# Patient Record
Sex: Male | Born: 1945 | Race: White | Hispanic: No | Marital: Married | Smoking: Never smoker
Health system: Southern US, Community
[De-identification: ages and names within clinical notes are randomized; demographics above are authoritative.]

## PROBLEM LIST (undated history)

## (undated) DIAGNOSIS — E785 Hyperlipidemia, unspecified: Secondary | ICD-10-CM

## (undated) DIAGNOSIS — E1140 Type 2 diabetes mellitus with diabetic neuropathy, unspecified: Secondary | ICD-10-CM

## (undated) DIAGNOSIS — I219 Acute myocardial infarction, unspecified: Secondary | ICD-10-CM

## (undated) DIAGNOSIS — I10 Essential (primary) hypertension: Secondary | ICD-10-CM

## (undated) DIAGNOSIS — F32A Depression, unspecified: Secondary | ICD-10-CM

## (undated) DIAGNOSIS — I2510 Atherosclerotic heart disease of native coronary artery without angina pectoris: Secondary | ICD-10-CM

## (undated) DIAGNOSIS — E113599 Type 2 diabetes mellitus with proliferative diabetic retinopathy without macular edema, unspecified eye: Secondary | ICD-10-CM

## (undated) DIAGNOSIS — I739 Peripheral vascular disease, unspecified: Secondary | ICD-10-CM

## (undated) DIAGNOSIS — H3320 Serous retinal detachment, unspecified eye: Secondary | ICD-10-CM

## (undated) DIAGNOSIS — Z8619 Personal history of other infectious and parasitic diseases: Secondary | ICD-10-CM

## (undated) DIAGNOSIS — F329 Major depressive disorder, single episode, unspecified: Secondary | ICD-10-CM

## (undated) DIAGNOSIS — M869 Osteomyelitis, unspecified: Secondary | ICD-10-CM

## (undated) DIAGNOSIS — E118 Type 2 diabetes mellitus with unspecified complications: Secondary | ICD-10-CM

## (undated) HISTORY — DX: Depression, unspecified: F32.A

## (undated) HISTORY — PX: AMPUTATION OF REPLICATED TOES: SHX1136

## (undated) HISTORY — DX: Hyperlipidemia, unspecified: E78.5

## (undated) HISTORY — DX: Type 2 diabetes mellitus with diabetic neuropathy, unspecified: E11.40

## (undated) HISTORY — DX: Essential (primary) hypertension: I10

## (undated) HISTORY — DX: Acute myocardial infarction, unspecified: I21.9

## (undated) HISTORY — PX: RETINAL DETACHMENT SURGERY: SHX105

## (undated) HISTORY — PX: CARDIAC CATHETERIZATION: SHX172

## (undated) HISTORY — DX: Type 2 diabetes mellitus with unspecified complications: E11.8

## (undated) HISTORY — DX: Atherosclerotic heart disease of native coronary artery without angina pectoris: I25.10

## (undated) HISTORY — PX: OTHER SURGICAL HISTORY: SHX169

## (undated) HISTORY — DX: Osteomyelitis, unspecified: M86.9

## (undated) HISTORY — DX: Major depressive disorder, single episode, unspecified: F32.9

## (undated) HISTORY — DX: Type 2 diabetes mellitus with proliferative diabetic retinopathy without macular edema, unspecified eye: E11.3599

## (undated) HISTORY — PX: CORONARY ANGIOPLASTY: SHX604

## (undated) HISTORY — DX: Serous retinal detachment, unspecified eye: H33.20

## (undated) HISTORY — DX: Personal history of other infectious and parasitic diseases: Z86.19

## (undated) HISTORY — DX: Peripheral vascular disease, unspecified: I73.9

---

## 1983-05-31 HISTORY — PX: OTHER SURGICAL HISTORY: SHX169

## 1996-05-30 HISTORY — PX: CORONARY ARTERY BYPASS GRAFT: SHX141

## 2003-12-12 ENCOUNTER — Other Ambulatory Visit: Payer: Self-pay

## 2004-05-01 ENCOUNTER — Inpatient Hospital Stay: Payer: Self-pay

## 2004-05-05 ENCOUNTER — Other Ambulatory Visit: Payer: Self-pay

## 2004-05-17 ENCOUNTER — Other Ambulatory Visit: Payer: Self-pay

## 2004-05-17 ENCOUNTER — Inpatient Hospital Stay: Payer: Self-pay | Admitting: Internal Medicine

## 2004-06-22 ENCOUNTER — Ambulatory Visit: Payer: Self-pay | Admitting: Podiatry

## 2004-07-07 ENCOUNTER — Ambulatory Visit: Payer: Self-pay

## 2005-08-01 ENCOUNTER — Ambulatory Visit: Payer: Self-pay | Admitting: Family Medicine

## 2006-03-27 ENCOUNTER — Ambulatory Visit: Payer: Self-pay | Admitting: Internal Medicine

## 2006-04-11 ENCOUNTER — Ambulatory Visit: Payer: Self-pay | Admitting: Internal Medicine

## 2006-05-03 ENCOUNTER — Ambulatory Visit: Payer: Self-pay | Admitting: Internal Medicine

## 2006-05-30 ENCOUNTER — Ambulatory Visit: Payer: Self-pay | Admitting: Internal Medicine

## 2006-07-07 ENCOUNTER — Inpatient Hospital Stay: Payer: Self-pay | Admitting: Internal Medicine

## 2006-07-07 ENCOUNTER — Other Ambulatory Visit: Payer: Self-pay

## 2007-08-13 ENCOUNTER — Other Ambulatory Visit: Payer: Self-pay

## 2007-08-13 ENCOUNTER — Inpatient Hospital Stay: Payer: Self-pay | Admitting: Internal Medicine

## 2009-05-14 ENCOUNTER — Emergency Department: Payer: Self-pay | Admitting: Emergency Medicine

## 2009-12-24 ENCOUNTER — Ambulatory Visit: Payer: Self-pay | Admitting: Podiatry

## 2012-07-18 ENCOUNTER — Other Ambulatory Visit: Payer: Self-pay | Admitting: Podiatry

## 2012-07-22 LAB — WOUND CULTURE

## 2012-09-19 DIAGNOSIS — H35373 Puckering of macula, bilateral: Secondary | ICD-10-CM | POA: Insufficient documentation

## 2012-09-19 DIAGNOSIS — Z961 Presence of intraocular lens: Secondary | ICD-10-CM | POA: Insufficient documentation

## 2013-05-18 ENCOUNTER — Inpatient Hospital Stay: Payer: Self-pay | Admitting: Student

## 2013-05-18 LAB — CBC WITH DIFFERENTIAL/PLATELET
Basophil #: 0.1 10*3/uL (ref 0.0–0.1)
Eosinophil #: 0.1 10*3/uL (ref 0.0–0.7)
MCHC: 32.4 g/dL (ref 32.0–36.0)
Monocyte #: 0.5 x10 3/mm (ref 0.2–1.0)
Neutrophil #: 5.7 10*3/uL (ref 1.4–6.5)
Platelet: 249 10*3/uL (ref 150–440)
RBC: 4.19 10*6/uL — ABNORMAL LOW (ref 4.40–5.90)
RDW: 12.8 % (ref 11.5–14.5)
WBC: 8.4 10*3/uL (ref 3.8–10.6)

## 2013-05-18 LAB — BASIC METABOLIC PANEL
Calcium, Total: 8.5 mg/dL (ref 8.5–10.1)
Chloride: 95 mmol/L — ABNORMAL LOW (ref 98–107)
Co2: 28 mmol/L (ref 21–32)
EGFR (African American): >60
Sodium: 129 mmol/L — ABNORMAL LOW (ref 136–145)

## 2013-05-19 LAB — CBC WITH DIFFERENTIAL/PLATELET
Basophil %: 1 %
Eosinophil #: 0.3 10*3/uL (ref 0.0–0.7)
Eosinophil %: 3.9 %
HCT: 34.9 % — ABNORMAL LOW (ref 40.0–52.0)
HGB: 11.9 g/dL — ABNORMAL LOW (ref 13.0–18.0)
Lymphocyte #: 2.3 10*3/uL (ref 1.0–3.6)
MCH: 29.9 pg (ref 26.0–34.0)
MCHC: 34.1 g/dL (ref 32.0–36.0)
MCV: 88 fL (ref 80–100)
Neutrophil #: 4.5 10*3/uL (ref 1.4–6.5)
Neutrophil %: 57.9 %
Platelet: 234 10*3/uL (ref 150–440)
RBC: 3.98 10*6/uL — ABNORMAL LOW (ref 4.40–5.90)
WBC: 7.8 10*3/uL (ref 3.8–10.6)

## 2013-05-19 LAB — BASIC METABOLIC PANEL
BUN: 18 mg/dL (ref 7–18)
Osmolality: 280 (ref 275–301)
Potassium: 3.4 mmol/L — ABNORMAL LOW (ref 3.5–5.1)
Sodium: 134 mmol/L — ABNORMAL LOW (ref 136–145)

## 2013-05-20 LAB — CBC WITH DIFFERENTIAL/PLATELET
Basophil #: 0.1 10*3/uL (ref 0.0–0.1)
Eosinophil #: 0.4 10*3/uL (ref 0.0–0.7)
Lymphocyte %: 31.2 %
MCH: 29.8 pg (ref 26.0–34.0)
MCHC: 33.6 g/dL (ref 32.0–36.0)
MCV: 89 fL (ref 80–100)
Monocyte #: 0.7 x10 3/mm (ref 0.2–1.0)
Neutrophil #: 4.2 10*3/uL (ref 1.4–6.5)

## 2013-05-20 LAB — BASIC METABOLIC PANEL
Anion Gap: 5 — ABNORMAL LOW (ref 7–16)
Calcium, Total: 8.5 mg/dL (ref 8.5–10.1)
Chloride: 100 mmol/L (ref 98–107)
Co2: 29 mmol/L (ref 21–32)
EGFR (African American): >60
EGFR (Non-African Amer.): >60
Glucose: 341 mg/dL — ABNORMAL HIGH (ref 65–99)
Osmolality: 284 (ref 275–301)
Sodium: 134 mmol/L — ABNORMAL LOW (ref 136–145)

## 2013-05-20 LAB — HEMOGLOBIN A1C: Hemoglobin A1C: 11.5 % — ABNORMAL HIGH (ref 4.2–6.3)

## 2013-05-21 LAB — VANCOMYCIN, TROUGH: Vancomycin, Trough: 12 ug/mL (ref 10–20)

## 2013-05-22 LAB — PATHOLOGY REPORT

## 2013-05-23 LAB — WOUND CULTURE

## 2013-05-23 LAB — CULTURE, BLOOD (SINGLE)

## 2013-05-24 LAB — WOUND CULTURE

## 2013-06-10 ENCOUNTER — Emergency Department: Payer: Self-pay | Admitting: Emergency Medicine

## 2013-06-10 LAB — CBC
HCT: 35.6 % — ABNORMAL LOW (ref 40.0–52.0)
HGB: 12 g/dL — AB (ref 13.0–18.0)
MCH: 29.9 pg (ref 26.0–34.0)
MCHC: 33.6 g/dL (ref 32.0–36.0)
MCV: 89 fL (ref 80–100)
Platelet: 192 10*3/uL (ref 150–440)
RBC: 3.99 10*6/uL — ABNORMAL LOW (ref 4.40–5.90)
RDW: 12.5 % (ref 11.5–14.5)
WBC: 9.8 10*3/uL (ref 3.8–10.6)

## 2013-06-10 LAB — COMPREHENSIVE METABOLIC PANEL
ALBUMIN: 2.9 g/dL — AB (ref 3.4–5.0)
ALK PHOS: 81 U/L
ANION GAP: 7 (ref 7–16)
BILIRUBIN TOTAL: 0.5 mg/dL (ref 0.2–1.0)
BUN: 22 mg/dL — AB (ref 7–18)
CALCIUM: 8.6 mg/dL (ref 8.5–10.1)
Chloride: 102 mmol/L (ref 98–107)
Co2: 25 mmol/L (ref 21–32)
Creatinine: 1.1 mg/dL (ref 0.60–1.30)
EGFR (African American): >60
EGFR (Non-African Amer.): >60
Glucose: 399 mg/dL — ABNORMAL HIGH (ref 65–99)
OSMOLALITY: 288 (ref 275–301)
POTASSIUM: 4.2 mmol/L (ref 3.5–5.1)
SGOT(AST): 15 U/L (ref 15–37)
SGPT (ALT): 17 U/L (ref 12–78)
Sodium: 134 mmol/L — ABNORMAL LOW (ref 136–145)
TOTAL PROTEIN: 7 g/dL (ref 6.4–8.2)

## 2013-06-20 ENCOUNTER — Other Ambulatory Visit: Payer: Self-pay | Admitting: Podiatry

## 2013-06-24 LAB — WOUND CULTURE

## 2013-06-25 ENCOUNTER — Inpatient Hospital Stay: Payer: Self-pay | Admitting: Podiatry

## 2013-06-25 LAB — COMPREHENSIVE METABOLIC PANEL
ANION GAP: 3 — AB (ref 7–16)
AST: 16 U/L (ref 15–37)
Albumin: 2.8 g/dL — ABNORMAL LOW (ref 3.4–5.0)
Alkaline Phosphatase: 119 U/L — ABNORMAL HIGH
BILIRUBIN TOTAL: 0.2 mg/dL (ref 0.2–1.0)
BUN: 27 mg/dL — ABNORMAL HIGH (ref 7–18)
CALCIUM: 8.6 mg/dL (ref 8.5–10.1)
CREATININE: 1.66 mg/dL — AB (ref 0.60–1.30)
Chloride: 95 mmol/L — ABNORMAL LOW (ref 98–107)
Co2: 29 mmol/L (ref 21–32)
EGFR (African American): 49 — ABNORMAL LOW
EGFR (Non-African Amer.): 42 — ABNORMAL LOW
GLUCOSE: 373 mg/dL — AB (ref 65–99)
Osmolality: 276 (ref 275–301)
Potassium: 4.2 mmol/L (ref 3.5–5.1)
SGPT (ALT): 11 U/L — ABNORMAL LOW (ref 12–78)
Sodium: 127 mmol/L — ABNORMAL LOW (ref 136–145)
Total Protein: 7.4 g/dL (ref 6.4–8.2)

## 2013-06-25 LAB — CBC WITH DIFFERENTIAL/PLATELET
BASOS ABS: 0.1 10*3/uL (ref 0.0–0.1)
BASOS PCT: 0.7 %
Eosinophil #: 0.5 10*3/uL (ref 0.0–0.7)
Eosinophil %: 6.6 %
HCT: 33 % — ABNORMAL LOW (ref 40.0–52.0)
HGB: 11.3 g/dL — AB (ref 13.0–18.0)
Lymphocyte #: 2.2 10*3/uL (ref 1.0–3.6)
Lymphocyte %: 27.4 %
MCH: 30.4 pg (ref 26.0–34.0)
MCHC: 34.1 g/dL (ref 32.0–36.0)
MCV: 89 fL (ref 80–100)
MONOS PCT: 8.1 %
Monocyte #: 0.6 x10 3/mm (ref 0.2–1.0)
Neutrophil #: 4.6 10*3/uL (ref 1.4–6.5)
Neutrophil %: 57.2 %
PLATELETS: 384 10*3/uL (ref 150–440)
RBC: 3.7 10*6/uL — ABNORMAL LOW (ref 4.40–5.90)
RDW: 12.5 % (ref 11.5–14.5)
WBC: 8 10*3/uL (ref 3.8–10.6)

## 2013-06-25 LAB — PROTIME-INR
INR: 0.9
PROTHROMBIN TIME: 12.4 s (ref 11.5–14.7)

## 2013-06-25 LAB — APTT: Activated PTT: 33.5 secs (ref 23.6–35.9)

## 2013-06-25 LAB — HEMOGLOBIN A1C: Hemoglobin A1C: 10.7 % — ABNORMAL HIGH (ref 4.2–6.3)

## 2013-06-30 LAB — WOUND CULTURE

## 2013-06-30 LAB — CULTURE, BLOOD (SINGLE)

## 2013-07-09 ENCOUNTER — Telehealth: Payer: Self-pay | Admitting: Adult Health

## 2013-07-09 NOTE — Telephone Encounter (Signed)
Patient Information:  Caller Name: Grover Canavan  Phone: [phone]  Patient: Todd Clay, Todd Clay  Gender: Male  DOB: 1945-07-26  Age: 68 Years  PCP: Charolette Forward  Office Follow Up:  Does the office need to follow up with this patient?: Yes  Instructions For The Office: Please review - contact wife/Cathera at cell  [phone] as soon as possible with Appt time or instructions for care. According to office instructions:  New or transferring patients: CAN (may not) triage new or transferring pts who have not been seen in the office yet. Transferring pts from another practice (must) be seen in the office first before CAN may triage.   If pt has a new pt appt scheduled and is calling for an acute need, we may triage and schedule acute appt.  RN Note:  Disposition:  Go to ER now or to office with PCP approval. Patient says he feels so bad right now, just wanting to stay in bed - would rather have Appt in am Wed 2/11 but will bring him in of possible or will take him to ER if need be.  Symptoms  Reason For Call & Symptoms: Is changing PMDs - has Aopt 08/13/2013 for initial evaluation. But patient very ill now.  Had Left first toe after big toe amputated 05/19/2013.  Did well at first but in hospital again in mid-Jan 2015 for debriedment due to not healing.  Also has 2 fractured bones in Left foot and had Xrays today.  Has severe infection in Left foot.  Hx of heart problems, diabetes without glucose control - BS ranging so high would not register on monitor in the last 2 weeks or so low 50s.  Wanting help with new PMD to help with his care. Has vomited twice this am,  Blood sugar at 10 am was 80 after breakfast. Wife concerned he may die before she can get him help.  Reviewed Health History In EMR: Yes  Reviewed Medications In EMR: Yes  Reviewed Allergies In EMR: Yes  Reviewed Surgeries / Procedures: Yes  Date of Onset of Symptoms: 05/31/2013  Treatments Tried: surgeries listed above.  Treatments Tried  Worked: No  Guideline(s) Used:  Diabetes - Low Blood Sugar  Disposition Per Guideline:   Go to ED Now (or to Office with PCP Approval)  Reason For Disposition Reached:   Patient sounds very sick or weak to the triager  Advice Given:  N/A  Patient Will Follow Care Advice:  YES

## 2013-07-09 NOTE — Telephone Encounter (Signed)
Spoke with wife, advised to take pt to ED.

## 2013-07-24 ENCOUNTER — Inpatient Hospital Stay: Payer: Self-pay | Admitting: Internal Medicine

## 2013-07-24 LAB — CBC WITH DIFFERENTIAL/PLATELET
BASOS ABS: 0.1 10*3/uL (ref 0.0–0.1)
BASOS PCT: 0.9 %
Eosinophil #: 0.3 10*3/uL (ref 0.0–0.7)
Eosinophil %: 4.3 %
HCT: 35.7 % — AB (ref 40.0–52.0)
HGB: 11.9 g/dL — AB (ref 13.0–18.0)
LYMPHS ABS: 1.9 10*3/uL (ref 1.0–3.6)
LYMPHS PCT: 25.2 %
MCH: 29.7 pg (ref 26.0–34.0)
MCHC: 33.3 g/dL (ref 32.0–36.0)
MCV: 89 fL (ref 80–100)
MONO ABS: 0.4 x10 3/mm (ref 0.2–1.0)
Monocyte %: 5.5 %
Neutrophil #: 4.9 10*3/uL (ref 1.4–6.5)
Neutrophil %: 64.1 %
Platelet: 230 10*3/uL (ref 150–440)
RBC: 4.01 10*6/uL — ABNORMAL LOW (ref 4.40–5.90)
RDW: 13.8 % (ref 11.5–14.5)
WBC: 7.6 10*3/uL (ref 3.8–10.6)

## 2013-07-24 LAB — BASIC METABOLIC PANEL
Anion Gap: 5 — ABNORMAL LOW (ref 7–16)
BUN: 25 mg/dL — ABNORMAL HIGH (ref 7–18)
CHLORIDE: 99 mmol/L (ref 98–107)
Calcium, Total: 8.8 mg/dL (ref 8.5–10.1)
Co2: 27 mmol/L (ref 21–32)
Creatinine: 1.47 mg/dL — ABNORMAL HIGH (ref 0.60–1.30)
GFR CALC AF AMER: 56 — AB
GFR CALC NON AF AMER: 49 — AB
Glucose: 257 mg/dL — ABNORMAL HIGH (ref 65–99)
Osmolality: 276 (ref 275–301)
POTASSIUM: 5.1 mmol/L (ref 3.5–5.1)
Sodium: 131 mmol/L — ABNORMAL LOW (ref 136–145)

## 2013-07-25 LAB — BASIC METABOLIC PANEL
Anion Gap: 4 — ABNORMAL LOW (ref 7–16)
BUN: 21 mg/dL — ABNORMAL HIGH (ref 7–18)
CALCIUM: 8.4 mg/dL — AB (ref 8.5–10.1)
CHLORIDE: 101 mmol/L (ref 98–107)
CO2: 30 mmol/L (ref 21–32)
Creatinine: 1.43 mg/dL — ABNORMAL HIGH (ref 0.60–1.30)
GFR CALC AF AMER: 58 — AB
GFR CALC NON AF AMER: 50 — AB
GLUCOSE: 176 mg/dL — AB (ref 65–99)
OSMOLALITY: 277 (ref 275–301)
Potassium: 4.1 mmol/L (ref 3.5–5.1)
Sodium: 135 mmol/L — ABNORMAL LOW (ref 136–145)

## 2013-07-25 LAB — CBC WITH DIFFERENTIAL/PLATELET
BASOS ABS: 0 10*3/uL (ref 0.0–0.1)
Basophil %: 0.6 %
Eosinophil #: 0.5 10*3/uL (ref 0.0–0.7)
Eosinophil %: 7.5 %
HCT: 32.8 % — ABNORMAL LOW (ref 40.0–52.0)
HGB: 11.1 g/dL — AB (ref 13.0–18.0)
Lymphocyte #: 1.9 10*3/uL (ref 1.0–3.6)
Lymphocyte %: 27.3 %
MCH: 29.9 pg (ref 26.0–34.0)
MCHC: 33.8 g/dL (ref 32.0–36.0)
MCV: 88 fL (ref 80–100)
MONOS PCT: 6.6 %
Monocyte #: 0.5 x10 3/mm (ref 0.2–1.0)
Neutrophil #: 4 10*3/uL (ref 1.4–6.5)
Neutrophil %: 58 %
PLATELETS: 197 10*3/uL (ref 150–440)
RBC: 3.71 10*6/uL — AB (ref 4.40–5.90)
RDW: 13.5 % (ref 11.5–14.5)
WBC: 6.9 10*3/uL (ref 3.8–10.6)

## 2013-07-25 LAB — SEDIMENTATION RATE: Erythrocyte Sed Rate: 38 mm/hr — ABNORMAL HIGH (ref 0–20)

## 2013-07-28 LAB — WOUND CULTURE

## 2013-07-29 LAB — PATHOLOGY REPORT

## 2013-07-29 LAB — CULTURE, BLOOD (SINGLE)

## 2013-08-13 ENCOUNTER — Ambulatory Visit (INDEPENDENT_AMBULATORY_CARE_PROVIDER_SITE_OTHER): Payer: Commercial Managed Care - HMO | Admitting: Adult Health

## 2013-08-13 ENCOUNTER — Encounter: Payer: Self-pay | Admitting: Adult Health

## 2013-08-13 ENCOUNTER — Encounter (INDEPENDENT_AMBULATORY_CARE_PROVIDER_SITE_OTHER): Payer: Self-pay

## 2013-08-13 ENCOUNTER — Other Ambulatory Visit: Payer: Self-pay | Admitting: *Deleted

## 2013-08-13 VITALS — BP 96/59 | HR 64 | Temp 97.7°F | Resp 14 | Ht 67.25 in | Wt 179.0 lb

## 2013-08-13 DIAGNOSIS — I2510 Atherosclerotic heart disease of native coronary artery without angina pectoris: Secondary | ICD-10-CM

## 2013-08-13 DIAGNOSIS — R5383 Other fatigue: Secondary | ICD-10-CM

## 2013-08-13 DIAGNOSIS — E118 Type 2 diabetes mellitus with unspecified complications: Secondary | ICD-10-CM

## 2013-08-13 DIAGNOSIS — Z1211 Encounter for screening for malignant neoplasm of colon: Secondary | ICD-10-CM | POA: Insufficient documentation

## 2013-08-13 DIAGNOSIS — E1065 Type 1 diabetes mellitus with hyperglycemia: Secondary | ICD-10-CM | POA: Insufficient documentation

## 2013-08-13 DIAGNOSIS — R5381 Other malaise: Secondary | ICD-10-CM

## 2013-08-13 DIAGNOSIS — IMO0002 Reserved for concepts with insufficient information to code with codable children: Secondary | ICD-10-CM | POA: Insufficient documentation

## 2013-08-13 DIAGNOSIS — Z7689 Persons encountering health services in other specified circumstances: Secondary | ICD-10-CM | POA: Insufficient documentation

## 2013-08-13 DIAGNOSIS — Z7189 Other specified counseling: Secondary | ICD-10-CM

## 2013-08-13 MED ORDER — SIMVASTATIN 80 MG PO TABS: 80.0000 mg | ORAL_TABLET | Freq: Every day | ORAL | Status: DC

## 2013-08-13 MED ORDER — INSULIN GLARGINE 100 UNIT/ML ~~LOC~~ SOLN: 40.0000 [IU] | Freq: Every day | SUBCUTANEOUS | Status: DC

## 2013-08-13 MED ORDER — METOPROLOL TARTRATE 25 MG PO TABS: 25.0000 mg | ORAL_TABLET | Freq: Two times a day (BID) | ORAL | Status: DC

## 2013-08-13 MED ORDER — CLOPIDOGREL BISULFATE 75 MG PO TABS: 75.0000 mg | ORAL_TABLET | Freq: Every day | ORAL | Status: DC

## 2013-08-13 MED ORDER — TORSEMIDE 20 MG PO TABS: 20.0000 mg | ORAL_TABLET | Freq: Every day | ORAL | Status: DC

## 2013-08-13 MED ORDER — POTASSIUM CHLORIDE CRYS ER 20 MEQ PO TBCR: 20.0000 meq | EXTENDED_RELEASE_TABLET | Freq: Once | ORAL | Status: DC

## 2013-08-13 MED ORDER — GABAPENTIN 300 MG PO CAPS: 900.0000 mg | ORAL_CAPSULE | Freq: Three times a day (TID) | ORAL | Status: DC

## 2013-08-13 MED ORDER — POTASSIUM CHLORIDE CRYS ER 20 MEQ PO TBCR: 20.0000 meq | EXTENDED_RELEASE_TABLET | Freq: Every day | ORAL | Status: DC

## 2013-08-13 NOTE — Progress Notes (Signed)
Pre visit review using our clinic review tool, if applicable. No additional management support is needed unless otherwise documented below in the visit note. 

## 2013-08-13 NOTE — Telephone Encounter (Signed)
Pharmacy Note:  Clarify directions for Potassium Chloride SA

## 2013-08-13 NOTE — Progress Notes (Signed)
Patient ID: Todd Clay, male   DOB: Jul 24, 1945, 68 y.o.   MRN: [id number]    Subjective:    Patient ID: Todd Clay, male    DOB: 1945/09/17, 68 y.o.   MRN: [id number]  HPI  Pt is a pleasant 68 y/o male who presents to clinic to establish care. Pt has multiple medical problems which are listed below including DM type 2 with complications, diabetic neuropathy, HTN, HLD, CAD s/p CABG x 1 ([facility] by Dr. Evelina Dun), MI x 3 who presents to clinic to establish care. He was previously followed by Dr. Lavera Guise. Will request records. Pt reports that he does not have a local cardiologist. Recent surgery for osteomyelitis of left 2nd and 3rd metatarsal s/p amputation. He is followed by Dr. Vickki Muff and Dr. Ola Spurr (ID). Pt is currently being followed by [facility] for IV antibiotics. He has a PICC line in his right arm. He is receiving Ertapenem 1g IV q24h.    Past Medical History  Diagnosis Date  . History of chickenpox   . Diabetes mellitus with complication   . CAD (coronary artery disease)   . Retinal detachment     OD  . Osteomyelitis     Left 2nd and 3rd metatarsal  . Diabetic neuropathy   . PVD (peripheral vascular disease)   . HTN (hypertension)   . HLD (hyperlipidemia)   . Depression   . Proliferative diabetic retinopathy     OU  . MI (myocardial infarction)     x3     Past Surgical History  Procedure Laterality Date  . Coronary artery bypass graft  1998    x1 vessel  . 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 Stents      x 5   . Retinal detachment surgery      Laser  . Vitreous retinal surgery  1985    Laser tx OU     Family History  Problem Relation Age of Onset  . Kidney disease Mother     Kidney failure  . Heart disease Father     CAD  . Cancer Brother     Head & Neck Tumor     History   Social History  . Marital Status: Married    Spouse Name: N/A    Number of Children: N/A  . Years of Education: N/A   Occupational  History  . Not on file.   Social History Main Topics  . Smoking status: Never Smoker   . Smokeless tobacco: Not on file  . Alcohol Use: No  . Drug Use: No  . Sexual Activity: Not on file   Other Topics Concern  . Not on file   Social History Narrative  . No narrative on file     Review of Systems  Constitutional: Positive for fatigue.  HENT: Negative.   Eyes: Negative.   Respiratory: Negative.   Cardiovascular: Negative.   Gastrointestinal: Negative.   Endocrine: Negative.   Genitourinary: Negative.   Musculoskeletal: Negative.        Amputation of left 2nd and 3rd metatarsal.   Skin: Negative.   Allergic/Immunologic: Negative.   Neurological: Positive for numbness (tingling - currently on gabapentin).  Hematological: Negative.   Psychiatric/Behavioral: Negative.        Objective:  There were no vitals taken for this visit.   Physical Exam  Constitutional: He is oriented to person, place, and time. He appears well-developed and well-nourished. No  distress.  HENT:  Head: Normocephalic and atraumatic.  Eyes: Conjunctivae and EOM are normal. Pupils are equal, round, and reactive to light.  Neck: Normal range of motion. Neck supple.  Cardiovascular: Normal rate, regular rhythm, normal heart sounds and intact distal pulses.  Exam reveals no gallop and no friction rub.   No murmur heard. Pulmonary/Chest: Effort normal and breath sounds normal. No respiratory distress. He has no wheezes. He has no rales.  Musculoskeletal: Normal range of motion. He exhibits no edema and no tenderness.  Ambulating with walker  Neurological: He is alert and oriented to person, place, and time. He has normal reflexes. Coordination normal.  Skin: Skin is warm and dry.  Psychiatric: He has a normal mood and affect. His behavior is normal. Judgment and thought content normal.       Assessment & Plan:   1. Diabetes mellitus with complication Insulin dependent. Refill medication. Check  blood glucose bid. AM readings around 100. PM readings around 203-210. Takes regular insulin for blood glucose above 200. Usually 3 units. Retinopathy followed by Opthalmology yearly. Diabetic neuropathy controlled on gabapentin. Pt had extensive lab work done while hospitalized. Will request labs. Want to see how his kidney function is. He reports they told him in the hospital it was "slightly elevated". Review. May need referral to Nephrology.  2. Encounter to establish care Reviewed medical, surgical, family, social hx with pt. Request records from previous PCP. Request recent labs.  3. CAD (coronary artery disease) Pt is s/p MI x 3, CABG x 1 at [facility], stents x 5. Refer to local cardiologist. Will refer to Dr. Fletcher Anon or Dr. Candis Musa  - Ambulatory referral to Cardiology  4. Fatigue Multifactorial given recent events - osteomyelitis, surgery, medical problems. B/P is on the low side. May need to decrease metoprolol slightly. Will wait for input from cardiology. Request recent labs to review. ?Anemia, thyroid.

## 2013-08-16 ENCOUNTER — Encounter: Payer: Self-pay | Admitting: Emergency Medicine

## 2013-08-21 ENCOUNTER — Telehealth: Payer: Self-pay | Admitting: *Deleted

## 2013-08-21 NOTE — Telephone Encounter (Signed)
Called pt to inquire about blood sugar readings, pt stated that they are low in the morning and high in the evenings.  Yesterday morning was 89, Pt stated that an average number for the evenings is sometimes more than 200.  Pt stated that he doesn't feel like keeping a log.

## 2013-08-22 ENCOUNTER — Telehealth: Payer: Self-pay

## 2013-08-22 NOTE — Telephone Encounter (Signed)
Relevant patient education assigned to patient using Emmi. ° °

## 2013-08-26 ENCOUNTER — Ambulatory Visit (INDEPENDENT_AMBULATORY_CARE_PROVIDER_SITE_OTHER): Payer: Commercial Managed Care - HMO | Admitting: Cardiovascular Disease

## 2013-08-26 ENCOUNTER — Encounter: Payer: Self-pay | Admitting: Cardiovascular Disease

## 2013-08-26 VITALS — BP 92/58 | HR 83 | Ht 68.0 in | Wt 178.5 lb

## 2013-08-26 DIAGNOSIS — I2510 Atherosclerotic heart disease of native coronary artery without angina pectoris: Secondary | ICD-10-CM

## 2013-08-26 DIAGNOSIS — I951 Orthostatic hypotension: Secondary | ICD-10-CM

## 2013-08-26 DIAGNOSIS — E118 Type 2 diabetes mellitus with unspecified complications: Secondary | ICD-10-CM

## 2013-08-26 DIAGNOSIS — Z951 Presence of aortocoronary bypass graft: Secondary | ICD-10-CM | POA: Insufficient documentation

## 2013-08-26 DIAGNOSIS — E785 Hyperlipidemia, unspecified: Secondary | ICD-10-CM

## 2013-08-26 DIAGNOSIS — R5383 Other fatigue: Secondary | ICD-10-CM

## 2013-08-26 DIAGNOSIS — R5381 Other malaise: Secondary | ICD-10-CM

## 2013-08-26 NOTE — Assessment & Plan Note (Signed)
We have encouraged continued  careful diet management, medication management with primary care. He does report recent low sugars. Perhaps need a lower dose insulin in the morning

## 2013-08-26 NOTE — Progress Notes (Signed)
Patient ID: Todd Clay, male    DOB: [birth year], 68 y.o.   MRN: [id number]  HPI Comments: Todd Clay is a pleasant 68 year old gentleman with history of CAD, bypass surgery December 1997 with LIMA to the LAD at [facility], several catheterizations since that time with stents placed, stent to the left circumflex and RCA in 2005, history of poorly controlled diabetes, with PAD, recent toe indications (staph) for ostiomyelitis requiring PICC line and long-term antibiotics, who presents to establish care in our clinic.   He denies any active chest pain or shortness of breath on today's visit. He has been relatively sedentary given his healing amputation of his toe. He reports that in the past he has not had significant chest pain prior to stent placement. He reports the blockages were found but he does not remember how. He does feel washed out, denies any lightheadedness or dizziness. Just feels very tired. He reports that this morning his sugars were very low. He is trying to eat better eared sugars were in the 30s.  Previously seen by Dr. Ola Spurr for infectious disease  In the office today blood pressures running low. Also running low when recently seen by primary care, systolics in the 23J. He is taking Augmentin now for his foot/toe.   Cardiac catheterization in 2008 at [facility] showed 99% proximal RCA disease with endeavor DES placed 2.5 x 15 mm, also with 60% distal RCA disease, 100% OM1 disease followed by 50%, 80% proximal LAD disease, 100% followed by 90% mid LAD disease, 100% diagonal disease Normal ejection fraction at that time estimated greater than 60%  Prior cardiac catheterization in July 2005 details 60% proximal RCA followed by 50%, 90% and 70% distal RCA disease 90% mid left circumflex disease, 90% OM 3 disease, 70% proximal LAD disease extending to the mid vessel, 100% mid LAD disease, 70% distal LAD disease  Cardiac catheterization July 2000 with 70% proximal RCA disease, 90%  mid RCA disease, 70% distal RCA disease, 70% proximal left circumflex disease, 70% OM1 disease, 100% OM 2 disease, 70% proximal LAD to mid LAD disease, 70% mid and distal LAD disease, 100% diagonal disease   Recent lab work in the hospital 07/26/2013 shows creatinine 1.43, BUN 21, potassium 4.1  EKG today shows normal sinus rhythm with rate 83 beats per minute, bigeminal pattern with PVCs, ST and T wave abnormality through the anterior precordial leads, inferior leads   Outpatient Encounter Prescriptions as of 08/26/2013  Medication Sig  . amoxicillin-clavulanate (AUGMENTIN) 875-125 MG per tablet Take 1 tablet by mouth 2 (two) times daily.  Marland Kitchen aspirin 325 MG tablet Take 325 mg by mouth daily.  . clopidogrel (PLAVIX) 75 MG tablet Take 1 tablet (75 mg total) by mouth daily with breakfast.  . gabapentin (NEURONTIN) 300 MG capsule Take 3 capsules (900 mg total) by mouth 3 (three) times daily.  . insulin glargine (LANTUS) 100 UNIT/ML injection Inject 0.4 mLs (40 Units total) into the skin daily.  . insulin regular (NOVOLIN R,HUMULIN R) 100 units/mL injection 3 Units at bedtime as needed for high blood sugar.  . metoprolol tartrate (LOPRESSOR) 25 MG tablet Take 1 tablet (25 mg total) by mouth 2 (two) times daily.  . potassium chloride SA (K-DUR,KLOR-CON) 20 MEQ tablet Take 1 tablet (20 mEq total) by mouth daily.  . simvastatin (ZOCOR) 80 MG tablet Take 1 tablet (80 mg total) by mouth daily at 6 PM.  . torsemide (DEMADEX) 20 MG tablet Take 1 tablet (20 mg total) by mouth  daily.    Review of Systems  Constitutional: Negative.   HENT: Negative.   Eyes: Negative.   Respiratory: Negative.   Cardiovascular: Negative.   Gastrointestinal: Negative.   Endocrine: Negative.   Musculoskeletal: Negative.   Skin: Negative.   Allergic/Immunologic: Negative.   Neurological: Negative.   Hematological: Negative.   Psychiatric/Behavioral: Negative.   All other systems reviewed and are negative.    BP  92/58  Pulse 83  Ht 5\' 8"  (1.727 m)  Wt 178 lb 8 oz (80.967 kg)  BMI 27.15 kg/m2  Physical Exam  Nursing note and vitals reviewed. Constitutional: He is oriented to person, place, and time. He appears well-developed and well-nourished.  HENT:  Head: Normocephalic.  Nose: Nose normal.  Mouth/Throat: Oropharynx is clear and moist.  Eyes: Conjunctivae are normal. Pupils are equal, round, and reactive to light.  Neck: Normal range of motion. Neck supple. No JVD present.  Cardiovascular: Normal rate, regular rhythm, S1 normal, S2 normal, normal heart sounds and intact distal pulses.  Exam reveals no gallop and no friction rub.   No murmur heard. Pulses:      Carotid pulses are 2+ on the right side, and 2+ on the left side.      Radial pulses are 1+ on the right side, and 1+ on the left side.       Dorsalis pedis pulses are 0 on the right side, and 0 on the left side.       Posterior tibial pulses are 0 on the right side, and 0 on the left side.  Pulmonary/Chest: Effort normal and breath sounds normal. No respiratory distress. He has no wheezes. He has no rales. He exhibits no tenderness.  Abdominal: Soft. Bowel sounds are normal. He exhibits no distension. There is no tenderness.  Musculoskeletal: Normal range of motion. He exhibits no edema and no tenderness.  Lymphadenopathy:    He has no cervical adenopathy.  Neurological: He is alert and oriented to person, place, and time. Coordination normal.  Skin: Skin is warm and dry. No rash noted. No erythema.  Psychiatric: He has a normal mood and affect. His behavior is normal. Judgment and thought content normal.      Assessment and Plan

## 2013-08-26 NOTE — Patient Instructions (Signed)
Blood pressure is low Please take torsemide and potassium every other day  If blood pressures continue to run low,  Cut the metoprolol in 1/2 twice a day  Please call us if you have new issues that need to be addressed before your next appt.  Your physician wants you to follow-up in: 3 month.

## 2013-08-26 NOTE — Assessment & Plan Note (Addendum)
[place] to the LAD in 1997 per the notes from [place]. Numerous stents since that time take 3 to the RCA distal, proximal RCA, left circumflex

## 2013-08-26 NOTE — Assessment & Plan Note (Signed)
Recent symptoms of fatigue. Uncertain if he has recurrent infection though he reports his toe is healing well. Blood pressure is low today. Uncertain if this is secondary to peripheral vascular disease. Recent blood work in February 2015 appeared mildly dehydrated. He is uncertain why he is on torsemide daily, denies having leg edema or fluid retention. No recent echocardiogram to evaluate EF. We have suggested he take torsemide with potassium every other day. He does not want echocardiogram at this time, does not want repeat lab work.

## 2013-08-26 NOTE — Assessment & Plan Note (Signed)
We'll try to obtain his most recent lipid panel. Goal LDL less than 70. Stressed the importance of good diabetes control

## 2013-08-26 NOTE — Assessment & Plan Note (Addendum)
Prior records requested, reviewed. Surgical report from 1997 reviewed, 3 cardiac catheterizations reviewed including interventions detailed. Currently with no symptoms of angina. No further workup at this time. Continue current medication regimen. If he has any symptoms of shortness of breath or chest pain, would schedule a lexiscan Myoview.

## 2013-08-26 NOTE — Assessment & Plan Note (Signed)
We have recommended he take torsemide with potassium every other day. I'm concerned about mild dehydration. Given his low blood pressure, this does raise the concern of recurrent infection. Suggested his wife closely track his blood pressure at home. Unable to exclude peripheral arterial disease as cause of his low blood pressure. If no improvement in his numbers, ultrasounds of his subclavian arteries could be performed.

## 2013-11-11 ENCOUNTER — Telehealth: Payer: Self-pay

## 2013-11-11 NOTE — Telephone Encounter (Signed)
Pt wife called and states on Sat pt "couldnt stand up, just started sliding down, and happened again on Sunday". States his blood sugar was over 300, states his BP on Sunday 136/76 on right arm, left arm would not register. Please call.

## 2013-11-11 NOTE — Telephone Encounter (Signed)
Spoke w/ Grover Canavan.  She reports that pt went out to eat on Sat and "felt funny" when he was getting out of the car. She states that she practically had to drag him to his seat.  She thought it was his BG, she ordered him a glass of sweet tea, which sent his BG >300. States that he had a similar episode yesterday.  BP right arm 136/76, unable to obtain pressure in left arm.  She states that she had an electronic meter, her sister in law is a [employer] scientist and she was able to palpate pulse in wrist, but did not have stethoscope to obtain manual BP.  She is concerned, as pt has h/o "silent MIs" and she read that the discrepancy in BP can be r/t his heart.  Offered pt appt to come in for nurse visit for EKG and BP check, or to see Dr. Rockey Situ or Ignacia Bayley, NP. Advised her to call 911 or go to nearest ED if she is concerned about pt's sx.  She reports that she would prefer for pt to keep appt 11/18/13 w/ Dr. Rockey Situ, but will call if pt needs assistance before that time.

## 2013-11-18 ENCOUNTER — Ambulatory Visit (INDEPENDENT_AMBULATORY_CARE_PROVIDER_SITE_OTHER): Payer: Commercial Managed Care - HMO | Admitting: Adult Health

## 2013-11-18 ENCOUNTER — Encounter: Payer: Self-pay | Admitting: Adult Health

## 2013-11-18 ENCOUNTER — Ambulatory Visit (INDEPENDENT_AMBULATORY_CARE_PROVIDER_SITE_OTHER): Payer: Commercial Managed Care - HMO | Admitting: Cardiovascular Disease

## 2013-11-18 ENCOUNTER — Encounter: Payer: Self-pay | Admitting: Cardiovascular Disease

## 2013-11-18 VITALS — BP 120/60 | HR 83 | Ht 68.0 in | Wt 178.0 lb

## 2013-11-18 VITALS — BP 106/66 | HR 77 | Temp 98.4°F | Resp 14 | Wt 177.5 lb

## 2013-11-18 DIAGNOSIS — Z951 Presence of aortocoronary bypass graft: Secondary | ICD-10-CM

## 2013-11-18 DIAGNOSIS — IMO0002 Reserved for concepts with insufficient information to code with codable children: Secondary | ICD-10-CM

## 2013-11-18 DIAGNOSIS — I25810 Atherosclerosis of coronary artery bypass graft(s) without angina pectoris: Secondary | ICD-10-CM

## 2013-11-18 DIAGNOSIS — IMO0001 Reserved for inherently not codable concepts without codable children: Secondary | ICD-10-CM

## 2013-11-18 DIAGNOSIS — E785 Hyperlipidemia, unspecified: Secondary | ICD-10-CM

## 2013-11-18 DIAGNOSIS — E1165 Type 2 diabetes mellitus with hyperglycemia: Secondary | ICD-10-CM

## 2013-11-18 DIAGNOSIS — I951 Orthostatic hypotension: Secondary | ICD-10-CM

## 2013-11-18 DIAGNOSIS — Z8739 Personal history of other diseases of the musculoskeletal system and connective tissue: Secondary | ICD-10-CM

## 2013-11-18 DIAGNOSIS — E118 Type 2 diabetes mellitus with unspecified complications: Secondary | ICD-10-CM

## 2013-11-18 LAB — COMPREHENSIVE METABOLIC PANEL
ALT: 15 U/L (ref 0–53)
AST: 22 U/L (ref 0–37)
Albumin: 4 g/dL (ref 3.5–5.2)
Alkaline Phosphatase: 77 U/L (ref 39–117)
BILIRUBIN TOTAL: 0.5 mg/dL (ref 0.2–1.2)
BUN: 20 mg/dL (ref 6–23)
CALCIUM: 9 mg/dL (ref 8.4–10.5)
CHLORIDE: 100 meq/L (ref 96–112)
CO2: 30 meq/L (ref 19–32)
Creatinine, Ser: 1.1 mg/dL (ref 0.4–1.5)
GFR: 73.13 mL/min (ref >60.00)
GLUCOSE: 125 mg/dL — AB (ref 70–99)
Potassium: 4.4 mEq/L (ref 3.5–5.1)
SODIUM: 138 meq/L (ref 135–145)
TOTAL PROTEIN: 7.2 g/dL (ref 6.0–8.3)

## 2013-11-18 LAB — CBC WITH DIFFERENTIAL/PLATELET
BASOS ABS: 0 10*3/uL (ref 0.0–0.1)
Basophils Relative: 0.3 % (ref 0.0–3.0)
EOS PCT: 4.3 % (ref 0.0–5.0)
Eosinophils Absolute: 0.3 10*3/uL (ref 0.0–0.7)
HEMATOCRIT: 40.9 % (ref 39.0–52.0)
Hemoglobin: 13.7 g/dL (ref 13.0–17.0)
LYMPHS ABS: 3.2 10*3/uL (ref 0.7–4.0)
LYMPHS PCT: 42.5 % (ref 12.0–46.0)
MCHC: 33.5 g/dL (ref 30.0–36.0)
MCV: 89.7 fl (ref 78.0–100.0)
MONOS PCT: 2.1 % — AB (ref 3.0–12.0)
Monocytes Absolute: 0.2 10*3/uL (ref 0.1–1.0)
Neutro Abs: 3.8 10*3/uL (ref 1.4–7.7)
Neutrophils Relative %: 50.8 % (ref 43.0–77.0)
Platelets: 268 10*3/uL (ref 150.0–400.0)
RBC: 4.56 Mil/uL (ref 4.22–5.81)
RDW: 13.2 % (ref 11.5–15.5)
WBC: 7.5 10*3/uL (ref 4.0–10.5)

## 2013-11-18 LAB — MICROALBUMIN / CREATININE URINE RATIO
Creatinine,U: 52.7 mg/dL
Microalb Creat Ratio: 3.8 mg/g (ref 0.0–30.0)
Microalb, Ur: 2 mg/dL — ABNORMAL HIGH (ref 0.0–1.9)

## 2013-11-18 LAB — HEMOGLOBIN A1C: Hgb A1c MFr Bld: 10.4 % — ABNORMAL HIGH (ref 4.6–6.5)

## 2013-11-18 NOTE — Assessment & Plan Note (Signed)
Currently with no symptoms of angina. No further workup at this time. Continue current medication regimen. 

## 2013-11-18 NOTE — Assessment & Plan Note (Signed)
We have encouraged continued exercise, careful diet management in an effort to lose weight. Managed by primary care 

## 2013-11-18 NOTE — Assessment & Plan Note (Signed)
Recommended that he stay on his simvastatin 

## 2013-11-18 NOTE — Assessment & Plan Note (Signed)
Blood pressure is mildly improved today compared to his prior clinic visit. 276 systolic supine down to 147 with standing.  Is not symptomatic today. We have recommended he change the torsemide to every other day, changes metoprolol to 12.5 mg twice a day

## 2013-11-18 NOTE — Patient Instructions (Addendum)
Blood pressure is running low   Take torsemide on Monday, Wednesday and  Friday (with potassium) Please monitor your weight  Call the office for >5 pounds weight  Take 1/2 metoprolol twice a day everyday  We will schedule a carotid ultrasound  Please call us if you have new issues that need to be addressed before your next appt.  Your physician wants you to follow-up in: 3 months.  You will receive a reminder letter in the mail two months in advance. If you don't receive a letter, please call our office to schedule the follow-up appointment.

## 2013-11-18 NOTE — Progress Notes (Signed)
Patient ID: Todd Clay, male    DOB: 08-12-1945, 68 y.o.   MRN: [id number]  HPI Comments: Todd Clay is a pleasant 68 year old gentleman with history of CAD, bypass surgery December 1997 with LIMA to the LAD at [facility], several catheterizations since that time with stents placed, stent to the left circumflex and RCA in 2005, history of poorly controlled diabetes, with PAD, recent toe indications (staph) for ostiomyelitis requiring PICC line and long-term antibiotics, who presents for routine followup  On his last clinic visit, we suggested taking torsemide with potassium every other day. Systolic pressures were in the 90s. On today's visit, he reports that he continues to take torsemide daily. He's taking metoprolol every other day. He was confused about his medications. He continues to have episodes of lightheadedness, near syncope. Typically only with standing. His wife has witnessed these episodes. Improved with sitting down and having a drink. Sugars have not been low.   Cardiac catheterization in 2008 at [facility] showed 99% proximal RCA disease with endeavor DES placed 2.5 x 15 mm, also with 60% distal RCA disease, 100% OM1 disease followed by 50%, 80% proximal LAD disease, 100% followed by 90% mid LAD disease, 100% diagonal disease Normal ejection fraction at that time estimated greater than 60%  Prior cardiac catheterization in July 2005 details 60% proximal RCA followed by 50%, 90% and 70% distal RCA disease 90% mid left circumflex disease, 90% OM 3 disease, 70% proximal LAD disease extending to the mid vessel, 100% mid LAD disease, 70% distal LAD disease  Cardiac catheterization July 2000 with 70% proximal RCA disease, 90% mid RCA disease, 70% distal RCA disease, 70% proximal left circumflex disease, 70% OM1 disease, 100% OM 2 disease, 70% proximal LAD to mid LAD disease, 70% mid and distal LAD disease, 100% diagonal disease   Recent lab work in the hospital 07/26/2013 shows  creatinine 1.43, BUN 21, potassium 4.1  EKG today shows normal sinus rhythm with rate 83 beats per minute,  diffusely abnormal T waves   Outpatient Encounter Prescriptions as of 11/18/2013  Medication Sig  . aspirin 325 MG tablet Take 325 mg by mouth daily.  . clopidogrel (PLAVIX) 75 MG tablet Take 1 tablet (75 mg total) by mouth daily with breakfast.  . gabapentin (NEURONTIN) 300 MG capsule Take 3 capsules (900 mg total) by mouth 3 (three) times daily.  . insulin glargine (LANTUS) 100 UNIT/ML injection Inject 0.4 mLs (40 Units total) into the skin daily.  . insulin regular (NOVOLIN R,HUMULIN R) 100 units/mL injection 3 Units at bedtime as needed for high blood sugar.  . metoprolol tartrate (LOPRESSOR) 25 MG tablet Take 1 tablet (25 mg total) by mouth 2 (two) times daily.  . potassium chloride SA (K-DUR,KLOR-CON) 20 MEQ tablet Take 1 tablet (20 mEq total) by mouth daily.  . simvastatin (ZOCOR) 80 MG tablet Take 1 tablet (80 mg total) by mouth daily at 6 PM.  . torsemide (DEMADEX) 20 MG tablet Take 1 tablet (20 mg total) by mouth daily.  . [DISCONTINUED] amoxicillin-clavulanate (AUGMENTIN) 875-125 MG per tablet Take 1 tablet by mouth 2 (two) times daily.    Review of Systems  Constitutional: Negative.   HENT: Negative.   Eyes: Negative.   Respiratory: Negative.   Cardiovascular: Negative.   Gastrointestinal: Negative.   Endocrine: Negative.   Musculoskeletal: Negative.   Skin: Negative.   Allergic/Immunologic: Negative.   Neurological: Negative.   Hematological: Negative.   Psychiatric/Behavioral: Negative.   All other systems reviewed and are negative.  BP 120/60  Pulse 83  Ht 5\' 8"  (1.727 m)  Wt 178 lb (80.74 kg)  BMI 27.07 kg/m2  Physical Exam  Nursing note and vitals reviewed. Constitutional: He is oriented to person, place, and time. He appears well-developed and well-nourished.  HENT:  Head: Normocephalic.  Nose: Nose normal.  Mouth/Throat: Oropharynx is clear and  moist.  Eyes: Conjunctivae are normal. Pupils are equal, round, and reactive to light.  Neck: Normal range of motion. Neck supple. No JVD present.  Cardiovascular: Normal rate, regular rhythm, S1 normal, S2 normal, normal heart sounds and intact distal pulses.  Exam reveals no gallop and no friction rub.   No murmur heard. Pulses:      Carotid pulses are 2+ on the right side, and 2+ on the left side.      Radial pulses are 1+ on the right side, and 1+ on the left side.       Dorsalis pedis pulses are 0 on the right side, and 0 on the left side.       Posterior tibial pulses are 0 on the right side, and 0 on the left side.  Pulmonary/Chest: Effort normal and breath sounds normal. No respiratory distress. He has no wheezes. He has no rales. He exhibits no tenderness.  Abdominal: Soft. Bowel sounds are normal. He exhibits no distension. There is no tenderness.  Musculoskeletal: Normal range of motion. He exhibits no edema and no tenderness.  Lymphadenopathy:    He has no cervical adenopathy.  Neurological: He is alert and oriented to person, place, and time. Coordination normal.  Skin: Skin is warm and dry. No rash noted. No erythema.  Psychiatric: He has a normal mood and affect. His behavior is normal. Judgment and thought content normal.      Assessment and Plan

## 2013-11-18 NOTE — Assessment & Plan Note (Signed)
Previous osteomyelitis of the toe with long course of antibiotics

## 2013-11-18 NOTE — Progress Notes (Signed)
Subjective:    Patient ID: Todd Clay, male    DOB: 1945-12-17, 68 y.o.   MRN: [id number]  HPI  68 y/o male presents today for follow up diabetes. His wife is present for this visit. Checks his blood sugar, but does not keep records. Morning blood sugars are running between 85-94. Indicates evening values are checked "only when he feels it may be low". Will make attempt to start logging. Has had 2 episodes of hypotension/near syncopal episodes - recently seen by Dr. Rockey Situ and medications have been adjusted. Further w/u pending. Taking Lantus 40 units in the morning. Wife describes callus recently on his foot, was seen by Dr. Vickki Muff and now healing. They were both concerned with this given his recent episode of osteomyelitis and long term antibiotics.   Past Medical History  Diagnosis Date  . History of chickenpox   . Diabetes mellitus with complication   . CAD (coronary artery disease)   . Retinal detachment     OD  . Osteomyelitis     Left 2nd and 3rd metatarsal  . Diabetic neuropathy   . PVD (peripheral vascular disease)   . HTN (hypertension)   . HLD (hyperlipidemia)   . Depression   . Proliferative diabetic retinopathy     OU  . MI (myocardial infarction)     x3    Current Outpatient Prescriptions on File Prior to Visit  Medication Sig Dispense Refill  . aspirin EC 81 MG tablet Take 81 mg by mouth 2 (two) times daily.      . clopidogrel (PLAVIX) 75 MG tablet Take 1 tablet (75 mg total) by mouth daily with breakfast.  30 tablet  6  . gabapentin (NEURONTIN) 300 MG capsule Take 3 capsules (900 mg total) by mouth 3 (three) times daily.  270 capsule  6  . insulin glargine (LANTUS) 100 UNIT/ML injection Inject 0.4 mLs (40 Units total) into the skin daily.  10 mL  6  . insulin regular (NOVOLIN R,HUMULIN R) 100 units/mL injection 3 Units at bedtime as needed for high blood sugar.      . simvastatin (ZOCOR) 80 MG tablet Take 1 tablet (80 mg total) by mouth daily at 6 PM.  30  tablet  6   No current facility-administered medications on file prior to visit.     Review of Systems  Constitutional: Negative.   Respiratory: Negative.  Negative for chest tightness and shortness of breath.   Cardiovascular: Negative.  Negative for chest pain, palpitations and leg swelling.  Genitourinary: Negative.   Musculoskeletal: Negative.   Skin:       Corn on the bottom of his foot followed by Dr. Vickki Muff  Neurological: Positive for syncope (few recent episodes of near syncope). Negative for dizziness.  Psychiatric/Behavioral: Negative.   All other systems reviewed and are negative.      Objective:  BP 106/66  Pulse 77  Temp(Src) 98.4 F (36.9 C) (Oral)  Resp 14  Wt 177 lb 8 oz (80.513 kg)  SpO2 96%   Physical Exam  Constitutional: He is oriented to person, place, and time. He appears well-developed and well-nourished. No distress.  HENT:  Head: Normocephalic.  Cardiovascular: Normal rate, regular rhythm, normal heart sounds and intact distal pulses.   Pulmonary/Chest: Effort normal and breath sounds normal.  Musculoskeletal: Normal range of motion. He exhibits no edema.  Neurological: He is alert and oriented to person, place, and time.  Skin: Skin is warm and dry.  Psychiatric: He has a  normal mood and affect. His behavior is normal. Judgment and thought content normal.       Assessment & Plan:   1. Diabetes mellitus with complication NLG9Q, CBC, CMET and Urine microalbumin. Morning blood sugars are currently in acceptable range. Discussion with pt about keeping records of his blood glucose so that we can adjust his medications. He does not like to keep tract of his BG levels. Also, although he has been more compliant, he reports that he does not necessarily want to be restricted all of his life. This started a new discussion about consequences of poorly controlled diabetes. He already experienced delayed healing when he had his toes amputated. Explained that  the result of poorly controlled BG is decrease in the quality of his life. He verbalized understanding. Pt agrees to maintain log tracking sugars. Continue with Dr. Vickki Muff for foot care and continue wearing diabetic shoes. Reports compliance with insulin. Has had a few dietary indiscretions.   - Hemoglobin A1c; Future - CBC with Differential - Comprehensive metabolic panel - Urine Microalbumin w/creat. ratio - Hemoglobin A1c

## 2013-11-18 NOTE — Progress Notes (Signed)
Pre visit review using our clinic review tool, if applicable. No additional management support is needed unless otherwise documented below in the visit note. 

## 2013-11-18 NOTE — Assessment & Plan Note (Signed)
Bypass grafts as detailed above

## 2013-11-19 ENCOUNTER — Other Ambulatory Visit: Payer: Self-pay

## 2013-11-19 DIAGNOSIS — R55 Syncope and collapse: Secondary | ICD-10-CM

## 2013-11-19 DIAGNOSIS — E785 Hyperlipidemia, unspecified: Secondary | ICD-10-CM

## 2013-11-25 ENCOUNTER — Encounter (INDEPENDENT_AMBULATORY_CARE_PROVIDER_SITE_OTHER): Payer: Commercial Managed Care - HMO

## 2013-11-25 DIAGNOSIS — E785 Hyperlipidemia, unspecified: Secondary | ICD-10-CM

## 2013-11-25 DIAGNOSIS — R55 Syncope and collapse: Secondary | ICD-10-CM

## 2013-12-20 ENCOUNTER — Encounter: Payer: Self-pay | Admitting: *Deleted

## 2013-12-20 NOTE — Progress Notes (Signed)
Chart reviewed for Dm bundle.  Last OV 11/18/13. Appt sch with labs 02/17/14

## 2014-02-10 ENCOUNTER — Encounter: Payer: Self-pay | Admitting: Cardiovascular Disease

## 2014-02-10 ENCOUNTER — Ambulatory Visit (INDEPENDENT_AMBULATORY_CARE_PROVIDER_SITE_OTHER): Payer: Commercial Managed Care - HMO | Admitting: Cardiovascular Disease

## 2014-02-10 VITALS — BP 120/68 | HR 68 | Ht 68.0 in | Wt 182.2 lb

## 2014-02-10 DIAGNOSIS — IMO0002 Reserved for concepts with insufficient information to code with codable children: Secondary | ICD-10-CM

## 2014-02-10 DIAGNOSIS — I209 Angina pectoris, unspecified: Secondary | ICD-10-CM

## 2014-02-10 DIAGNOSIS — M542 Cervicalgia: Secondary | ICD-10-CM

## 2014-02-10 DIAGNOSIS — E118 Type 2 diabetes mellitus with unspecified complications: Secondary | ICD-10-CM | POA: Diagnosis not present

## 2014-02-10 DIAGNOSIS — I951 Orthostatic hypotension: Secondary | ICD-10-CM

## 2014-02-10 DIAGNOSIS — IMO0001 Reserved for inherently not codable concepts without codable children: Secondary | ICD-10-CM

## 2014-02-10 DIAGNOSIS — I2510 Atherosclerotic heart disease of native coronary artery without angina pectoris: Secondary | ICD-10-CM

## 2014-02-10 DIAGNOSIS — E785 Hyperlipidemia, unspecified: Secondary | ICD-10-CM

## 2014-02-10 DIAGNOSIS — I25119 Atherosclerotic heart disease of native coronary artery with unspecified angina pectoris: Secondary | ICD-10-CM

## 2014-02-10 DIAGNOSIS — Z951 Presence of aortocoronary bypass graft: Secondary | ICD-10-CM

## 2014-02-10 DIAGNOSIS — E1165 Type 2 diabetes mellitus with hyperglycemia: Secondary | ICD-10-CM

## 2014-02-10 NOTE — Assessment & Plan Note (Signed)
Currently with no symptoms of angina. No further workup at this time. Continue current medication regimen. We did spend some time talking about the effect of diabetes on coronary artery disease.

## 2014-02-10 NOTE — Progress Notes (Signed)
Patient ID: Todd Clay, male    DOB: [birth year], 68 y.o.   MRN: [id number]  HPI Comments:  Todd Clay is a pleasant 68 year old gentleman with history of CAD, bypass surgery December 1997 with LIMA to the LAD at [facility], several catheterizations since that time with stents placed, stent to the left circumflex and RCA in 2005, history of poorly controlled diabetes, with PAD, recent toe indications (staph) for ostiomyelitis requiring PICC line and long-term antibiotics, who presents for routine followup  In followup today, he is taking torsemide every other day, reports that blood pressure has improved. He denies any lightheadedness or dizziness. Previously had orthostatic symptoms, they have now resolved. Overall he has no complaints. We talked at length about his diabetes and need for better control. He will have followup endocrine doctor in [place].  Cardiac catheterization in 2008 at [facility] showed 99% proximal RCA disease with endeavor DES placed 2.5 x 15 mm, also with 60% distal RCA disease, 100% OM1 disease followed by 50%, 80% proximal LAD disease, 100% followed by 90% mid LAD disease, 100% diagonal disease Normal ejection fraction at that time estimated greater than 60%  Prior cardiac catheterization in July 2005 details 60% proximal RCA followed by 50%, 90% and 70% distal RCA disease 90% mid left circumflex disease, 90% OM 3 disease, 70% proximal LAD disease extending to the mid vessel, 100% mid LAD disease, 70% distal LAD disease  Cardiac catheterization July 2000 with 70% proximal RCA disease, 90% mid RCA disease, 70% distal RCA disease, 70% proximal left circumflex disease, 70% OM1 disease, 100% OM 2 disease, 70% proximal LAD to mid LAD disease, 70% mid and distal LAD disease, 100% diagonal disease   Recent lab work in the hospital 07/26/2013 shows creatinine 1.43, BUN 21, potassium 4.1 Total cholesterol 130, hemoglobin A1c of 10   Outpatient Encounter Prescriptions as of  02/10/2014  Medication Sig  . aspirin EC 81 MG tablet Take 81 mg by mouth 2 (two) times daily.  . clopidogrel (PLAVIX) 75 MG tablet Take 1 tablet (75 mg total) by mouth daily with breakfast.  . gabapentin (NEURONTIN) 300 MG capsule Take 3 capsules (900 mg total) by mouth 3 (three) times daily.  . insulin glargine (LANTUS) 100 UNIT/ML injection Inject 0.4 mLs (40 Units total) into the skin daily.  . insulin regular (NOVOLIN R,HUMULIN R) 100 units/mL injection 3 Units at bedtime as needed for high blood sugar.  . metoprolol tartrate (LOPRESSOR) 25 MG tablet Take 12.5 mg by mouth 2 (two) times daily.  . potassium chloride SA (K-DUR,KLOR-CON) 20 MEQ tablet Take 20 mEq by mouth daily. Take Monday, Wednesday and friday  . simvastatin (ZOCOR) 80 MG tablet Take 1 tablet (80 mg total) by mouth daily at 6 PM.  . torsemide (DEMADEX) 20 MG tablet Take 20 mg by mouth daily. Take Monday, Wednesday and Friday    Review of Systems  Constitutional: Negative.   HENT: Negative.   Eyes: Negative.   Respiratory: Negative.   Cardiovascular: Negative.   Gastrointestinal: Negative.   Endocrine: Negative.   Musculoskeletal: Negative.   Skin: Negative.   Allergic/Immunologic: Negative.   Neurological: Negative.   Hematological: Negative.   Psychiatric/Behavioral: Negative.   All other systems reviewed and are negative.   BP 120/68  Pulse 68  Ht 5\' 8"  (1.727 m)  Wt 182 lb 4 oz (82.668 kg)  BMI 27.72 kg/m2  Physical Exam  Nursing note and vitals reviewed. Constitutional: He is oriented to person, place, and time. He appears  well-developed and well-nourished.  HENT:  Head: Normocephalic.  Nose: Nose normal.  Mouth/Throat: Oropharynx is clear and moist.  Eyes: Conjunctivae are normal. Pupils are equal, round, and reactive to light.  Neck: Normal range of motion. Neck supple. No JVD present.  Cardiovascular: Normal rate, regular rhythm, S1 normal, S2 normal, normal heart sounds and intact distal pulses.   Exam reveals no gallop and no friction rub.   No murmur heard. Pulses:      Carotid pulses are 2+ on the right side, and 2+ on the left side.      Radial pulses are 1+ on the right side, and 1+ on the left side.       Dorsalis pedis pulses are 0 on the right side, and 0 on the left side.       Posterior tibial pulses are 0 on the right side, and 0 on the left side.  Pulmonary/Chest: Effort normal and breath sounds normal. No respiratory distress. He has no wheezes. He has no rales. He exhibits no tenderness.  Abdominal: Soft. Bowel sounds are normal. He exhibits no distension. There is no tenderness.  Musculoskeletal: Normal range of motion. He exhibits no edema and no tenderness.  Lymphadenopathy:    He has no cervical adenopathy.  Neurological: He is alert and oriented to person, place, and time. Coordination normal.  Skin: Skin is warm and dry. No rash noted. No erythema.  Psychiatric: He has a normal mood and affect. His behavior is normal. Judgment and thought content normal.      Assessment and Plan

## 2014-02-10 NOTE — Assessment & Plan Note (Signed)
He denies any anginal symptoms. 

## 2014-02-10 NOTE — Patient Instructions (Signed)
You are doing well. No medication changes were made.  Please call us if you have new issues that need to be addressed before your next appt.  Your physician wants you to follow-up in: 6 months.  You will receive a reminder letter in the mail two months in advance. If you don't receive a letter, please call our office to schedule the follow-up appointment.   

## 2014-02-10 NOTE — Assessment & Plan Note (Signed)
Cholesterol is at goal on the current lipid regimen. No changes to the medications were made.  

## 2014-02-10 NOTE — Assessment & Plan Note (Signed)
Recommended close followup with primary care and endocrine

## 2014-02-10 NOTE — Assessment & Plan Note (Signed)
Orthostatic symptoms resolved by taking torsemide every other day

## 2014-02-10 NOTE — Assessment & Plan Note (Signed)
Hemoglobin A1c greater than 10. Stressed the importance of better diabetes control

## 2014-02-11 ENCOUNTER — Ambulatory Visit: Payer: Commercial Managed Care - HMO | Admitting: Adult Health

## 2014-02-14 ENCOUNTER — Encounter: Payer: Self-pay | Admitting: Adult Health

## 2014-02-14 ENCOUNTER — Ambulatory Visit (INDEPENDENT_AMBULATORY_CARE_PROVIDER_SITE_OTHER): Payer: Commercial Managed Care - HMO | Admitting: Adult Health

## 2014-02-14 ENCOUNTER — Ambulatory Visit: Payer: Commercial Managed Care - HMO | Admitting: Adult Health

## 2014-02-14 ENCOUNTER — Other Ambulatory Visit: Payer: Self-pay | Admitting: Adult Health

## 2014-02-14 VITALS — BP 113/67 | HR 56 | Temp 97.7°F | Resp 14 | Wt 180.2 lb

## 2014-02-14 DIAGNOSIS — IMO0002 Reserved for concepts with insufficient information to code with codable children: Secondary | ICD-10-CM

## 2014-02-14 DIAGNOSIS — E118 Type 2 diabetes mellitus with unspecified complications: Secondary | ICD-10-CM

## 2014-02-14 DIAGNOSIS — Z23 Encounter for immunization: Secondary | ICD-10-CM

## 2014-02-14 DIAGNOSIS — E1165 Type 2 diabetes mellitus with hyperglycemia: Secondary | ICD-10-CM

## 2014-02-14 LAB — HEMOGLOBIN A1C: Hgb A1c MFr Bld: 10.1 % — ABNORMAL HIGH (ref 4.6–6.5)

## 2014-02-14 MED ORDER — GABAPENTIN 300 MG PO CAPS: 900.0000 mg | ORAL_CAPSULE | Freq: Three times a day (TID) | ORAL | Status: DC

## 2014-02-14 MED ORDER — CLOPIDOGREL BISULFATE 75 MG PO TABS: 75.0000 mg | ORAL_TABLET | Freq: Every day | ORAL | Status: DC

## 2014-02-14 NOTE — Patient Instructions (Signed)
  Please have your labs drawn prior to leaving the office.  Please check your blood glucose level at least once daily and record.  If A1c is still elevated, I will refer your to Endocrine.  Prescriptions for gabapentin and plavix provided with 6 refills today.  You received your flu vaccination today.

## 2014-02-14 NOTE — Progress Notes (Signed)
Patient ID: Todd Clay, male   DOB: 04-20-1946, 68 y.o.   MRN: [id number]   Subjective:    Patient ID: Todd Clay, male    DOB: [birth year], 68 y.o.   MRN: [id number]  HPI  Pt is a pleasant 68 y/o male with hx of uncontrolled diabetes who presents to clinic for follow up and also for medication refills. He has not been checking his blood glucose levels. He does report taking his medication. Reports 3 episodes of hypoglycemia mainly occuring during the early morning hours. Diabetic eye exam is up to date. Needs A1c checked today.   Past Medical History  Diagnosis Date  . History of chickenpox   . Diabetes mellitus with complication   . CAD (coronary artery disease)   . Retinal detachment     OD  . Osteomyelitis     Left 2nd and 3rd metatarsal  . Diabetic neuropathy   . PVD (peripheral vascular disease)   . HTN (hypertension)   . HLD (hyperlipidemia)   . Depression   . Proliferative diabetic retinopathy     OU  . MI (myocardial infarction)     x3    Current Outpatient Prescriptions on File Prior to Visit  Medication Sig Dispense Refill  . aspirin EC 81 MG tablet Take 81 mg by mouth 2 (two) times daily.      . clopidogrel (PLAVIX) 75 MG tablet Take 1 tablet (75 mg total) by mouth daily with breakfast.  30 tablet  6  . gabapentin (NEURONTIN) 300 MG capsule Take 3 capsules (900 mg total) by mouth 3 (three) times daily.  270 capsule  6  . insulin glargine (LANTUS) 100 UNIT/ML injection Inject 0.4 mLs (40 Units total) into the skin daily.  10 mL  6  . insulin regular (NOVOLIN R,HUMULIN R) 100 units/mL injection 3 Units at bedtime as needed for high blood sugar.      . metoprolol tartrate (LOPRESSOR) 25 MG tablet Take 12.5 mg by mouth 2 (two) times daily.      . potassium chloride SA (K-DUR,KLOR-CON) 20 MEQ tablet Take 20 mEq by mouth daily. Take Monday, Wednesday and friday      . simvastatin (ZOCOR) 80 MG tablet Take 1 tablet (80 mg total) by mouth daily at 6 PM.  30 tablet  6    . torsemide (DEMADEX) 20 MG tablet Take 20 mg by mouth daily. Take Monday, Wednesday and Friday       No current facility-administered medications on file prior to visit.     Review of Systems  Constitutional: Negative.   HENT: Negative.   Eyes: Negative.        Has seen Eye doctor within the last year.  Respiratory: Negative.   Cardiovascular: Negative.   Gastrointestinal: Negative.   Endocrine:       Reports ~ 3 episodes of hypoglycemia with levels in the 50s. These have occurred early morning.  Genitourinary: Negative.   Musculoskeletal: Negative.   Skin: Negative.   Allergic/Immunologic: Negative.   Neurological: Positive for numbness (tingling. Stable with gabapentin).  Hematological: Negative.   Psychiatric/Behavioral: Negative.        Objective:  BP 113/67  Pulse 56  Temp(Src) 97.7 F (36.5 C) (Oral)  Resp 14  Wt 180 lb 4 oz (81.761 kg)  SpO2 99%   Physical Exam  Constitutional: He is oriented to person, place, and time. He appears well-developed and well-nourished. No distress.  HENT:  Head: Normocephalic and atraumatic.  Eyes: Conjunctivae and EOM  are normal.  Cardiovascular: Normal rate, regular rhythm, normal heart sounds and intact distal pulses.  Exam reveals no gallop and no friction rub.   No murmur heard. Pulmonary/Chest: Effort normal and breath sounds normal. No respiratory distress. He has no wheezes. He has no rales.  Musculoskeletal: Normal range of motion.  Neurological: He is alert and oriented to person, place, and time.  Skin: Skin is warm and dry.  Psychiatric: He has a normal mood and affect. His behavior is normal. Judgment and thought content normal.          Assessment & Plan:   1. Diabetes mellitus with complication Check labs. If A1c not controlled will refer to Endocrine - Hemoglobin A1c  2. Need for prophylactic vaccination and inoculation against influenza Received in clinic today. - Flu Vaccine QUAD 36+ mos PF IM  (Fluarix Quad PF)

## 2014-02-17 ENCOUNTER — Ambulatory Visit: Payer: Commercial Managed Care - HMO | Admitting: Adult Health

## 2014-02-26 ENCOUNTER — Ambulatory Visit: Payer: Commercial Managed Care - HMO | Admitting: Endocrinology

## 2014-03-20 ENCOUNTER — Other Ambulatory Visit: Payer: Self-pay

## 2014-03-20 ENCOUNTER — Ambulatory Visit (INDEPENDENT_AMBULATORY_CARE_PROVIDER_SITE_OTHER): Payer: Commercial Managed Care - HMO | Admitting: Endocrinology

## 2014-03-20 ENCOUNTER — Encounter: Payer: Self-pay | Admitting: Endocrinology

## 2014-03-20 VITALS — BP 118/64 | HR 82 | Wt 181.2 lb

## 2014-03-20 DIAGNOSIS — E118 Type 2 diabetes mellitus with unspecified complications: Secondary | ICD-10-CM

## 2014-03-20 DIAGNOSIS — E785 Hyperlipidemia, unspecified: Secondary | ICD-10-CM

## 2014-03-20 DIAGNOSIS — IMO0002 Reserved for concepts with insufficient information to code with codable children: Secondary | ICD-10-CM

## 2014-03-20 DIAGNOSIS — E1042 Type 1 diabetes mellitus with diabetic polyneuropathy: Secondary | ICD-10-CM

## 2014-03-20 DIAGNOSIS — E1142 Type 2 diabetes mellitus with diabetic polyneuropathy: Secondary | ICD-10-CM | POA: Insufficient documentation

## 2014-03-20 DIAGNOSIS — Z8669 Personal history of other diseases of the nervous system and sense organs: Secondary | ICD-10-CM

## 2014-03-20 DIAGNOSIS — Z89429 Acquired absence of other toe(s), unspecified side: Secondary | ICD-10-CM

## 2014-03-20 LAB — HM DIABETES FOOT EXAM: HM Diabetic Foot Exam: ABNORMAL

## 2014-03-20 MED ORDER — ONETOUCH ULTRASOFT LANCETS MISC: Status: DC

## 2014-03-20 MED ORDER — SIMVASTATIN 80 MG PO TABS
80.0000 mg | ORAL_TABLET | Freq: Every day | ORAL | Status: DC
Start: 2014-03-20 — End: 2015-03-19

## 2014-03-20 MED ORDER — GLUCOSE BLOOD VI STRP: ORAL_STRIP | Status: DC

## 2014-03-20 NOTE — Assessment & Plan Note (Signed)
He was encouraged to perform regular self foot care. Follow back with podiatry for callus management. Wear DM shoes. Continue neurontin for neuropathy.         

## 2014-03-20 NOTE — Progress Notes (Signed)
Reason for visit-  Todd Clay is a 68 y.o.-year-old male, referred by his PCP,  [name], [name], NP for management of  likelyType 1 diabetes, uncontrolled, with complications ( ? Stage 2 CKD, neuropathy s/p toe amputations, macrovascular complications of CAD s/p CABG, hx retinal detachment right eye ).   HPI- Patient has been diagnosed with diabetes  at age 64. Recalls being tried on oral medications, but has mainly been on insulin therapy.  Tried N, R and 70/30, Novolog in the past   Pt is currently on a regimen of: - Lantus 40 units morning, if low FS in the morning then takes lower dose - Novolin R at 3 units at qhs for high sugars as needed > 300-400 ( haven't used it in a long time recently)  Patient reports that his sugars drops really fast mainly at night time, hence doesn't correct sugars in the 200 range at bedtime.     Last hemoglobin A1c was: Lab Results  Component Value Date   HGBA1C 10.1* 02/14/2014   HGBA1C 10.4* 11/18/2013     Pt checks his sugars 1-2 times a  week. Uses AccuChek glucometer. Thinks that this meter is not working right- sometimes doesn't give a reading. By recall they are:  PREMEAL Breakfast Lunch Dinner Bedtime Overall  Glucose range: 92-150   150-220   Mean/median:        POST-MEAL PC Breakfast PC Lunch PC Dinner  Glucose range:     Mean/median:       Hypoglycemia-  Had some lows- once every 6 weeks or so. Lowest sugar was 30-20s- early in the morning 4-6 am several weeks back; he has hypoglycemia awareness at 70.   Dietary habits- eats three times daily. Eats Chicken grilled, fish. Salads, beans. Tries to limit carbs, sweetened beverages, sodas, desserts.  Exercise- no formal exercise. Active around the house.  Weight -  Wt Readings from Last 3 Encounters:  03/20/14 181 lb 4 oz (82.214 kg)  02/14/14 180 lb 4 oz (81.761 kg)  02/10/14 182 lb 4 oz (82.668 kg)    Diabetes Complications-  Nephropathy- Yes, ? Stage 2  CKD, last BUN/creatinine-   Lab Results  Component Value Date   BUN 20 11/18/2013   CREATININE 1.1 11/18/2013   Lab Results  Component Value Date   GFR 73.13 11/18/2013   MICRALBCREAT 3.8 11/18/2013     Retinopathy- ?, Last DEE was in the last year. Reports left eye is fine. Right eye - Retinal detachment remote history. Neuropathy- has sensory loss over distal feet. Known neuropathy. Treated with neurontin. Prior osteomyelitis right,  with  2 toe amputations. Left toe amputations. Wears DM shoes. Sees podiatry.  Associated history - Has CAD, now s/p CABG . No prior stroke. No known hypothyroidism. his last TSH was  No results found for this basename: TSH    Hyperlipidemia-  his last set of lipids were- Currently on Zocor 80 mg. Tolerating well.  No recent lipids for review. Cardiologist monitoring lipids, Dr Tanda Rockers No results found for this basename: CHOL,  HDL,  LDLCALC,  LDLDIRECT,  TRIG,  CHOLHDL    Blood Pressure/HTN- Patient's blood pressure is well controlled today on current regimen that includes BB.  Pt has FH of DM in [place], MGF, M. aunts and uncles ( Maternal)  I have reviewed the patient's past medical history, family and social history, surgical history, medications and allergies.  Past Medical History  Diagnosis Date  . History of chickenpox   .  Diabetes mellitus with complication   . CAD (coronary artery disease)   . Retinal detachment     OD  . Osteomyelitis     Left 2nd and 3rd metatarsal  . Diabetic neuropathy   . PVD (peripheral vascular disease)   . HTN (hypertension)   . HLD (hyperlipidemia)   . Depression   . Proliferative diabetic retinopathy     OU  . MI (myocardial infarction)     x3   Past Surgical History  Procedure Laterality Date  . 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 Stents      x 5   . Retinal detachment surgery      Laser  . Vitreous retinal surgery  1985    Laser tx OU  . Coronary artery bypass graft  1998    x1 vessel @  [facility]  . Cardiac catheterization    . Coronary angioplasty      Patient has a total of 5 stents placed.   History   Social History  . Marital Status: Married    Spouse Name: N/A    Number of Children: N/A  . Years of Education: N/A   Occupational History  . Not on file.   Social History Main Topics  . Smoking status: Never Smoker   . Smokeless tobacco: Not on file  . Alcohol Use: No  . Drug Use: No  . Sexual Activity: Not on file   Other Topics Concern  . Not on file   Social History Narrative  . No narrative on file   Current Outpatient Prescriptions on File Prior to Visit  Medication Sig Dispense Refill  . aspirin EC 81 MG tablet Take 81 mg by mouth 2 (two) times daily.      . clopidogrel (PLAVIX) 75 MG tablet Take 1 tablet (75 mg total) by mouth daily with breakfast.  30 tablet  6  . gabapentin (NEURONTIN) 300 MG capsule Take 3 capsules (900 mg total) by mouth 3 (three) times daily.  270 capsule  6  . insulin glargine (LANTUS) 100 UNIT/ML injection Inject 0.4 mLs (40 Units total) into the skin daily.  10 mL  6  . insulin regular (NOVOLIN R,HUMULIN R) 100 units/mL injection 3 Units at bedtime as needed for high blood sugar.      . metoprolol tartrate (LOPRESSOR) 25 MG tablet Take 12.5 mg by mouth 2 (two) times daily.      . potassium chloride SA (K-DUR,KLOR-CON) 20 MEQ tablet Take 20 mEq by mouth daily. Take Monday, Wednesday and friday      . torsemide (DEMADEX) 20 MG tablet Take 20 mg by mouth daily. Take Monday, Wednesday and Friday       No current facility-administered medications on file prior to visit.   No Known Allergies Family History  Problem Relation Age of Onset  . Kidney disease Mother     Kidney failure  . Heart disease Father     CAD  . Cancer Brother     Head & Neck Tumor  . Diabetes Maternal Aunt   . Diabetes Maternal Uncle   . Diabetes Maternal Grandmother   . Diabetes Maternal Grandfather     Review of Systems: [x]  complains of  [  ]  denies General:   [  ] Recent weight change [  ] Fatigue  [  ] Loss of appetite Eyes: [  ]  Vision Difficulty [  ]  Eye  pain ENT: [  ]  Hearing difficulty [  ]  Difficulty Swallowing CVS: [  ] Chest pain [  ]  Palpitations/Irregular Heart beat [  ]  Shortness of breath lying flat [  ] Swelling of legs Resp: [  ] Frequent Cough [  ] Shortness of Breath  [  ]  Wheezing GI: [  ] Heartburn  [  ] Nausea or Vomiting  [  ] Diarrhea [  ] Constipation  [  ] Abdominal Pain GU: [  ]  Polyuria  [  ]  nocturia Bones/joints:  [  ]  Muscle aches  [  ] Joint Pain  [  ] Bone pain Skin/Hair/Nails: [  ]  Rash  [  ] New stretch marks [  ]  Itching [  ] Hair loss [  ]  Excessive hair growth Reproduction: [  ] Low sexual desire , [  ]  Women: Menstrual cycle problems [  ]  Women: Breast Discharge [ x ] Men: Difficulty with erections [  ]  Men: Enlarged Breasts CNS: [  ] Frequent Headaches [  ] Blurry vision [  ] Tremors [  ] Seizures [  ] Loss of consciousness [  ] Localized weakness Endocrine: [  ]  Excess thirst [  ]  Feeling excessively hot [  ]  Feeling excessively cold Heme: [  ]  Easy bruising [  ]  Enlarged glands or lumps in neck Allergy: [  ]  Food allergies [  ] Environmental allergies  PE: BP 118/64  Pulse 82  Wt 181 lb 4 oz (82.214 kg)  SpO2 99% Wt Readings from Last 3 Encounters:  03/20/14 181 lb 4 oz (82.214 kg)  02/14/14 180 lb 4 oz (81.761 kg)  02/10/14 182 lb 4 oz (82.668 kg)   GENERAL: No acute distress, well developed HEENT:  Eye exam shows normal external appearance. Oral exam shows normal mucosa .  NECK:   Neck exam shows no lymphadenopathy. No Carotids bruits. Thyroid is not enlarged and no nodules felt.  no acanthosis nigricans LUNGS:         Chest is symmetrical. Lungs are clear to auscultation.Marland Kitchen   HEART:         Heart sounds:  S1 and S2 are normal. Probable murmur or split S2, No murmurs or clicks heard. ABDOMEN:  No Distention present. Liver and spleen are not palpable. No other  mass or tenderness present.  EXTREMITIES:     There is no edema. 2+ DP pulses  NEUROLOGICAL:     Grossly intact.            Diabetic foot exam done with shoes and socks removed: Abnormal - loss of Normal Monofilament testing bilaterally.right 3rd and 4th toe amputation, left 2nd and 3rd toe amputation, Skin normal color. No open wounds. Dry skin. Calluses+ MUSCULOSKELETAL:       There is no enlargement or gross deformity of the joints.  SKIN:       No rash  ASSESSMENT AND PLAN: Problem List Items Addressed This Visit     Endocrine   Diabetes mellitus with complication - Primary     Recent A1c well above target. His reported blood sugar levels do not correspond with his elevated A1c. I explained to him importance of checking sugars 4x daily to find out unrecognized hyperglycemia.  He is agreeable. New One touch mini meter given to the patient.   Discussed diet and exercise. He will  try to work on improving these.  Check labs today to assess etiology of DM. If he indeed has type 1 DM, then would favor correcting basal/bolus mismatch between lantus and regular. Will consider switching to meal time insulin rather than regular if he is agreeable. For now, he will continue current regimen with Lantus and use regular insulin if needed for high sugars, since his reported sugars are closer to target.    Hypoglycemia recognition and treatment protocol discussed.   RTC 2 weeks.     Relevant Orders      C-peptide      Anti-islet cell antibody      Glutamic acid decarboxylase auto abs     Nervous and Auditory   Diabetic polyneuropathy     He was encouraged to perform regular self foot care. Follow back with podiatry for callus management. Wear DM shoes. Continue neurontin for neuropathy.      Other   Hyperlipidemia     Managed by his cardiologist. Continue current statin.     Toe amputation status     As above. Importance of foot care and glucose management stressed.     History of retinal  detachment     Encouraged regular follow up with opthalmology.          - Return to clinic in 2 weeks with sugar log/meter.  Zamia Tyminski [facility] [place] 03/20/2014 12:42 PM

## 2014-03-20 NOTE — Assessment & Plan Note (Signed)
Recent A1c well above target. His reported blood sugar levels do not correspond with his elevated A1c. I explained to him importance of checking sugars 4x daily to find out unrecognized hyperglycemia.  He is agreeable. New One touch mini meter given to the patient.   Discussed diet and exercise. He will try to work on improving these.  Check labs today to assess etiology of DM. If he indeed has type 1 DM, then would favor correcting basal/bolus mismatch between lantus and regular. Will consider switching to meal time insulin rather than regular if he is agreeable. For now, he will continue current regimen with Lantus and use regular insulin if needed for high sugars, since his reported sugars are closer to target.    Hypoglycemia recognition and treatment protocol discussed.   RTC 2 weeks.

## 2014-03-20 NOTE — Assessment & Plan Note (Signed)
Managed by his cardiologist. Continue current statin.

## 2014-03-20 NOTE — Assessment & Plan Note (Signed)
As above. Importance of foot care and glucose management stressed.

## 2014-03-20 NOTE — Patient Instructions (Signed)
Check sugars 4 x daily ( before each meal and at bedtime).  Record them in a log book and bring that/meter to next appointment.   Record if having lows. Treat lows according to protocol.   For now, continue current Lantus and Regular insulin like you are using.  Regular self foot care - follow back with podiatry.   Labs today to assess etiology of Diabetes.  Please come back for a follow-up appointment in 2 weeks

## 2014-03-20 NOTE — Assessment & Plan Note (Signed)
Encouraged regular follow up with opthalmology.

## 2014-03-20 NOTE — Progress Notes (Signed)
Pre visit review using our clinic review tool, if applicable. No additional management support is needed unless otherwise documented below in the visit note. 

## 2014-03-21 LAB — C-PEPTIDE: C PEPTIDE: 0 ng/mL — AB (ref 0.80–3.90)

## 2014-03-24 LAB — GLUTAMIC ACID DECARBOXYLASE AUTO ABS

## 2014-03-25 LAB — ANTI-ISLET CELL ANTIBODY: Pancreatic Islet Cell Antibody: <5 JDF Units (ref <5)

## 2014-03-26 ENCOUNTER — Encounter: Payer: Self-pay | Admitting: *Deleted

## 2014-04-10 ENCOUNTER — Ambulatory Visit (INDEPENDENT_AMBULATORY_CARE_PROVIDER_SITE_OTHER): Payer: Commercial Managed Care - HMO | Admitting: Endocrinology

## 2014-04-10 ENCOUNTER — Encounter: Payer: Self-pay | Admitting: Endocrinology

## 2014-04-10 VITALS — BP 120/64 | HR 85 | Wt 185.0 lb

## 2014-04-10 DIAGNOSIS — E1065 Type 1 diabetes mellitus with hyperglycemia: Secondary | ICD-10-CM

## 2014-04-10 DIAGNOSIS — IMO0002 Reserved for concepts with insufficient information to code with codable children: Secondary | ICD-10-CM

## 2014-04-10 DIAGNOSIS — E1042 Type 1 diabetes mellitus with diabetic polyneuropathy: Secondary | ICD-10-CM

## 2014-04-10 MED ORDER — GLUCAGON (RDNA) 1 MG IJ KIT: 1.0000 mg | PACK | Freq: Once | INTRAMUSCULAR | Status: DC | PRN

## 2014-04-10 NOTE — Progress Notes (Signed)
Pre visit review using our clinic review tool, if applicable. No additional management support is needed unless otherwise documented below in the visit note. 

## 2014-04-10 NOTE — Patient Instructions (Addendum)
Check sugars 4 x daily ( before each meal and at bedtime).  Record them in a log book and bring that/meter to next appointment.   Try to go out for a walk every day.   Decrease lantus to 38 units Simpson daily Keep taking regular insulin like you are doing right now.  At next visit, we may change the insulin from regular to Novolog ( faster acting insulin) to cut back on lows.  Discussed low sugars and correction. Glucagon script as emergency.   Please come back for a follow-up appointment in 3 weeks

## 2014-04-10 NOTE — Progress Notes (Signed)
Reason for visit-  Todd Clay is a 68 y.o.-year-old male,  for management of  likelyType 1 diabetes, uncontrolled, with complications ( ? Stage 2 CKD, neuropathy s/p toe amputations, macrovascular complications of CAD s/p CABG, hx retinal detachment right eye ). GAD negative , but low Cpep October 2015   HPI- Patient has been diagnosed with diabetes  at age 79. Recalls being tried on oral medications, but has mainly been on insulin therapy. Tried N, R and 70/30, Novolog in the past   Pt is currently on a regimen of: - Lantus 40 units morning, if low FS in the morning then takes lower dose ( recently hasn't been titrating dose) - Novolin R at 3 units at qhs for high sugars as needed > 300-400 ( used once in past 2 weeks)  Patient reports that his sugars drops really fast mainly at night time, hence doesn't correct sugars in the 200 range at bedtime.     Last hemoglobin A1c was: Lab Results  Component Value Date   HGBA1C 10.1* 02/14/2014   HGBA1C 10.4* 11/18/2013     Pt checks his sugars 1-2 times a  day. Uses AccuChek glucometer.  One Touch mini given at last visit. By sugar log they are:  PREMEAL Breakfast Lunch Dinner Bedtime Overall  Glucose range: 138-335   58-150   Mean/median:        POST-MEAL PC Breakfast PC Lunch PC Dinner  Glucose range:     Mean/median:       Hypoglycemia-  Had some lows- once every 6 weeks or so. Lowest sugar was this morning, hasn't checked because was quite weak and treated with soda and sandwich and didn't check and went back to sleep; he has hypoglycemia awareness at 70.   Dietary habits- eats three times daily. Eats Chicken grilled, fish. Salads, beans. Tries to limit carbs, sweetened beverages, sodas, desserts.  Exercise- no formal exercise. Active around the house.  Weight -  Wt Readings from Last 3 Encounters:  04/10/14 185 lb (83.915 kg)  03/20/14 181 lb 4 oz (82.214 kg)  02/14/14 180 lb 4 oz (81.761 kg)    Diabetes Complications-   Nephropathy- Yes, ? Stage 2  CKD, last BUN/creatinine-  Lab Results  Component Value Date   BUN 20 11/18/2013   CREATININE 1.1 11/18/2013   Lab Results  Component Value Date   GFR 73.13 11/18/2013   MICRALBCREAT 3.8 11/18/2013     Retinopathy- ?, Last DEE was in the last year. Reports left eye is fine. Right eye - Retinal detachment remote history. Neuropathy- has sensory loss over distal feet. Known neuropathy. Treated with neurontin. Prior osteomyelitis right,  with  2 toe amputations. Left toe amputations. Wears DM shoes. Sees podiatry.  Associated history - Has CAD, now s/p CABG . No prior stroke. No known hypothyroidism. his last TSH was  No results found for: TSH  Hyperlipidemia-  his last set of lipids were- Currently on Zocor 80 mg. Tolerating well.  No recent lipids for review. Cardiologist monitoring lipids, Dr Tanda Rockers No results found for: CHOL  Blood Pressure/HTN- Patient's blood pressure is well controlled today on current regimen that includes BB.   I have reviewed the patient's past medical history, medications and allergies.    Current Outpatient Prescriptions on File Prior to Visit  Medication Sig Dispense Refill  . aspirin EC 81 MG tablet Take 81 mg by mouth 2 (two) times daily.    . clopidogrel (PLAVIX) 75 MG tablet Take 1  tablet (75 mg total) by mouth daily with breakfast. 30 tablet 6  . gabapentin (NEURONTIN) 300 MG capsule Take 3 capsules (900 mg total) by mouth 3 (three) times daily. 270 capsule 6  . glucose blood (ONE TOUCH ULTRA TEST) test strip Use as instructed 4 times daily, Dx code: 250.02 120 each 12  . insulin glargine (LANTUS) 100 UNIT/ML injection Inject 0.4 mLs (40 Units total) into the skin daily. 10 mL 6  . insulin regular (NOVOLIN R,HUMULIN R) 100 units/mL injection 3 Units at bedtime as needed for high blood sugar.    . Lancets (ONETOUCH ULTRASOFT) lancets Use as instructed four times daily, dx code 250.02 120 each 12  . metoprolol  tartrate (LOPRESSOR) 25 MG tablet Take 12.5 mg by mouth 2 (two) times daily.    . potassium chloride SA (K-DUR,KLOR-CON) 20 MEQ tablet Take 20 mEq by mouth daily. Take Monday, Wednesday and friday    . simvastatin (ZOCOR) 80 MG tablet Take 1 tablet (80 mg total) by mouth daily at 6 PM. 90 tablet 1  . torsemide (DEMADEX) 20 MG tablet Take 20 mg by mouth daily. Take Monday, Wednesday and Friday     No current facility-administered medications on file prior to visit.   No Known Allergies  Review of Systems- [ x ]  Complains of    [  ]  denies [  ] Recent weight change [  ]  Fatigue [  ] polydipsia [  ] polyuria [  ]  nocturia [  ]  vision difficulty [  ] chest pain [  ] shortness of breath [  ] leg swelling [  ] cough [  ] nausea/vomiting [  ] diarrhea [  ] constipation [  ] abdominal pain [  ]  tingling/numbness in extremities [  ]  concern with feet ( wounds/sores)   PE: BP 120/64 mmHg  Pulse 85  Wt 185 lb (83.915 kg)  SpO2 97% Wt Readings from Last 3 Encounters:  04/10/14 185 lb (83.915 kg)  03/20/14 181 lb 4 oz (82.214 kg)  02/14/14 180 lb 4 oz (81.761 kg)   Exam: deferred  ASSESSMENT AND PLAN: Problem List Items Addressed This Visit      Endocrine   Diabetes type 1, uncontrolled    Recent A1c well above target. His reported blood sugar levels do not correspond with his elevated A1c. I explained to him importance of checking sugars 4x daily to find out unrecognized hyperglycemia, which is happening probably at lunch time or at bedtime.  He is agreeable.   Discussed exercise and walking. He will try to work on improving this.  He reports having hypoglycemia in the night time occasionally, but don't see those readings here on his log.  Decrease lantus to 38 units daily.  Continue using regular as needed. Will consider switching to meal time insulin rather than regular if he is agreeable at next visit. Marland Kitchen   Hypoglycemia recognition and treatment protocol discussed in detail  again. Explained that he has been over-correcting his low sugars.   RTC 3 weeks.       Relevant Medications      glucagon (GLUCAGON EMERGENCY) 1 MG injection     Nervous and Auditory   Diabetic polyneuropathy - Primary    He was encouraged to perform regular self foot care. Follow back with podiatry for callus management. Wear DM shoes. Continue neurontin for neuropathy.      Relevant Medications      glucagon (  GLUCAGON EMERGENCY) 1 MG injection        - Return to clinic in 3 weeks with sugar log/meter. 25 minutes spent with the patient, > 50 % time spent discussing topics mentioned above and counseling the patient and wife.   Finnean Cerami [facility] 04/10/2014 10:41 AM

## 2014-04-10 NOTE — Assessment & Plan Note (Signed)
He was encouraged to perform regular self foot care. Follow back with podiatry for callus management. Wear DM shoes. Continue neurontin for neuropathy.         

## 2014-04-10 NOTE — Assessment & Plan Note (Signed)
Recent A1c well above target. His reported blood sugar levels do not correspond with his elevated A1c. I explained to him importance of checking sugars 4x daily to find out unrecognized hyperglycemia, which is happening probably at lunch time or at bedtime.  He is agreeable.   Discussed exercise and walking. He will try to work on improving this.  He reports having hypoglycemia in the night time occasionally, but don't see those readings here on his log.  Decrease lantus to 38 units daily.  Continue using regular as needed. Will consider switching to meal time insulin rather than regular if he is agreeable at next visit. Marland Kitchen   Hypoglycemia recognition and treatment protocol discussed in detail again. Explained that he has been over-correcting his low sugars.   RTC 3 weeks.

## 2014-05-01 ENCOUNTER — Encounter: Payer: Self-pay | Admitting: Endocrinology

## 2014-05-01 ENCOUNTER — Ambulatory Visit (INDEPENDENT_AMBULATORY_CARE_PROVIDER_SITE_OTHER): Payer: Commercial Managed Care - HMO | Admitting: Endocrinology

## 2014-05-01 VITALS — BP 120/72 | HR 89 | Wt 181.5 lb

## 2014-05-01 DIAGNOSIS — E1065 Type 1 diabetes mellitus with hyperglycemia: Secondary | ICD-10-CM

## 2014-05-01 DIAGNOSIS — E1042 Type 1 diabetes mellitus with diabetic polyneuropathy: Secondary | ICD-10-CM

## 2014-05-01 DIAGNOSIS — IMO0002 Reserved for concepts with insufficient information to code with codable children: Secondary | ICD-10-CM

## 2014-05-01 MED ORDER — INSULIN PEN NEEDLE 32G X 4 MM MISC: Status: DC

## 2014-05-01 MED ORDER — INSULIN GLARGINE 100 UNIT/ML ~~LOC~~ SOLN: 19.0000 [IU] | Freq: Every day | SUBCUTANEOUS | Status: DC

## 2014-05-01 MED ORDER — INSULIN LISPRO 100 UNIT/ML (KWIKPEN): 6.0000 [IU] | PEN_INJECTOR | Freq: Three times a day (TID) | SUBCUTANEOUS | Status: DC

## 2014-05-01 NOTE — Progress Notes (Signed)
Pre visit review using our clinic review tool, if applicable. No additional management support is needed unless otherwise documented below in the visit note. 

## 2014-05-01 NOTE — Progress Notes (Signed)
Reason for visit-  Todd Clay is a 68 y.o.-year-old male,  Here for management of  likelyType 1 diabetes, uncontrolled, with complications ( ? Stage 2 CKD, neuropathy s/p toe amputations, macrovascular complications of CAD s/p CABG, hx retinal detachment right eye ). GAD negative , but low Cpep October 2015   HPI- Patient has been diagnosed with diabetes  at age 74. Recalls being tried on oral medications, but has mainly been on insulin therapy. Tried N, R and 70/30, Novolog in the past   Pt is currently on a regimen of: - Lantus 38 units morning,-hasn't been titrating dose - Novolin R at 3 units at qhs for high sugars as needed > 300-400 ( not used recently)  Patient reports that his sugars drops really fast mainly at night time, hence doesn't correct sugars in the 200 range at bedtime.     Last hemoglobin A1c was: Lab Results  Component Value Date   HGBA1C 10.1* 02/14/2014   HGBA1C 10.4* 11/18/2013     Pt checks his sugars 3 times a  day. Uses One Touch mini glucometer.  By sugar log they are:  PREMEAL Breakfast Lunch Dinner Bedtime Overall  Glucose range: 99-160s mainly with some 200s  95-215 180-288   Mean/median:        POST-MEAL PC Breakfast PC Lunch PC Dinner  Glucose range:     Mean/median:      Last 2 days they have been running higher without known cause in the 200-300 range.  Hypoglycemia-  Had some lows- once every 6 weeks or so. reduced frequency of lows since last visit since lantus down titrated; he has hypoglycemia awareness at 70.   Dietary habits- eats three times daily. Eats Chicken grilled, fish. Salads, beans. Tries to limit carbs, sweetened beverages, sodas, desserts. Done better with this since last time.  Exercise- no formal exercise. Active around the house.  Weight - lost few pounds since last time Wt Readings from Last 3 Encounters:  05/01/14 181 lb 8 oz (82.328 kg)  04/10/14 185 lb (83.915 kg)  03/20/14 181 lb 4 oz (82.214 kg)    Diabetes  Complications-  Nephropathy- Yes, ? Stage 2  CKD, last BUN/creatinine-  Lab Results  Component Value Date   BUN 20 11/18/2013   CREATININE 1.1 11/18/2013   Lab Results  Component Value Date   GFR 73.13 11/18/2013   MICRALBCREAT 3.8 11/18/2013     Retinopathy- ?, Last DEE was in the last year. Reports left eye is fine. Right eye - Retinal detachment remote history. Neuropathy- has sensory loss over distal feet. Known neuropathy. Treated with neurontin. Prior osteomyelitis right,  with  2 toe amputations. Left toe amputations. Wears DM shoes. Sees podiatry.  Associated history - Has CAD, now s/p CABG . No prior stroke. No known hypothyroidism. his last TSH was  No results found for: TSH  Hyperlipidemia-  his last set of lipids were- Currently on Zocor 80 mg. Tolerating well.  No recent lipids for review. Cardiologist monitoring lipids, Dr Tanda Rockers No results found for: CHOL  Blood Pressure/HTN- Patient's blood pressure is well controlled today on current regimen that includes BB.   I have reviewed the patient's past medical history, medications and allergies.    Current Outpatient Prescriptions on File Prior to Visit  Medication Sig Dispense Refill  . aspirin EC 81 MG tablet Take 81 mg by mouth 2 (two) times daily.    . clopidogrel (PLAVIX) 75 MG tablet Take 1 tablet (75  mg total) by mouth daily with breakfast. 30 tablet 6  . gabapentin (NEURONTIN) 300 MG capsule Take 3 capsules (900 mg total) by mouth 3 (three) times daily. 270 capsule 6  . glucose blood (ONE TOUCH ULTRA TEST) test strip Use as instructed 4 times daily, Dx code: 250.02 120 each 12  . Lancets (ONETOUCH ULTRASOFT) lancets Use as instructed four times daily, dx code 250.02 120 each 12  . metoprolol tartrate (LOPRESSOR) 25 MG tablet Take 12.5 mg by mouth 2 (two) times daily.    . potassium chloride SA (K-DUR,KLOR-CON) 20 MEQ tablet Take 20 mEq by mouth daily. Take Monday, Wednesday and friday    . simvastatin  (ZOCOR) 80 MG tablet Take 1 tablet (80 mg total) by mouth daily at 6 PM. 90 tablet 1  . torsemide (DEMADEX) 20 MG tablet Take 20 mg by mouth daily. Take Monday, Wednesday and Friday    . glucagon (GLUCAGON EMERGENCY) 1 MG injection Inject 1 mg into the muscle once as needed (in case of severe hypoglycemia). (Patient not taking: Reported on 05/01/2014) 1 each 12   No current facility-administered medications on file prior to visit.   No Known Allergies  Review of Systems- [ x ]  Complains of    [  ]  denies [  ] Recent weight change [  ]  Fatigue [  ] polydipsia [  ] polyuria [  ]  nocturia [  ]  vision difficulty [  ] chest pain [  ] shortness of breath [  ] leg swelling [  ] cough [  ] nausea/vomiting [  ] diarrhea [  ] constipation [  ] abdominal pain [  ]  tingling/numbness in extremities [  ]  concern with feet ( wounds/sores)   PE: BP 120/72 mmHg  Pulse 89  Wt 181 lb 8 oz (82.328 kg)  SpO2 98% Wt Readings from Last 3 Encounters:  05/01/14 181 lb 8 oz (82.328 kg)  04/10/14 185 lb (83.915 kg)  03/20/14 181 lb 4 oz (82.214 kg)   Exam: deferred  ASSESSMENT AND PLAN: Problem List Items Addressed This Visit      Endocrine   Diabetes type 1, uncontrolled - Primary    Recent A1c well above target.Sugars are rising throughout the day, with highest readings at qhs. No recent hypoglycemia.  Discussed need to balance out the basal/bolus insulin amounts to lower risk of hypoglycemia and provide flexibility to his schedule. He is agreeable to try rapid acting insulin instead of regular insulin as well.    He will try to work on improving his lifestyle factors.  Patient Instructions  Check sugars 4 x daily ( before each meal and at bedtime).  Record them in a log book and bring that/meter to next appointment.   Stop regular insulin.   Change Lantus to 19 units in the morning.   Start Humalog at 6 units [place] with each meal. Take no more than 15 minutes prior to meal. Take this insulin  only if you are going to eat a meal. If you remember after the meal, then don't the insulin. Remember that this insulin starts working quickly and it usually out of the system by about 4 hours.   Please come back for a follow-up appointment in 7 weeks.      Hypoglycemia recognition and treatment protocol discussed at last visit in detail.  RTC 7 weeks.         Relevant Medications  insulin glargine (LANTUS) 100 UNIT/ML injection      insulin lispro (HUMALOG KWIKPEN) 100 UNIT/ML KiwkPen      Insulin Pen Needle (BD PEN NEEDLE NANO U/F) 32G X 4 MM MISC     Nervous and Auditory   Diabetic polyneuropathy    He was encouraged to perform regular self foot care. Follow back with podiatry for callus management. Wear DM shoes. Continue neurontin for neuropathy.        Relevant Medications      insulin glargine (LANTUS) 100 UNIT/ML injection      insulin lispro (HUMALOG KWIKPEN) 100 UNIT/ML KiwkPen      Insulin Pen Needle (BD PEN NEEDLE NANO U/F) 32G X 4 MM MISC        - Return to clinic in 7 weeks with sugar log/meter. 25 minutes spent with the patient, > 50 % time spent discussing topics mentioned above and counseling the patient and wife.   Natara Monfort [facility] 05/02/2014 5:30 PM

## 2014-05-01 NOTE — Patient Instructions (Addendum)
Check sugars 4 x daily ( before each meal and at bedtime).  Record them in a log book and bring that/meter to next appointment.   Stop regular insulin.   Change Lantus to 19 units in the morning.   Start Humalog at 6 units [place] with each meal. Take no more than 15 minutes prior to meal. Take this insulin only if you are going to eat a meal. If you remember after the meal, then don't the insulin. Remember that this insulin starts working quickly and it usually out of the system by about 4 hours.   Please come back for a follow-up appointment in 7 weeks.

## 2014-05-02 NOTE — Assessment & Plan Note (Signed)
Recent A1c well above target.Sugars are rising throughout the day, with highest readings at qhs. No recent hypoglycemia.  Discussed need to balance out the basal/bolus insulin amounts to lower risk of hypoglycemia and provide flexibility to his schedule. He is agreeable to try rapid acting insulin instead of regular insulin as well.    He will try to work on improving his lifestyle factors.  Patient Instructions  Check sugars 4 x daily ( before each meal and at bedtime).  Record them in a log book and bring that/meter to next appointment.   Stop regular insulin.   Change Lantus to 19 units in the morning.   Start Humalog at 6 units [place] with each meal. Take no more than 15 minutes prior to meal. Take this insulin only if you are going to eat a meal. If you remember after the meal, then don't the insulin. Remember that this insulin starts working quickly and it usually out of the system by about 4 hours.   Please come back for a follow-up appointment in 7 weeks.      Hypoglycemia recognition and treatment protocol discussed at last visit in detail.  RTC 7 weeks.

## 2014-05-02 NOTE — Assessment & Plan Note (Signed)
He was encouraged to perform regular self foot care. Follow back with podiatry for callus management. Wear DM shoes. Continue neurontin for neuropathy.         

## 2014-06-19 ENCOUNTER — Ambulatory Visit (INDEPENDENT_AMBULATORY_CARE_PROVIDER_SITE_OTHER): Payer: PPO | Admitting: Endocrinology

## 2014-06-19 ENCOUNTER — Ambulatory Visit: Payer: Commercial Managed Care - HMO | Admitting: Endocrinology

## 2014-06-19 ENCOUNTER — Encounter: Payer: Self-pay | Admitting: Endocrinology

## 2014-06-19 VITALS — BP 120/62 | HR 74 | Resp 14 | Ht 68.0 in | Wt 185.5 lb

## 2014-06-19 DIAGNOSIS — E1065 Type 1 diabetes mellitus with hyperglycemia: Secondary | ICD-10-CM

## 2014-06-19 DIAGNOSIS — E785 Hyperlipidemia, unspecified: Secondary | ICD-10-CM

## 2014-06-19 DIAGNOSIS — IMO0002 Reserved for concepts with insufficient information to code with codable children: Secondary | ICD-10-CM

## 2014-06-19 DIAGNOSIS — E1042 Type 1 diabetes mellitus with diabetic polyneuropathy: Secondary | ICD-10-CM

## 2014-06-19 LAB — COMPREHENSIVE METABOLIC PANEL
ALBUMIN: 3.7 g/dL (ref 3.5–5.2)
ALT: 14 U/L (ref 0–53)
AST: 19 U/L (ref 0–37)
Alkaline Phosphatase: 89 U/L (ref 39–117)
BILIRUBIN TOTAL: 0.5 mg/dL (ref 0.2–1.2)
BUN: 23 mg/dL (ref 6–23)
CO2: 28 meq/L (ref 19–32)
Calcium: 9 mg/dL (ref 8.4–10.5)
Chloride: 98 mEq/L (ref 96–112)
Creatinine, Ser: 1.13 mg/dL (ref 0.40–1.50)
GFR: 68.55 mL/min (ref >60.00)
Glucose, Bld: 385 mg/dL — ABNORMAL HIGH (ref 70–99)
POTASSIUM: 4.4 meq/L (ref 3.5–5.1)
SODIUM: 134 meq/L — AB (ref 135–145)
TOTAL PROTEIN: 6.2 g/dL (ref 6.0–8.3)

## 2014-06-19 LAB — HEMOGLOBIN A1C: Hgb A1c MFr Bld: 10.2 % — ABNORMAL HIGH (ref 4.6–6.5)

## 2014-06-19 MED ORDER — GLUCOSE BLOOD VI STRP: ORAL_STRIP | Status: DC

## 2014-06-19 MED ORDER — POTASSIUM CHLORIDE CRYS ER 20 MEQ PO TBCR: 20.0000 meq | EXTENDED_RELEASE_TABLET | Freq: Every day | ORAL | Status: DC

## 2014-06-19 MED ORDER — SIMVASTATIN 80 MG PO TABS: 80.0000 mg | ORAL_TABLET | Freq: Every day | ORAL | Status: DC

## 2014-06-19 MED ORDER — INSULIN REGULAR HUMAN 100 UNIT/ML IJ SOLN: 6.0000 [IU] | Freq: Three times a day (TID) | INTRAMUSCULAR | Status: AC

## 2014-06-19 NOTE — Assessment & Plan Note (Signed)
He was encouraged to perform regular self foot care. Follow back with podiatry for callus management. Wear DM shoes. Continue neurontin for neuropathy.

## 2014-06-19 NOTE — Assessment & Plan Note (Signed)
Recent A1c well above target. Update A1c today. Reminded him why I would like to adjust his basal/bolus ratio. Discussed option of trying Novolog versus regular insulin. He would like to try Regular insulin at meal time.  Discussed hypoglycemia and appropriate correction.  Discussed ways that he could check his sugars by himself and he will give it a try.    He will try to work on improving his lifestyle factors.  Patient Instructions   Change Lantus to 18 units daily in the morning.   Start regular insulin at 6 units with each meal. Give about 15 minutes prior to the meal. If you skip the meal, then avoid taking that shot of regular insulin.   Check sugars 4 x daily ( before each meal and at bedtime).  Record them in a log book and bring that/meter to next appointment.   Labs today.   Please come back for a follow-up appointment in 6 weeks    Hypoglycemia recognition and treatment protocol discussed again at this visit in detail.  RTC 6 weeks.

## 2014-06-19 NOTE — Assessment & Plan Note (Signed)
Managed by his cardiologist. Continue current statin.   Asked him to fwd his report to me for review.

## 2014-06-19 NOTE — Patient Instructions (Signed)
  Change Lantus to 18 units daily in the morning.   Start regular insulin at 6 units with each meal. Give about 15 minutes prior to the meal. If you skip the meal, then avoid taking that shot of regular insulin.   Check sugars 4 x daily ( before each meal and at bedtime).  Record them in a log book and bring that/meter to next appointment.   Labs today.   Please come back for a follow-up appointment in 6 weeks

## 2014-06-19 NOTE — Progress Notes (Signed)
Reason for visit-  Todd Clay is a 69 y.o.-year-old male,  Here for management of  likelyType 1 diabetes, uncontrolled, with complications ( ? Stage 2 CKD, neuropathy s/p toe amputations, macrovascular complications of CAD s/p CABG, hx retinal detachment right eye ). GAD negative , but low Cpep October 2015   HPI- Patient has been diagnosed with diabetes  at age 72. Recalls being tried on oral medications, but has mainly been on insulin therapy. Tried N, R and 70/30, Novolog in the past  * at last visit in Dec 2015, I had adjusted his lantus down and asked him to start Humalog. He reports that Humalog was not covered by his plan and never started it. Taking lantus at slightly higher dose since last time, as was eating more around [place] *also wife not checking sugars for him anymore and wants him to take care of his DM himself. He gets frustrated checking his sugars as often needs 3 attempts to check his sugars once "cant get the blood placed right in the test strip"  Pt is currently on a regimen of: - Lantus 40 units morning, ( back on it again since last visit) - Novolin R at 3 units at qhs for high sugars as needed > 300-400 ( not used recently)  Patient reports that his sugars drops really fast mainly at night time, hence doesn't correct sugars in the 200 range at bedtime.     Last hemoglobin A1c was: Lab Results  Component Value Date   HGBA1C 10.1* 02/14/2014   HGBA1C 10.4* 11/18/2013     Pt checks his sugars 3 times a  Day usually. Hasn't checked recently. Requesting strip refill.  Uses One Touch mini glucometer.  By sugar log they are:  PREMEAL Breakfast Lunch Dinner Bedtime Overall  Glucose range: na   na   Mean/median:        POST-MEAL PC Breakfast PC Lunch PC Dinner  Glucose range:     Mean/median:       Hypoglycemia-  Had some lows- early morning around 3-4 am, didn't check , but started correcting sugars with all kinds of food. Lately has been trying to adjust  his corrective carbs down.   he has hypoglycemia awareness at 70.   Dietary habits- eats three times daily. Eats Chicken grilled, fish. Salads, beans. Tries to limit carbs, sweetened beverages, sodas, desserts. Done worse with this since last time.  Exercise- no formal exercise. Active around the house.  Weight - up few pounds since last time Wt Readings from Last 3 Encounters:  06/19/14 185 lb 8 oz (84.142 kg)  05/01/14 181 lb 8 oz (82.328 kg)  04/10/14 185 lb (83.915 kg)    Diabetes Complications-  Nephropathy- Yes, ? Stage 2  CKD, last BUN/creatinine-  Lab Results  Component Value Date   BUN 20 11/18/2013   CREATININE 1.1 11/18/2013   Lab Results  Component Value Date   GFR 73.13 11/18/2013   MICRALBCREAT 3.8 11/18/2013     Retinopathy- ?, Last DEE was in the last year. Reports left eye is fine. Right eye - Retinal detachment remote history. Neuropathy- has sensory loss over distal feet. Known neuropathy. Treated with neurontin. Prior osteomyelitis right,  with  2 toe amputations. Left toe amputations. Wears DM shoes. Sees podiatry.  Associated history - Has CAD, now s/p CABG . No prior stroke. No known hypothyroidism. his last TSH was  No results found for: TSH  Hyperlipidemia-  his last set of lipids were-  Currently on Zocor 80 mg. Tolerating well.  No recent lipids for review. Cardiologist monitoring lipids, Dr Jerilee Field follow up appt March 2015 No results found for: CHOL  Blood Pressure/HTN- Patient's blood pressure is well controlled today on current regimen that includes BB.   I have reviewed the patient's past medical history, medications and allergies.    Current Outpatient Prescriptions on File Prior to Visit  Medication Sig Dispense Refill  . aspirin EC 81 MG tablet Take 81 mg by mouth 2 (two) times daily.    . clopidogrel (PLAVIX) 75 MG tablet Take 1 tablet (75 mg total) by mouth daily with breakfast. 30 tablet 6  . gabapentin (NEURONTIN) 300 MG  capsule Take 3 capsules (900 mg total) by mouth 3 (three) times daily. 270 capsule 6  . glucagon (GLUCAGON EMERGENCY) 1 MG injection Inject 1 mg into the muscle once as needed (in case of severe hypoglycemia). 1 each 12  . insulin glargine (LANTUS) 100 UNIT/ML injection Inject 0.19 mLs (19 Units total) into the skin daily. (Patient taking differently: Inject 40 Units into the skin daily. ) 10 mL 6  . Insulin Pen Needle (BD PEN NEEDLE NANO U/F) 32G X 4 MM MISC Use for insulin administration three times daily 100 each 11  . Lancets (ONETOUCH ULTRASOFT) lancets Use as instructed four times daily, dx code 250.02 120 each 12  . metoprolol tartrate (LOPRESSOR) 25 MG tablet Take 12.5 mg by mouth 2 (two) times daily.    [place] Kitchen torsemide (DEMADEX) 20 MG tablet Take 20 mg by mouth daily. Take Monday, Wednesday and Friday     No current facility-administered medications on file prior to visit.   No Known Allergies  Review of Systems- [ x ]  Complains of    [  ]  denies [  ] Recent weight change [  ]  Fatigue [  ] polydipsia [  ] polyuria [  ]  nocturia [  ]  vision difficulty [  ] chest pain [  ] shortness of breath [  ] leg swelling [  ] cough [  ] nausea/vomiting [  ] diarrhea [  ] constipation [  ] abdominal pain [  ]  tingling/numbness in extremities [  ]  concern with feet ( wounds/sores)   PE: BP 120/62 mmHg  Pulse 74  Resp 14  Ht 5\' 8"  (1.727 m)  Wt 185 lb 8 oz (84.142 kg)  BMI 28.21 kg/m2  SpO2 97% Wt Readings from Last 3 Encounters:  06/19/14 185 lb 8 oz (84.142 kg)  05/01/14 181 lb 8 oz (82.328 kg)  04/10/14 185 lb (83.915 kg)   Exam: deferred  ASSESSMENT AND PLAN: Problem List Items Addressed This Visit      Endocrine   Diabetes type 1, uncontrolled - Primary    Recent A1c well above target. Update A1c today. Reminded him why I would like to adjust his basal/bolus ratio. Discussed option of trying Novolog versus regular insulin. He would like to try Regular insulin at meal  time.  Discussed hypoglycemia and appropriate correction.  Discussed ways that he could check his sugars by himself and he will give it a try.    He will try to work on improving his lifestyle factors.  Patient Instructions   Change Lantus to 18 units daily in the morning.   Start regular insulin at 6 units with each meal. Give about 15 minutes prior to the meal. If you skip the meal, then avoid taking  that shot of regular insulin.   Check sugars 4 x daily ( before each meal and at bedtime).  Record them in a log book and bring that/meter to next appointment.   Labs today.   Please come back for a follow-up appointment in 6 weeks    Hypoglycemia recognition and treatment protocol discussed again at this visit in detail.  RTC 6 weeks.             Relevant Medications   simvastatin (ZOCOR) tablet   glucose blood (ONE TOUCH ULTRA TEST) test strip   insulin regular (NOVOLIN R RELION) 100 units/mL injection   Other Relevant Orders   Comprehensive metabolic panel   Hemoglobin A1c     Nervous and Auditory   Diabetic polyneuropathy    He was encouraged to perform regular self foot care. Follow back with podiatry for callus management. Wear DM shoes. Continue neurontin for neuropathy.            Relevant Medications   simvastatin (ZOCOR) tablet   insulin regular (NOVOLIN R RELION) 100 units/mL injection   Other Relevant Orders   Comprehensive metabolic panel   Hemoglobin A1c     Other   Hyperlipidemia    Managed by his cardiologist. Continue current statin.   Asked him to fwd his report to me for review.      Relevant Medications   simvastatin (ZOCOR) tablet   Other Relevant Orders   Comprehensive metabolic panel   Hemoglobin A1c        - Return to clinic in 6 weeks with sugar log/meter.  Shubh Chiara [facility] 06/19/2014 2:17 PM

## 2014-06-19 NOTE — Progress Notes (Signed)
Pre visit review using our clinic review tool, if applicable. No additional management support is needed unless otherwise documented below in the visit note. 

## 2014-07-31 ENCOUNTER — Encounter: Payer: Self-pay | Admitting: Endocrinology

## 2014-07-31 ENCOUNTER — Ambulatory Visit (INDEPENDENT_AMBULATORY_CARE_PROVIDER_SITE_OTHER): Payer: PPO | Admitting: Endocrinology

## 2014-07-31 VITALS — BP 86/60 | HR 57 | Resp 14 | Ht 68.0 in | Wt 184.2 lb

## 2014-07-31 DIAGNOSIS — E1065 Type 1 diabetes mellitus with hyperglycemia: Secondary | ICD-10-CM

## 2014-07-31 DIAGNOSIS — IMO0002 Reserved for concepts with insufficient information to code with codable children: Secondary | ICD-10-CM

## 2014-07-31 DIAGNOSIS — E1042 Type 1 diabetes mellitus with diabetic polyneuropathy: Secondary | ICD-10-CM

## 2014-07-31 NOTE — Progress Notes (Signed)
Reason for visit-  Todd Clay is a 69 y.o.-year-old male,  Here for management of  likelyType 1 diabetes, uncontrolled, with complications ( ? Stage 2 CKD, neuropathy s/p toe amputations, macrovascular complications of CAD s/p CABG, hx retinal detachment right eye ). GAD negative , but low Cpep October 2015   HPI- Patient has been diagnosed with diabetes  at age 61. Recalls being tried on oral medications, but has mainly been on insulin therapy. Tried N, R and 70/30, Novolog in the past  * at last visit in Dec 2015, I had adjusted his lantus down and asked him to start Humalog. He reports that Humalog was not covered by his plan and never started it.  *since last time done better with testing  Pt is currently on a regimen of: - Lantus 18 units morning - Novolin R at 6 units qac, but also using it at qhs- 4-6 units *Patient reports that his sugars drops really fast mainly at night time, hence doesn't correct sugars unless above the 200 range at bedtime.   *sporadic testing recently as family has been sick, not taking insulin when not checking * taking regular insulin also at qhs, misunderstood instructions  Last hemoglobin A1c was: Lab Results  Component Value Date   HGBA1C 10.2* 06/19/2014   HGBA1C 10.1* 02/14/2014   HGBA1C 10.4* 11/18/2013     Pt checks his sugars 3-4 times a  Day usually. Uses One Touch mini glucometer.  By sugar log they are:  PREMEAL Breakfast Lunch Dinner Bedtime Overall  Glucose range: 47-183 144-252 120-445 89-325   Mean/median:        POST-MEAL PC Breakfast PC Lunch PC Dinner  Glucose range:     Mean/median:       Hypoglycemia-  Had some lows- in the morning recently- probably secondary to taking regular insulin at qhs, recently taking it only if sugars are elevated >200-250.  Once recently at beach, am FS was 34 and EMS had to be called.  Today am was low at 47, didn't recheck after having Bf and drove himself for the appt  he has hypoglycemia  awareness at 70.   Dietary habits- eats three times daily. Eats Chicken grilled, fish. Salads, beans. Tries to limit carbs, sweetened beverages, sodas, desserts. Done good with this since last time.  Exercise- no formal exercise. Active around the house.  Weight -  Wt Readings from Last 3 Encounters:  07/31/14 184 lb 4 oz (83.575 kg)  06/19/14 185 lb 8 oz (84.142 kg)  05/01/14 181 lb 8 oz (82.328 kg)    Diabetes Complications-  Nephropathy- Yes, ? Stage 2  CKD, last BUN/creatinine-  Lab Results  Component Value Date   BUN 23 06/19/2014   CREATININE 1.13 06/19/2014   Lab Results  Component Value Date   GFR 68.55 06/19/2014   MICRALBCREAT 3.8 11/18/2013     Retinopathy- ?, Last DEE was in the last year. Reports left eye is fine. Right eye - Retinal detachment remote history. Neuropathy- has sensory loss over distal feet. Known neuropathy. Treated with neurontin. Prior osteomyelitis right,  with  2 toe amputations. Left toe amputations. Wears DM shoes. Sees podiatry.  Associated history - Has CAD, now s/p CABG . No prior stroke. No known hypothyroidism. his last TSH was  No results found for: TSH  Hyperlipidemia-  his last set of lipids were- Currently on Zocor 80 mg. Tolerating well.  No recent lipids for review. Cardiologist monitoring lipids, Dr Jerilee Field follow  up appt March 2015 No results found for: CHOL  Blood Pressure/HTN- Patient's blood pressure is low on current regimen that includes BB. Reports feeling fine without LH, dizziness. Repeat BP was better. Had a low sugar this morning, and attributes low BP to this.    I have reviewed the patient's past medical history, medications and allergies.    Current Outpatient Prescriptions on File Prior to Visit  Medication Sig Dispense Refill  . aspirin EC 81 MG tablet Take 81 mg by mouth 2 (two) times daily.    . clopidogrel (PLAVIX) 75 MG tablet Take 1 tablet (75 mg total) by mouth daily with breakfast. 30 tablet 6   . gabapentin (NEURONTIN) 300 MG capsule Take 3 capsules (900 mg total) by mouth 3 (three) times daily. 270 capsule 6  . glucagon (GLUCAGON EMERGENCY) 1 MG injection Inject 1 mg into the muscle once as needed (in case of severe hypoglycemia). 1 each 12  . glucose blood (ONE TOUCH ULTRA TEST) test strip Use as instructed 4 times daily, Dx code: 250.02 120 each 12  . insulin glargine (LANTUS) 100 UNIT/ML injection Inject 0.19 mLs (19 Units total) into the skin daily. (Patient taking differently: Inject 18 Units into the skin daily. ) 10 mL 6  . Insulin Pen Needle (BD PEN NEEDLE NANO U/F) 32G X 4 MM MISC Use for insulin administration three times daily 100 each 11  . insulin regular (NOVOLIN R RELION) 100 units/mL injection Inject 0.06 mLs (6 Units total) into the skin 3 (three) times daily before meals. 10 mL 2  . Lancets (ONETOUCH ULTRASOFT) lancets Use as instructed four times daily, dx code 250.02 120 each 12  . metoprolol tartrate (LOPRESSOR) 25 MG tablet Take 12.5 mg by mouth 2 (two) times daily.    . potassium chloride SA (K-DUR,KLOR-CON) 20 MEQ tablet Take 1 tablet (20 mEq total) by mouth daily. Take Monday, Wednesday and friday 90 tablet 0  . simvastatin (ZOCOR) 80 MG tablet Take 1 tablet (80 mg total) by mouth daily at 6 PM. 90 tablet 0  . torsemide (DEMADEX) 20 MG tablet Take 20 mg by mouth daily. Take Monday, Wednesday and Friday     No current facility-administered medications on file prior to visit.   No Known Allergies  Review of Systems- [ x ]  Complains of    [  ]  denies [  ] Recent weight change [  ]  Fatigue [  ] polydipsia [  ] polyuria [  ]  nocturia [  ]  vision difficulty [  ] chest pain [  ] shortness of breath [  ] leg swelling [ x ] cough-improving- whole family is sick with URI/sore throat [  ] nausea/vomiting [  ] diarrhea [  ] constipation [  ] abdominal pain [  ]  tingling/numbness in extremities [  ]  concern with feet ( wounds/sores)   PE: BP 86/60 mmHg  Pulse  57  Resp 14  Ht 5\' 8"  (1.727 m)  Wt 184 lb 4 oz (83.575 kg)  BMI 28.02 kg/m2  SpO2 97% Wt Readings from Last 3 Encounters:  07/31/14 184 lb 4 oz (83.575 kg)  06/19/14 185 lb 8 oz (84.142 kg)  05/01/14 181 lb 8 oz (82.328 kg)   Repeat BP 110/46 HEENT: [place]/AT, EOMI, no icterus, no proptosis, no chemosis, no mild lid lag, no retraction, eyes close completely Lungs: good air entry, clear bilaterally Heart: S1&S2 normal, regular rate & rhythm; no murmurs,  rubs or gallops Neuro: normal gait,  no proximal myopathy    ASSESSMENT AND PLAN: Problem List Items Addressed This Visit      Endocrine   Diabetes type 1, uncontrolled - Primary    Recent A1c well above target. Reminded him why I would like to adjust his basal/bolus ratio. Discussed that we are trying to use Regular insulin as meal time insulin. He should not be using this at mealtimes.   Discussed hypoglycemia and appropriate correction.    He will try to work on improving his lifestyle factors.  Patient Instructions  Continue lantus at 18 units in the morning.  Change regular insulin at 6 units at BF, 8 units at lunch time, 6 units with SUPPER. Dont take any additional regular insulin at bedtime.   Send me sugars in 2 weeks  Please come back for a follow-up appointment in 6 weeks    Hypoglycemia recognition and treatment protocol discussed again at this visit in detail. Asked him not to drive unless sugars are >100- check always after a low.                  Nervous and Auditory   Diabetic polyneuropathy    He was encouraged to perform regular self foot care. Follow back with podiatry for callus management. Wear DM shoes. Continue neurontin for neuropathy.                   - Return to clinic in 6 weeks with sugar log/meter.  Santi Troung [employer] [facility] 07/31/2014 12:58 PM

## 2014-07-31 NOTE — Assessment & Plan Note (Signed)
He was encouraged to perform regular self foot care. Follow back with podiatry for callus management. Wear DM shoes. Continue neurontin for neuropathy.

## 2014-07-31 NOTE — Progress Notes (Signed)
Pre visit review using our clinic review tool, if applicable. No additional management support is needed unless otherwise documented below in the visit note. 

## 2014-07-31 NOTE — Assessment & Plan Note (Signed)
Recent A1c well above target. Reminded him why I would like to adjust his basal/bolus ratio. Discussed that we are trying to use Regular insulin as meal time insulin. He should not be using this at mealtimes.   Discussed hypoglycemia and appropriate correction.    He will try to work on improving his lifestyle factors.  Patient Instructions  Continue lantus at 18 units in the morning.  Change regular insulin at 6 units at BF, 8 units at lunch time, 6 units with SUPPER. Dont take any additional regular insulin at bedtime.   Send me sugars in 2 weeks  Please come back for a follow-up appointment in 6 weeks    Hypoglycemia recognition and treatment protocol discussed again at this visit in detail. Asked him not to drive unless sugars are >100- check always after a low.

## 2014-07-31 NOTE — Patient Instructions (Signed)
Continue lantus at 18 units in the morning.  Change regular insulin at 6 units at BF, 8 units at lunch time, 6 units with SUPPER. Dont take any additional regular insulin at bedtime.   Send me sugars in 2 weeks  Please come back for a follow-up appointment in 6 weeks

## 2014-09-16 ENCOUNTER — Other Ambulatory Visit: Payer: Self-pay | Admitting: *Deleted

## 2014-09-16 DIAGNOSIS — E1042 Type 1 diabetes mellitus with diabetic polyneuropathy: Secondary | ICD-10-CM

## 2014-09-16 DIAGNOSIS — IMO0002 Reserved for concepts with insufficient information to code with codable children: Secondary | ICD-10-CM

## 2014-09-16 DIAGNOSIS — E1065 Type 1 diabetes mellitus with hyperglycemia: Secondary | ICD-10-CM

## 2014-09-16 MED ORDER — INSULIN GLARGINE 100 UNIT/ML ~~LOC~~ SOLN: 18.0000 [IU] | Freq: Every day | SUBCUTANEOUS | Status: DC

## 2014-09-17 ENCOUNTER — Other Ambulatory Visit: Payer: Self-pay | Admitting: *Deleted

## 2014-09-17 MED ORDER — TORSEMIDE 20 MG PO TABS
20.0000 mg | ORAL_TABLET | Freq: Every day | ORAL | Status: DC
Start: 2014-09-17 — End: 2014-11-03

## 2014-09-17 MED ORDER — METOPROLOL TARTRATE 25 MG PO TABS: 12.5000 mg | ORAL_TABLET | Freq: Two times a day (BID) | ORAL | Status: DC

## 2014-09-19 ENCOUNTER — Ambulatory Visit: Payer: PPO | Admitting: Nurse Practitioner

## 2014-09-19 NOTE — Consult Note (Signed)
Details:   - Full note dictated. a/p Osteomyelitis left 2nd toe. Recommend toe amputation. d/w pt and consent given. NPO now. Plan for surgery tonight.   Electronic Signatures: Samara Deist (MD)  (Signed 21-Dec-14 12:24)  Authored: Details   Last Updated: 21-Dec-14 12:24 by Samara Deist (MD)

## 2014-09-19 NOTE — H&P (Signed)
PATIENT NAME:  Todd Clay, Todd Clay MR#:  [id number] DATE OF BIRTH:  03-Jan-1946  DATE OF ADMISSION:  05/18/2013  REFERRING PHYSICIAN:  Dr. Delman Kitten.  PRIMARY CARE PHYSICIAN:  Dr. Lavera Guise.   CHIEF COMPLAINT:  Left toe swelling.   HISTORY OF PRESENT ILLNESS:  A 69 year old Caucasian gentleman with past medical history of insulin requiring diabetes complicated by neuropathy, coronary artery disease, status post PCI and stenting, presenting with left toe swelling and redness described as a 2 to 3 day duration of gradual onset, second left toe erythema and edema with scant purulent drainage, which is foul smelling.  He denies any pain, fever, chills, or systemic symptoms.  He has a previous had similar symptoms which required amputations on the right toes.  In the Emergency Department, he was given Zosyn thus far, however he has no further complaints.   REVIEW OF SYSTEMS:  CONSTITUTIONAL:  Denies fever, fatigue, weakness, pain.  EYES:  Denied blurred vision, double vision, eye pain.  EARS, NOSE, THROAT:  Denies tinnitus, ear pain, hearing loss.  RESPIRATORY:  Denies cough, wheeze, shortness of breath.  CARDIOVASCULAR:  Denies chest pain, palpitations, edema.  GASTROINTESTINAL:  Denies nausea, vomiting, diarrhea, abdominal pain.  GENITOURINARY:  Denies dysuria, hematuria.  ENDOCRINE:  Denies nocturia or thyroid problems.   HEMATOLOGY AND LYMPHATIC:  Denies easy bruising or bleeding.  SKIN:  Denies any rashes, though positive for left toe lesion as described above.  MUSCULOSKELETAL:  Denies any pain in neck, back, shoulder, knees, hips, any arthritic symptoms.  NEUROLOGIC:  Denies any paralysis, paresthesias, however does mention decreased sensation bilaterally of the feet.  PSYCHIATRIC:  Denies anxiety or depressive symptoms.  Otherwise, full review of systems performed by me is negative.   PAST MEDICAL HISTORY:  Insulin requiring diabetes complicated by neuropathy, coronary artery disease status  post PCI and stenting as well as anxiety, not otherwise specified.   SOCIAL HISTORY:  Denies alcohol, tobacco or drug usage.   FAMILY HISTORY:  Positive for diabetes.   ALLERGIES:  No known drug allergies.   HOME MEDICATIONS:  Include aspirin 325 mg by mouth daily, Plavix 75 mg by mouth daily, gabapentin 300 mg 3 capsules 3 times daily, Klor-Con 20 mEq by mouth daily, Lantus 40 units subcutaneous daily, metoprolol 50 mg by mouth twice daily, simvastatin 80 mg by mouth daily, torsemide 20 mg by mouth daily.   PHYSICAL EXAMINATION: VITAL SIGNS:  Temperature 98.3, heart rate 67, respirations 18, blood pressure 124/62, saturating 94% on room air.  Weight 81.6 kg, BMI 27.4.  GENERAL:  Well-nourished, well-developed, Caucasian gentleman, currently in no acute distress.  HEAD:  Normocephalic, atraumatic. EYES:  Pupils equal, round, reactive to light.  Extraocular muscles intact.  No scleral icterus.  MOUTH:  Dry mucosal membranes.  Dentition intact.  No abscess noted.   EAR, NOSE, THROAT:  Throat clear without exudates.  No external lesions.  NECK:  Supple.  No thyromegaly.  No nodules.  No JVD.  PULMONARY:  Clear to auscultation bilaterally.  No wheezes, rales, rhonchi.  No use of accessory muscles.  Good respiratory effort.  CHEST:  Nontender to palpation.  CARDIOVASCULAR:  S1, S2, regular rate and rhythm.  No murmurs, rubs or gallops.  No edema.  Pedal pulses 1+ bilaterally.  GASTROINTESTINAL:  Soft, nontender, nondistended.  No masses.  Positive bowel sounds.  No hepatosplenomegaly.  MUSCULOSKELETAL:  No swelling, clubbing or edema.  Range of motion full in all extremities.  NEUROLOGIC:  Cranial nerves II through XII intact.  Sensation greatly diminished in the feet.  Reflexes intact.  SKIN:  Second left toe gross erythema and edema with a small ulcer on the medial aspect of this portion of the toe, likely secondary to overlying toenails.  Otherwise, no further, lesions, rashes, cyanosis.  Skin  warm, dry.  Turgor is intact.  PSYCHIATRIC:  Mood and affect within normal limits.  Awake, alert, oriented x 3.  Insight and judgment are intact.   LABORATORY DATA:  X-ray of the foot revealing soft tissue swelling.  No evidence of osteomyelitis.  Sodium 129, potassium 4.1, chloride 95, bicarb 28, BUN 23, creatinine 1.31, glucose 451.  WBC 8.4, hemoglobin 12, platelets of 249, ESR of 65.   ASSESSMENT AND PLAN:  A 69 year old Caucasian gentleman with a history of diabetes insulin requiring, coronary artery disease, presenting with left toe swelling and redness.  1.  Left second toe cellulitis.  Given history of methicillin resistant staphylococcus aureus per documentation, we will treat with vancomycin.  He has already been given a dose of Zosyn thus far in the Emergency Department.  He has no evidence of systemic illness.  As noted, we will consult podiatry.  2.  Type 2 diabetes, poorly-controlled, complicated by neuropathy.  Continue with Lantus and adjust as required.  Add insulin sliding scale q. 6 hours with Accu-Cheks.  3.  Neuropathy.  Continue gabapentin.   4.  Coronary artery disease, status post PCI with stenting.  Continue aspirin, Plavix, Lopressor, Zocor.  5.  Hypertension.  Continue Lopressor and torsemide.  6.  VTE prophylaxis with heparin subQ.  7.  CODE STATUS:  THE PATIENT IS A FULL CODE.   TIME SPENT:  45 minutes.    ____________________________ Aaron Mose. Alexys Lobello, MD dkh:ea D: 05/18/2013 20:43:01 ET T: 05/18/2013 23:36:13 ET JOB#: [id number]  cc: Aaron Mose. Lekendrick Alpern, MD, <Dictator> Tayt Moyers Woodfin Ganja MD ELECTRONICALLY SIGNED 05/19/2013 1:54

## 2014-09-20 NOTE — Consult Note (Signed)
PATIENT NAME:  Todd Clay, Todd Clay MR#:  [id number] DATE OF BIRTH:  [birth year]  DATE OF CONSULTATION:  05/19/2013  REFERRING PHYSICIAN:   CONSULTING PHYSICIAN:  Iceis Knab A. Vickki Muff, DPM  REASON FOR CONSULTATION: Left second toe infection.   HISTORY OF PRESENT ILLNESS: This is a 69 year old gentleman who was admitted from the ER yesterday with an infection in his left second toe. He was started on IV antibiotics. He states this has been progressively getting worse for 3 to 4 weeks now. He finally allowed his wife to see this, who recommended he come into the hospital. He has undergone multiple toe amputations on his right foot in the past. He denies any fever or chills, states he has been diabetic basically all his life, for 60 years now. He has noticed a fair amount drainage from the area with some foul odor.   PAST MEDICAL HISTORY: Insulin-dependent diabetes, neuropathy, coronary artery disease, status post PCI stenting.   HOME MEDICATIONS: Aspirin, Plavix, gabapentin, potassium, Lantus, metoprolol, simvastatin, torsemide.   ALLERGIES: No known drug allergies.   SOCIAL HISTORY: No smoking or alcohol currently.   REVIEW OF SYSTEMS: Per history of present illness no recent fever or chills. No nausea, vomiting. No pain into his lower extremities though he does have neuropathy.   PHYSICAL EXAMINATION: GENERAL: He is alert and oriented.  VASCULAR: He has fully palpable DP and PT pulses to both right and left lower extremities.  NEUROLOGIC: He is completely neuropathic.  DERMATOLOGIC: He has a well-healed scar on his right foot. He is status post multiple toe amputations on this side. He has a small hyperkeratotic area. There is no ulcerations on the right foot. On the left foot, he has undergone a distal tuft amputation of his left third toe. He has a noted open draining area at the middle phalanx region on his second toe medially. There is marked purulent drainage. There is erythema to the base of the  second toe. This probes directly down to the bone and joint. It does not extend proximal to the MTPJ. No other areas of ulceration noted.  MUSCULOSKELETAL: His great toe and second toe have a pressure-induced area between the 2 from mild hallux valgus deformity. Overall, he has mild edema to the left lower extremity.   ASSESSMENT: Osteomyelitis by definition with purulent drainage and probing directly down to bone.   PLAN: I recommended surgical amputation of the second toe for complete removal of the infection. I discussed this versus medical treatment with antibiotics. He has elected to undergo amputation as he has been through this in the past and this has been progressively getting worse for some time now. All risks, benefits, alternatives and complications associated with surgery were discussed with the patient and full written consent has been given. No obvious erosive changes were noted. There is marked diffuse edema to the second toe. I believe there is at minimum diffuse osteomyelitis to this area as this does probe to bone and it is directly down to it  with an extensive amount of purulent drainage and there is at minimum early-stage osteomyelitis going on. I did discuss we could perform an MRI but he wishes to go ahead and proceed with surgical intervention, which I agree this is going to give him his best outcome long-term.   ____________________________ [name], DPM jaf:cs D: 05/19/2013 12:31:28 ET T: 05/19/2013 15:42:07 ET JOB#: [id number]  cc: Larkin Ina A. Vickki Muff, DPM, <Dictator> Ameerah Huffstetler DPM ELECTRONICALLY SIGNED 06/20/2013 10:12

## 2014-09-20 NOTE — Consult Note (Signed)
Admit Diagnosis:   LT FOOT OSTEOMYELITIS: Onset Date: 24-Jul-2013, Status: Active, Description: LT FOOT OSTEOMYELITIS    MRSA: > 1year ago, Jan 2006   MI: 2001   chf:    Anxiety:    htn:    Diabetes Mellitus,Type I (IDD):    [id number]:    toe amputation-3rd 12/05:    Stents-1997:   Home Medications: Medication Instructions Status  Bactrim DS 800 mg-160 mg oral tablet 1 tab(s) orally 2 times a day Active  Norco 325 mg-5 mg oral tablet 1 tab(s) orally every 6 hours, As Needed - for Pain Active  aspirin 325 mg oral tablet 1 tab(s) orally once a day Active  gabapentin 300 mg oral capsule 3 cap(s) orally 3 times a day Active  Lantus 100 units/mL subcutaneous solution 40 unit(s) subcutaneous once a day Active  clopidogrel 75 mg oral tablet 1 tab(s) orally once a day Active  simvastatin 80 mg oral tablet 1 tab(s) orally once a day (in the morning) Active  Klor-Con 20 mEq oral tablet, extended release 1 tab(s) orally once a day Active  torsemide 20 mg oral tablet 1 tab(s) orally once a day Active  metoprolol tartrate 50 mg oral tablet 1 tab(s) orally 2 times a day Active    No Known Allergies:    General Aspect Pt with history of osteomyelitis.  Underwent 2nd toe amputation complicated with wound dehiscence and further I & D.  Noted to have worsening drainage with pathologic fracture to 2nd and 3rd metatarsal heads last week.  Initially was resistent to admission and surgery but has decided to undergo surgical debridment with IV antibiotics.   Case History and Physical Exam:  Cardiovascular Palpable pulses to b/l feet   Musculoskeletal Pt s/p left 2nd toe amputation   Neurological Neuropahtic   Skin Pt with draining abscess and surrounding erythema to left forefoot.    Impression Osteomyelitis left foot 2nd and 3rd mtpj's with dm and neuropathy.   Plan Plan for surgical debridement and placment of antibiotics with deadspace. NPO for now.   Will consult ID  and have PICC placed. Appreciate IM assistance with this patient.   Electronic Signatures: Samara Deist (MD)  (Signed [phone] 12:53)  Authored: Health Issues, Significant Events - History, Home Medications, Allergies, General Aspect/Present Illness, History and Physical Exam, Impression/Plan   Last Updated: 25-Feb-15 12:53 by Samara Deist (MD)

## 2014-09-20 NOTE — Op Note (Signed)
PATIENT NAME:  Todd Clay, Todd Clay MR#:  [id number] DATE OF BIRTH:  19-Dec-1945  DATE OF PROCEDURE:  07/24/2013  PREOPERATIVE DIAGNOSIS:  Osteomyelitis, left second and third metatarsals.  POSTOPERATIVE DIAGNOSIS:  Osteomyelitis, left second and third metatarsals.   PROCEDURE:  1.  Excision second metatarsal, left foot.  2.  Partial excision third ray, left foot.   SURGEON:  Daniela Siebers A. Vickki Muff, DPM.   ANESTHESIA:  IV sedation with local.   HEMOSTASIS:  Ankle tourniquet inflated to 250 mmHg for 23 minutes.   COMPLICATIONS:  None.   SPECIMEN:  Metatarsal head and third toe for culture as well as pathology.   OPERATIVE INDICATIONS:  This is a 69 year old diabetic gentleman who has developed osteomyelitis to his second and third metatarsals.  He has undergone conservative treatment and presents today for surgical intervention.  He was admitted with continued drainage from a dehisced wound from previous surgeries.  Surgery was discussed with the patient and informed consent has been given.   DESCRIPTION OF PROCEDURE:  The patient was brought into the OR and placed on the operating table in the supine position.  IV sedation was administered by the anesthesia team.  Local sedation was administered around the forefoot region.  The left foot and ankle were then prepped and draped in the usual sterile fashion.  Attention was directed to the second and third MTPJ where an incision was made basically midline between the second and third metatarsal heads.  Sharp and blunt dissection was carried down to the periosteum.  The third toe was amputated with a racket-type incision around the toe itself.  Next, the metatarsal of the second and third metatarsals were removed just proximal to the surgical neck.  The distal one-third of the metatarsal was removed.  The wound was then flushed with 2 liters of saline with a pulse lavage.  Further removal of any infectious obvious grossly infected material was then performed.   The tourniquet was dropped.  All bleeders were Bovie-cauterized.  Finally vancomycin and tobramycin mixture of calcium sulfate beads were placed into the wound.  This was stimulin beads.  The wound was then flushed.  A bulky sterile dressing was then placed on the foot after closure with 3-0 nylon.  He will be readmitted to the floor, continued with IV antibiotics.  Infectious disease has been consulted.  A PICC line has been placed.  I will change the dressing in two days.     ____________________________ [name], DPM jaf:ea D: 07/24/2013 22:14:07 ET T: 07/24/2013 23:35:42 ET JOB#: [id number]  cc: Larkin Ina A. Vickki Muff, DPM, <Dictator> Teale Goodgame DPM ELECTRONICALLY SIGNED 08/09/2013 10:07

## 2014-09-20 NOTE — Discharge Summary (Signed)
Dates of Admission and Diagnosis:  Date of Admission 25-Jun-2013   Date of Discharge 27-Jun-2013   Admitting Diagnosis Left foot diabetic infection   Final Diagnosis Left foot diabetic infection    Chief Complaint/History of Present Illness Pt admitted with left foot infection at 2nd toe amputation site. Started on IV abx and pt scheduled for surgery. Has had recent increased redness to left foot over last 1-2 weeks.  Noted purulence from previous incision site.   Routine Micro:  27-Jan-15 17:47   Micro Text Report BLOOD CULTURE   COMMENT                   NO GROWTH IN 18-24 HOURS   ANTIBIOTIC                         18:00   Micro Text Report BLOOD CULTURE   COMMENT                   NO GROWTH IN 18-24 HOURS   ANTIBIOTIC                       28-Jan-15 11:42   Micro Text Report WOUND AER/ANAEROBIC CULT   ORGANISM 1                MODERATE GROWTH STAPHYLOCOCCUS AUREUS   COMMENT                   SENSITIVITIES TO FOLLOW   ANTIBIOTIC                       Routine Chem:  27-Jan-15 21:11   Hemoglobin A1c ([facility])  10.7 (The American Diabetes Association recommends that a primary goal of therapy should be <7% and that physicians should reevaluate the treatment regimen in patients with HbA1c values consistently >8%.)  BUN  27  Creatinine (comp)  1.66  Routine Hem:  27-Jan-15 21:11   WBC (CBC) 8.0   Hospital Course:  Hospital Course Pt admitted and started on IV ancef for MSSA. Underwent I & D in OR on day 2.  Mild infection noted and wound packed open. Dressing changed today and wound is stable.  Mild erythema along incision as expected. No purulence today. Pt stable for d/c on po abx.   Condition on Discharge Stable   DISCHARGE INSTRUCTIONS HOME MEDS:  Medication Reconciliation: Patient's Home Medications at Discharge:     Medication Instructions  aspirin 325 mg oral tablet  1 tab(s) orally once a day   gabapentin 300 mg oral capsule  3 cap(s) orally 3 times a  day   lantus 100 units/ml subcutaneous solution  40 unit(s) subcutaneous once a day   clopidogrel 75 mg oral tablet  1 tab(s) orally once a day   simvastatin 80 mg oral tablet  1 tab(s) orally once a day (in the morning)   klor-con 20 meq oral tablet, extended release  1 tab(s) orally once a day   torsemide 20 mg oral tablet  1 tab(s) orally once a day   metoprolol tartrate 50 mg oral tablet  1 tab(s) orally 2 times a day   norco 325 mg-5 mg oral tablet  1 tab(s) orally every 6 hours, As Needed - for Pain   bactrim ds 800 mg-160 mg oral tablet  1 tab(s) orally 2 times a day     Physician's Instructions:  Home Health? No  Treatments None   Dressing Care Change dressing with dry dressing on Saturday.   Diet Carbohydrate Controlled (ADA) Diet   Activity Limitations Elevate leg.  Minimal ambulation to left foot in surgical shoe   Return to Work Not Applicable   Time frame for Follow Up Appointment 1-2 weeks  Pt to f/u with Dr. Vickki Muff next Tuesday   Electronic Signatures: Samara Deist (MD)  (Signed 29-Jan-15 12:27)  Authored: ADMISSION DATE AND DIAGNOSIS, CHIEF COMPLAINT/HPI, PERTINENT [place], PATIENT INSTRUCTIONS   Last Updated: 29-Jan-15 12:27 by Samara Deist (MD)

## 2014-09-20 NOTE — Consult Note (Signed)
PATIENT NAME:  Todd Clay, Todd Clay MR#:  [id number] DATE OF BIRTH:  April 28, 1946  DATE OF CONSULTATION:  06/25/2013  REFERRING PHYSICIAN:   CONSULTING PHYSICIAN:  Wren Pryce A. Vickki Muff, DPM  REASON FOR CONSULTATION: Left foot abscess.   HISTORY OF PRESENT ILLNESS: This is a 69 year old gentleman who underwent a second toe amputation approximately 1 month ago for osteomyelitis of the second toe. He was doing fairly well until approximately a week and a half ago, developed a small opening dehiscence of his incision site. It progressively has gotten worse with surrounding erythema, cellulitis and purulence from his foot. He was seen in the outpatient clinic today, noted the purulent drainage with worsening erythema and recommended admission to the hospital for likely I and D of his left foot tomorrow. He does have a wound culture outpatient that is growing staph aureus that is resistant to penicillin with inducible clindamycin resistance that is susceptible to Bactrim.   PAST MEDICAL HISTORY: Insulin-dependent diabetes, neuropathy, coronary artery disease, status post PCI and stenting.   HOME MEDICATIONS: Aspirin, Plavix, gabapentin, Klor-Con, Lantus, metoprolol, simvastatin, torsemide.   ALLERGIES: No known drug allergies.   REVIEW OF SYSTEMS: The patient has had some lethargy per the wife. He denies any fever or chills. he does have neuropathy to the lower extremities.   LABORATORY DATA:  Outpatient white blood cell count was 9.9.    PHYSICAL EXAMINATION: GENERAL: He is alert and oriented.  VASCULAR: He has fully palpable DP and PT pulses to the lower extremity.   NEUROLOGIC: He is completely neuropathic.  DERMATOLOGIC: He is status post left second toe amputation with an open dehisced area with some surrounding granuloma. This probes deep with a scant amount of purulent drainage from the wound itself. There is erythema proximal to the midfoot.  MUSCULOSKELETAL: He has some mild edema to the left  forefoot region as well.   ASSESSMENT: Left foot abscess, status post left second toe amputation.   PLAN: I recommended I and D of his left foot. I discussed the risks, benefits, alternatives and complications associated with this. I will plan on performing this tomorrow. Agree with IV antibiotics. He has been growing a staph aureus that is resistant to penicillin. He does have oxacillin sensitivity but has clindamycin-inducible resistance. We will plan on I and D with continued monitoring and hopefully can discharge him within the next couple of days.   ____________________________ [name], DPM jaf:cs D: 06/25/2013 17:54:15 ET T: 06/25/2013 18:09:53 ET JOB#: [id number]  cc: Larkin Ina A. Vickki Muff, DPM, <Dictator> Linkin Vizzini DPM ELECTRONICALLY SIGNED 07/24/2013 19:45

## 2014-09-20 NOTE — H&P (Signed)
PATIENT NAME:  DELIO, SLATES MR#:  [id number] DATE OF BIRTH:  05/04/1946  DATE OF ADMISSION:  06/25/2013  REFERRING PHYSICIAN: Dr. Vickki Muff.   CHIEF COMPLAINT: Left lower extremity infection, abscess.   HISTORY OF PRESENT ILLNESS: A 69 year old male patient with history of hypertension, coronary artery disease with diabetic neuropathy, who was last in the hospital in December 2014 with left toe swelling, osteomyelitis and cellulitis status post amputation, presents to the hospital, sent in from podiatry, the office of Samara Deist, DPM, with left foot abscess. The patient has been taking Bactrim and ciprofloxacin with no response, developed an abscess and needs I and D.   The patient mentions that he has not been walking much, has been mainly elevating his leg trying to reduce the swelling which has not helped but has had good appetite. He mentions that his blood sugars have been fluctuating significantly, going into the 300s, in spite of taking the same dose of Lantus of 40 units daily in the morning for a long time.   During the last hospitalization, his blood sugars did drop to 58 while he was on sliding scale insulin and as he was n.p.o.   REVIEW OF SYSTEMS:  CONSTITUTIONAL: No fever, fatigue, weakness.  EYES: No blurred vision, pain, redness.  ENT:  No tinnitus, ear pain, hearing loss.  RESPIRATORY: No cough, wheeze or hemoptysis.  CARDIOVASCULAR: No chest pain, orthopnea or edema.  GASTROINTESTINAL: No nausea, vomiting, diarrhea, or abdominal pain.  GENITOURINARY: No dysuria, hematuria, frequency.  ENDOCRINE: No polyuria, nocturia, or thyroid problems. HEMOLYMPHATIC: No anemia, easy bruising, bleeding. INTEGUMENTARY: Has the left foot abscess and amputation site with ulcer.  MUSCULOSKELETAL: No joint swelling, redness, effusion of the large joints.  NEUROLOGIC: No focal weakness or seizures.  PSYCHIATRIC: No anxiety or depression.   PAST MEDICAL HISTORY:  1.  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  2.  Coronary artery disease, status post PCI.  3.  Anxiety.   SOCIAL HISTORY: The patient does not smoke. No alcohol. No illicit drugs.   FAMILY HISTORY: Positive for diabetes.   ALLERGIES: No known drug allergies.   HOME MEDICATIONS:  Include:  1.  Aspirin 325 mg daily.  2.  Plavix 75 mg daily.  3.  Gabapentin 300 mg three capsules 3 times a day.  4.  Potassium chloride 20 mEq by mouth daily.  5.  Lantus 40 units subcutaneous daily in the morning.  6.  Metoprolol 150 mg by mouth twice daily.  7.  Simvastatin 80 mg daily.  8.  Torsemide 20 mg today.  9.  Bactrim DS 800/160, one tablet 2 times a day.  10.  Ciprofloxacin 500 mg two times a day.   PHYSICAL EXAMINATION:  VITAL SIGNS: Temperature 97.8, pulse 66, respirations 18, blood pressure 129/61, saturating 92% on room air.  GENERAL: Obese, Caucasian male patient lying in bed, comfortable, conversational, cooperative with exam.  PSYCHIATRIC: Alert and oriented x3. Mood and affect appropriate. Judgment intact.  HEENT: Atraumatic, normocephalic. Oral mucosa moist and pink. External ears and nose normal. No pallor. No icterus. Pupils bilaterally equal and reactive to light.  NECK: Supple. No thyromegaly or palpable lymph nodes. Trachea midline. No carotid bruit, JVD.  CARDIOVASCULAR: S1, S2, without any murmurs. Peripheral pulses 2+. No edema.  RESPIRATORY: Normal work of breathing. Clear to auscultation on both sides.  GASTROINTESTINAL: Soft abdomen, nontender. Bowel sounds present. No hepatosplenomegaly palpable.  GENITOURINARY: No CVA tenderness or bladder distention.  SKIN: Warm and dry. Has left foot  ulcers, swelling and erythema.  NEUROLOGICAL: Motor strength five out of five in upper extremities. Cranial nerves II through XII are intact.  LYMPHATIC: No cervical lymphadenopathy.   LABORATORY STUDIES: Glucose of 464 and 467. Recent wound cultures from his left foot grew MSSA  sensitive to oxacillin, Bactrim, gentamicin, linezolid, and tigecycline.   EKG shows normal sinus rhythm. No acute changes.   ASSESSMENT AND PLAN:  1.  Left foot abscess. The patient did have osteomyelitis diagnosed during his previous admission. Presently has an abscess. Will have more input regarding the persistence of osteomyelitis during surgery. I will await postoperative note. We will start the patient on Ancef according to his recent culture results. The patient is afebrile, normal white count. We will draw blood cultures. Will get PT, PTT, CBC, and complete metabolic panel prior to surgery. If these labs are within normal limits with a normal EKG, the patient should be low-risk for surgery.  2.  Uncontrolled diabetes mellitus. Likely worsened secondary to his infection. We give him lower dose of Lantus tomorrow at 20 units as he will be n.p.o. Will need to try to titrate his Lantus dose according to his sliding scale insulin doses given. I suspect the patient will need a much higher dose of Lantus as his blood sugars are running in 400s in spite of taking his Lantus today morning. The patient will be on diabetic diet.  3.  Coronary artery disease, stable.  4.  Diabetic neuropathy. Continue Neurontin.  5.  Deep vein thrombosis prophylaxis with Lovenox.   CONSULTATION: Podiatry. Discussed the case with Dr. Vickki Muff.   CODE STATUS: FULL CODE.   TIME SPENT TODAY ON THIS CASE: Was 93 minutes.    ____________________________ Leia Alf Darryel Diodato, MD srs:np D: 06/25/2013 20:25:28 ET T: 06/25/2013 20:42:48 ET JOB#: [id number]  cc: Alveta Heimlich R. Cendy Oconnor, MD, <Dictator> Justin A. Vickki Muff, DPM Cletis Athens, MD [place] MD ELECTRONICALLY SIGNED 06/26/2013 16:07

## 2014-09-20 NOTE — Op Note (Signed)
PATIENT NAME:  Todd Clay, DEESE MR#:  [id number] DATE OF BIRTH:  Nov 03, 1945  DATE OF PROCEDURE:  05/20/2013  PREOPERATIVE DIAGNOSIS: Left second toe osteomyelitis.   POSTOPERATIVE DIAGNOSIS: Left second toe osteomyelitis.   PROCEDURE: Amputation of metatarsophalangeal joint, left second toe.   SURGEON: Dashawn Bartnick A. Vickki Muff, DPM.   ANESTHESIA: IV sedation with local.   HEMOSTASIS: None.   COMPLICATIONS: None.   SPECIMEN: Left second toe with osteomyelitis and wound culture.   ESTIMATED BLOOD LOSS: Minimal.   OPERATIVE INDICATIONS: This is a 69 year old gentleman who developed an ulcer to his left second toe that progressively has gotten worse. He has purulent drainage probing immediately down to bone in the distal tip as well as at the middle phalanx region. I discussed surgical intervention with him, and consent has been given for surgery.   OPERATIVE PROCEDURE: The patient was brought into the OR, placed on the operating table in the supine position. IV sedation was administered by the anesthesia team. Local sedation was administered around second toe. After sterile prep and drape, attention was directed to the second MTPJ where a fishmouth-type of incision was made circumferential around the second toe itself. A full-thickness incision was made down to the bone. This was then disarticulated at the MTPJ. All bleeders were Bovie cauterized. The area was flushed with copious amounts of irrigation. A wound culture was taken from the ulcerative site. At this time, layered skin closure was performed with a 4-0 Vicryl for the deep layer and a 3-0 nylon for skin. The patient tolerated the procedure and anesthesia well. He was transported from the OR to the PACU with all vital signs stable and neurovascular status intact. He will be readmitted to the floor. Will monitor his blood sugars overnight. Monitor his culture results and hopefully discharge in the next 1 to 2 days.    ____________________________ [name], DPM jaf:gb D: 05/20/2013 22:01:30 ET T: 05/21/2013 05:36:22 ET JOB#: [id number]  cc: Larkin Ina A. Vickki Muff, DPM, <Dictator> Xzavien Harada DPM ELECTRONICALLY SIGNED 06/20/2013 10:12

## 2014-09-20 NOTE — Op Note (Signed)
PATIENT NAME:  Todd Clay, DUESING MR#:  [id number] DATE OF BIRTH:  03/01/1946  DATE OF PROCEDURE:  07/24/2013  PREOPERATIVE DIAGNOSIS:  Osteomyelitis, left foot.  POSTOPERATIVE DIAGNOSIS: Osteomyelitis, left foot.    PROCEDURES:  1. Ultrasound guidance for vascular access to the right brachial vein.  2. Fluoroscopic guidance for placement of catheter.  3. Insertion of a single-lumen peripherally inserted central venous catheter, right arm.  SURGEON: Katha Cabal, MD  ANESTHESIA: Local.   ESTIMATED BLOOD LOSS: Minimal.   INDICATION FOR PROCEDURE: Requiring IV antibiotics greater than 5 days.    DESCRIPTION OF PROCEDURE: The patient's right arm was sterilely prepped and draped, and a sterile surgical field was created. The brachial vein was accessed under direct ultrasound guidance without difficulty with a micropuncture needle and permanent image was recorded. 0.018 wire was then placed into the superior vena cava. Peel-away sheath was placed over the wire. A single lumen peripherally inserted central venous catheter was then placed over the wire and the wire and peel-away sheath were removed. The catheter tip was placed into the superior vena cava and was secured at the skin at 37 cm with a sterile dressing. The catheter withdrew blood well and flushed easily with heparinized saline. The patient tolerated procedure well.    ____________________________ Katha Cabal, MD ggs:cs D: 07/24/2013 16:47:12 ET T: 07/24/2013 18:19:07 ET JOB#: [id number]  cc: Katha Cabal, MD, <Dictator> Katha Cabal MD ELECTRONICALLY SIGNED 07/31/2013 11:03

## 2014-09-20 NOTE — Op Note (Signed)
PATIENT NAME:  Todd Clay, ERNSBERGER MR#:  [id number] DATE OF BIRTH:  [birth year]  DATE OF PROCEDURE:  06/26/2013  PREOPERATIVE DIAGNOSIS: Left foot abscess.  POSTOPERATIVE DIAGNOSIS: Left foot abscess.   PROCEDURE: Incision and drainage of abscess, left second metatarsal region.   SURGEON: Haneefah Venturini A. Vickki Muff, DPM.   ANESTHESIA: MAC with local.   HEMOSTASIS: Epinephrine 1:200,000 infiltrated along the incision site.   COMPLICATIONS: None.   SPECIMEN: Deep wound culture, left forefoot.   ESTIMATED BLOOD LOSS: Minimal.   OPERATIVE INDICATIONS: This is a 69 year old gentleman who was admitted with a recent abscess with cellulitis into his left foot from a previous amputation site. He had undergone an amputation of the second toe and developed a wound dehiscence that progressively became worse with drainage. We have elected for surgical I and D of this area to try to clear this area up.   All risks, benefits, alternatives, and complications associated with the surgery were discussed with the patient and informed consent has been given.   OPERATIVE PROCEDURE: The patient was brought into the OR and placed on the operating table in the supine position. IV sedation was administered by the anesthesia team. The local sedation was administered as stated above. The left lower extremity was then prepped and draped in the usual sterile fashion.   Attention was directed to the dorsal aspect of the left second webspace region where an incision was made overlying the abscessed area. Full-thickness blunt dissection was taken down to the metatarsal itself. The metatarsal looked to be intact with no signs of any erosive changes. There was just a scant amount of a fibrotic infected plug of tissue that I was able to remove. A deep wound culture was taken from the area.   Probing further proximal did not reveal any purulent regions or deeper abscessed areas. I then flushed it out with a bulb syringe with a liter of  sterile saline with GU Irrigant.  The wound was then closed with skin layer closure with a 4-0 Vicryl with a small area packed with a 1/2-inch gauze packing. A well-compressed sterile bulky dressing was placed around the forefoot prior to placement of the final dressing. A DuoDERM patch was placed on the lateral fifth metatarsal head where a previous pressure-induced area was noticed. I finished up with a DuoDERM on the heel as well.   The patient tolerated the procedure and anesthesia well and was transported from the OR to the PACU with all vital signs stable and neurovascular status intact. I will be readmitting him back to the floor, will keep him on antibiotics, and plan to discharge him tomorrow.   ____________________________ [name], DPM jaf:np D: 06/26/2013 14:13:46 ET T: 06/26/2013 15:42:10 ET JOB#: [id number]  cc: Larkin Ina A. Vickki Muff, DPM, <Dictator> Avaeh Ewer DPM ELECTRONICALLY SIGNED 07/24/2013 19:45

## 2014-09-20 NOTE — H&P (Signed)
PATIENT NAME:  Todd Clay, Todd Clay MR#:  [id number] DATE OF BIRTH:  1945-09-10  DATE OF ADMISSION:  07/24/2013  PRIMARY CARE PHYSICIAN:  Cletis Athens, MD  REQUESTING PHYSICIAN: Pete Glatter. Vickki Muff, DPM  CHIEF COMPLAINT:  Left lower extremity infection.   HISTORY OF PRESENT ILLNESS: The patient is a 69 year old male with a known history of diabetes, hypertension, coronary artery disease. He is being admitted for left foot osteomyelitis. The patient follows with Dr. Vickki Muff in the office where he was found to have osteomyelitis of the left second and third metatarsal phalangeal joints and Dr. Vickki Muff requested direct admit for surgical debridement and placement of antibiotic with bed space planned for later today.  The patient reports having blood sugar fluctuation anywhere from 54 before breakfast this morning and after lunch without eating he was up to 260s and has been labile typically that same way.  Sugars would run low but going up as the day ends.  He does take Lantus early in the morning.    PAST SURGICAL HISTORY:   1.  Diabetes with neuropathy.  2.  Coronary artery disease, status post PCI.  3.  Anxiety.  SOCIAL HISTORY: No smoking, alcohol or illicit drugs.   FAMILY HISTORY: Positive for diabetes.   ALLERGIES: No known drug allergies.   MEDICATIONS AT HOME:   1.  Aspirin 325 mg p.o. daily.  2.  Bactrim double-strength 1 tablet p.o. twice a day.  3.  Plavix 75 mg p.o. daily. 4.  Gabapentin 300 mg 2 capsules p.o. 3 times a day.  5.  Klor-Con 20 mEq p.o. daily.   6.  Insulin Lantus 40 units subcutaneous daily.  7.  Metoprolol 50 mg p.o. b.i.d.  8.  Norco 5/325 one tables p.o. every 6 hours as needed.  9.  Simvastatin 80 mg p.o. daily.  10.  Torsemide 20 mg p.o. daily.   REVIEW OF SYSTEMS: CONSTITUTIONAL: No fever, fatigue, weakness.  EYES: No blurred or double vision.  ENT: No tinnitus or ear pain.  RESPIRATORY:  No cough, wheezing or hemoptysis. CARDIOVASCULAR:  No chest pain,  orthopnea, edema. GASTROINTESTINAL:  No nausea, vomiting or diarrhea.   GENITOURINARY:   No dysuria or hematuria.   ENDOCRINE:  No polyuria or nocturia. HEMATOLOGY:  No anemia or easy bruising.   SKIN:  No rash or lesion. He has left foot osteomyelitis, he has his left foot wrapped in a dressing.  MUSCULOSKELETAL: Possible osteomyelitis of the left foot second and third metatarsophalangeal.  NEUROLOGIC: No tingling, numbness, weakness.  PSYCHIATRIC: Positive anxiety. No history of depression.   PHYSICAL EXAMINATION: VITAL SIGNS: Temperature 97.6, heart rate 65 per minute, respirations 18 per minute, blood pressure 121/65 mmHg, saturating 99% on room air.  GENERAL:  The patient is a 69 year old male lying in the bed comfortably without any acute distress.  EYES: Pupils are round and equal to light.  Sclerae anicteric. Extraocular muscles intact.  HEENT:  Atraumatic, normocephalic. Oropharynx and nasopharynx clear.   NECK:  Supple, no jugular venous distension, normal thyroid, no tenderness.  LUNGS: Clear to auscultation bilaterally. No wheezing, rales, rhonchi or crepitation.  CARDIOVASCULAR: S1, S2 normal. No murmurs, rubs or gallop.  ABDOMEN: Soft, nontender, nondistended. Bowel sounds are normal, no organomegaly or masses.  EXTREMITIES: He has a no pedal edema, cyanosis or clubbing. He has a draining abscess and surrounding erythema in the left forefoot.  It is dressed up now.  NEUROLOGICAL: Cranial nerves II through XII intact.  Muscle strength 5 out of 5  in all extremities. Sensation intact.  PSYCHIATRIC: The patient is alert and oriented x 3.   LABORATORY, DIAGNOSTIC AND RADIOLOGICAL DATA:  Normal BMP except BUN of 25, creatinine 1.47, blood sugar 257, sodium 131. CBC within normal limits except hemoglobin of 11.9, hematocrit 35.7.   IMPRESSION AND PLAN: 1.  Preoperative medical clearance. The patient is low risk for planned surgery. The patient is medically clear. The biggest risk  could be controlling his blood sugar and healing aspect considering his diabetes.  2.  Diabetes with labile blood sugar.  We will keep him on sliding scale for now and resume Lantus, mainly at night once he starts eating. We will check hemoglobin A1c. 3.  Hypertension. Blood pressure seems fairly well controlled at this time. Will monitor.  4.  Left foot osteomyelitis. Was started on IV vancomycin and Zosyn. Peripherally inserted central catheter line will be placed. Consult Dr. Ola Spurr,  further management per podiatry, Dr. Vickki Muff. Likely plan for debridement and placement of antibiotics within the dates best per him.   5.  Coronary artery disease. His aspirin and Plavix can be resumed after surgery.   TOTAL TIME TAKING CARE OF THIS PATIENT: 55 minutes.     ____________________________ Lucina Mellow. Manuella Ghazi, MD vss:cs D: 07/24/2013 14:52:00 ET T: 07/24/2013 15:34:20 ET JOB#: [id number]  cc: Abelardo Seidner S. Manuella Ghazi, MD, <Dictator> Cletis Athens, MD Pete Glatter. Vickki Muff, DPM Lucina Mellow [facility] MD ELECTRONICALLY SIGNED 07/31/2013 21:28

## 2014-09-20 NOTE — Discharge Summary (Signed)
PATIENT NAME:  Todd Clay, WOOLUM MR#:  [id number] DATE OF BIRTH:  February 16, 1946  DATE OF ADMISSION:  07/24/2013 DATE OF DISCHARGE:  07/26/2013  DISCHARGE DIAGNOSIS:  Left second and third metatarsal osteomyelitis, status post amputation, requiring four weeks of intravenous antibiotics through PICC line.   SECONDARY DIAGNOSES: 1.  Diabetes with neuropathy.  2.  Coronary artery disease.  3.  Anxiety.   CONSULTATIONS:  Podiatry, Dr. Vickki Muff.   PROCEDURES AND RADIOLOGY:  Resection of second and third metatarsal on the left foot on the 25th of February by Dr. Vickki Muff.   HISTORY AND SHORT HOSPITAL COURSE:  The patient is a 69 year old male with above-mentioned medical problems who was admitted for left second and third metatarsal osteomyelitis.  Please see Dr. Trena Platt dictated history and physical for further details.  The patient was started on IV antibiotic.  Podiatry consultation was obtained with Dr. Vickki Muff who recommended amputation which was performed on the 25th of February.  Subsequently infectious disease consultation was obtained with Dr. Ola Spurr who recommended PICC line placement with four weeks of IV antibiotic and the patient was discharged home on the 27th of February as he was feeling much better with PICC line placement and IV antibiotic course for a total of four weeks.    PHYSICAL EXAMINATION:  VITAL SIGNS:  On the date of discharge, his vital signs are as follows:  Temperature 97.7, heart rate 64 per minute, respirations 19 per minute, blood pressure 132/67 mmHg.  He was saturating 96% on room air.  Pertinent physical examination on the date of discharge:  CARDIOVASCULAR:  S1, S2 normal.  No murmurs, rubs, gallops.  LUNGS:  Clear to auscultation bilaterally.  No wheezing, rales, rhonchi, or crepitation.  ABDOMEN:  Soft, benign.  NEUROLOGIC:  Nonfocal examination.  LEFT FOOT:  Had minimal erythema.  No purulence.  Incision was intact.  Dressing was changed on the date of discharge by  Dr. Vickki Muff.  All other physical examination remained at baseline.   DISCHARGE MEDICATIONS: 1.  Aspirin 325 mg by mouth daily.  2.  Gabapentin 300 mg 3 capsules by mouth 3 times a day.  3.  Insulin Lantus 40 units subQ daily.  4.  Plavix 75 mg by mouth daily.  5.  Simvastatin 80 mg by mouth daily.  6.  Klor-Con 20 mEq by mouth daily.  7.  Torsemide 20 mg by mouth daily.  8.  Metoprolol 50 mg by mouth twice daily.  9.  Norco 5/325 1 tablet by mouth every six hours as needed.  10.  Ertapenem 1 gram IV daily through PICC line.   DISCHARGE DIET:  Low sodium.   DISCHARGE ACTIVITY:  As tolerated.   DISCHARGE INSTRUCTIONS AND FOLLOW-UP:  1.  The patient was instructed to use front-wheeled walker for walking for the time being until all the healing occurs on the left foot.  2.  He will need follow-up with Dr. Vickki Muff as scheduled in one week.  He will get dressing changes on Monday/Tuesday by home health nursing.   3.  He will need follow-up with Dr. Ola Spurr from infectious disease in 1 to 2 weeks.  4.  He will need follow-up with Dr. Lavera Guise, his primary care physician in 4 to 6 weeks.  He will get PICC line care per protocol.  5.  He will need CBC and complete metabolic panel checked once a week with results forwarded to Dr. Ola Spurr.   Total time discharging this patient was 55 minutes.   The patient  was set up to get home health, physical therapy nurse and nursing aid by care management.   ____________________________ Lucina Mellow. Manuella Ghazi, MD vss:ea D: 07/27/2013 16:36:49 ET T: 07/27/2013 17:43:14 ET JOB#: [id number]  cc: Kyonna Frier S. Manuella Ghazi, MD, <Dictator> Cletis Athens, MD Cheral Marker. Ola Spurr, MD Pete Glatter. Vickki Muff, DPM Lucina Mellow [facility] MD ELECTRONICALLY SIGNED 07/31/2013 21:28

## 2014-09-20 NOTE — Discharge Summary (Signed)
PATIENT NAME:  Todd Clay, Todd Clay MR#:  [id number] DATE OF BIRTH:  1945/11/04  DATE OF ADMISSION:  05/18/2013 DATE OF DISCHARGE:  05/21/2013  CONSULTANTS: Larkin Ina A. Vickki Muff, DPM, from podiatry.   PRIMARY CARE PHYSICIAN: Cletis Athens, MD  CHIEF COMPLAINT:  Left toe swelling.   DISCHARGE DIAGNOSES: 1.  Left toe swelling second digit on the left from osteomyelitis and cellulitis.  2.  Status post amputation.  3.  Labile diabetes.  4.  Hypertension.  5.  Mild renal failure.  6.  Hypokalemia.  7.  Mild hyponatremia.  8.  Coronary artery disease.  9.  Neuropathy  10.  History of anxiety.   DISCHARGE MEDICATIONS:  Percocet 5/325 one tab every 4 to 6 hours as needed for pain x 18 tabs, Levaquin 500 mg for 10 days daily, metoprolol tartrate 50 mg 2 times a day, torsemide 20 mg once a day, Klor-Con 20 mEq a once a day, simvastatin 80 mg daily, Plavix 75 mg daily, Lantus 40 units once a day, gabapentin 300 mg 3 caps 3 times a day, aspirin 325 mg daily. Please keep dry bandages.   DIET: Low sodium, low fat, low cholesterol, ADA diet.   ACTIVITY: As tolerated.   FOLLOWUP: Please follow with PCP within 1 to 2 weeks. Please follow with Dr. Vickki Muff next  Wednesday at [place] office.  Wear a postop shoe at all times.    DISPOSITION: Home.   SIGNIFICANT LABS AND IMAGING: Initial BUN 23, creatinine 1.31, sodium 129, potassium 4.1. White count 8.4. Last sodium of 134 with creatinine of 1.13, sodium 39. Hemoglobin A1c 11.5. ESR was 65. Hemoglobin of 12, platelets of 249 on arrival. Blood cultures no growth to date. Wound cultures from the toe shows Enterobacter cloacae which is moderate growth, light growth of staph aureus, unclear if this is MRSA or not, and moderate Strep agalactiae.  Wound culture during toe amputation shows colonies too small to read. X-ray of the foot shows soft tissue swelling involving the left second digit, no definitive evidence for osteomyelitis.   HISTORY OF PRESENT ILLNESS AND  HOSPITAL COURSE: For full details of H and P, please the dictation on 12/20 by Dr. Lavetta Nielsen but, briefly, this is a 69 year old pleasant male with diabetes, neuropathy, CAD, status post stenting in the past, who comes in with swelling, redness, which was gradual onset but it was getting worse with some erythema with some drainage. He did not have a significant fever or leukocytosis, was admitted hospitalist service initially under observation for suspected cellulitis. Given history of MRSA in the past and also diabetic, was started on broad-spectrum antibiotics including vancomycin and Zosyn, and podiatry was consulted. The patient was seen by podiatry on the 21st and per podiatry the infection was likely osteomyelitis as the purulent drainage and probing directly went down to the bone. He underwent a toe amputation on the 22nd. While in the hospital, he had labile blood sugars. His sugars varied from the 40s to 400s. His hemoglobin A1c was checked and it was 11.5, and the patient states his sugars are usually in the 200s and that is good for him. He also had mild hyponatremia and mild renal failure, both of which have resolved with fluids. At this point, the blood cultures have been negative and the wound cultures which were taken in the ER on the day of admission are dictated above. It is unclear if the staph aureus which is light growth is MSSA  or not but, at this point, given  the predominant growth as Enterobacter, he will be discharged on Levaquin for 10 days. He will be followed with Dr. Vickki Muff as an outpatient, who will also be checking in on the wound cultures.   PHYSICAL EXAMINATION: VITAL SIGNS: On the day of discharge, his temperature is 98.4, pulse rate 66, respiratory rate 17, blood pressure 111/65, O2 sat 98%.  GENERAL:  The patient is a well developed male sitting in bed, dressed, ready go home.  HEENT: Normocephalic, atraumatic.  LUNGS: Clear.  CARDIOVASCULAR:  The patient has regular S1, S2.   ABDOMEN: Soft, nontender.  EXTREMITIES:  No pitting edema. He has a boot apply to his left foot with bandages applied to the toe area.   TOTAL TIME SPENT:  35 minutes.   ____________________________ Vivien Presto, MD sa:cs D: 05/21/2013 16:17:00 ET T: 05/21/2013 18:17:40 ET JOB#: [id number]  cc: Vivien Presto, MD, <Dictator> Cletis Athens, MD Pete Glatter. Vickki Muff, DPM  Vivien Presto MD ELECTRONICALLY SIGNED 06/11/2013 11:09

## 2014-09-20 NOTE — Discharge Summary (Signed)
PATIENT NAME:  Todd Clay, Todd Clay MR#:  [id number] DATE OF BIRTH:  January 15, 1946  DATE OF ADMISSION:  06/25/2013 DATE OF DISCHARGE:  06/27/2013  PRESENTING COMPLAINT: Left lower extremity infection with abscess.   DISCHARGE DIAGNOSES:  1.  Left foot abscess status post incision and drainage by Dr. Vickki Muff.  2.  Type 2 diabetes.  3.  Hypertension.   PROCEDURES: I and D of the left foot at the 2nd toe incision abscess by Dr. Vickki Muff.   MEDICATIONS:  1.  Aspirin 325 mg p.o. daily.  2.  Gabapentin 300 mg three capsules 3 times a day.  3.  Lantus 40 units daily.  4.  Plavix 75 mg daily.  5.  Simvastatin 80 mg p.o. daily.  6.  kcl 20 mEq extended release p.o. daily.  7.  Torsemide 20 mg p.o. daily.  8.  Metoprolol 50 mg one tablet b.i.d.  9.  Norco 5/325 one every 6 hours as needed.  10.  Bactrim double strength 1 tablet b.i.d.   FOLLOWUP: Dr. Vickki Muff on February 3.   DIET: ADA 1800 calorie diet.   Dressing changes per Dr. Vickki Muff.   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 About a month ago came in with increasing discharge from the incision site, was admitted with:  1.  Left foot second toe incision site abscess. The patient did have osteomyelitis during previous admission. He was started on IV cefazolin. The wound culture grew MS Staph aureus which is methicillin sensitive. The patient was seen by Dr. Vickki Muff who did I and D. Tolerated the procedure well. White count remained stable. Blood cultures remained negative. He was put on p.o. Bactrim by Dr. Vickki Muff who will see him as outpatient.  2.  Type 2 diabetes. Home meds including insulin were continued.  3.  Coronary artery disease, stable. The patient was on aspirin.  4.  Diabetic neuropathy. Continue Neurontin.   Hospital stay otherwise remained stable.   CODE STATUS: THE PATIENT REMAINED A FULL CODE.   TIME SPENT: 40  minutes.    ____________________________ Hart Rochester Posey Pronto, MD sap:np D: 06/27/2013 15:43:33 ET T: 06/27/2013 17:37:07 ET JOB#: [id number]  cc: Lafonda Patron A. Posey Pronto, MD, <Dictator> Ilda Basset MD ELECTRONICALLY SIGNED 07/11/2013 13:17

## 2014-09-20 NOTE — Consult Note (Signed)
PATIENT NAME:  Todd Clay, ARVELO MR#:  [id number] DATE OF BIRTH:  May 26, 1946  DATE OF CONSULTATION:  07/24/2013  REFERRING PHYSICIAN:  Dr. Vickki Muff with podiatry CONSULTING PHYSICIAN:  Cheral Marker. Ola Spurr, MD  REASON FOR CONSULTATION:  Osteomyelitis and abscess.   HISTORY OF PRESENT ILLNESS:  This is a 69 year old gentleman with poorly controlled diabetes who was admitted February 25th with cellulitis and abscess at the site of the recent talar amputation.  The patient was admitted for IV antibiotics as well as repeat surgery.  The patient's last operation was 06/26/2013 when he had I and D of a left foot abscess.  That site had been an abscess at the site of a prior amputation.  The patient underwent local extensive debridement by Dr. Vickki Muff and was discharged on January 29th on Bactrim.  Cultures from that surgery ended up growing methicillin sensitive Staphylococcus aureus.  Of note, in December when he had surgery and the left second toe he grew Staphylococcus aureus, group B strep and Enterobacter.   Currently, the patient reports drainage from the foot.  He has had no pain as he has peripheral neuropathy at the site.  He has had no fevers, chills or night sweats.   PAST MEDICAL HISTORY: 1.  Diabetes, poorly controlled.  2.  Peripheral neuropathy.  3.  Coronary artery disease, status post PCI.  4.  Anxiety.   SOCIAL HISTORY:  The patient does not smoke, drink or use drugs.   FAMILY HISTORY:  Positive for diabetes.   ALLERGIES:  No known drug allergies.   MEDICATIONS AT HOME:  Aspirin, Bactrim, Plavix, gabapentin, potassium chloride, Lantus, metoprolol, Norco, simvastatin, torsemide.   REVIEW OF SYSTEMS:  Eleven systems reviewed and negative except as per HPI.   PHYSICAL EXAMINATION: VITAL SIGNS:  Temperature 97.9, pulse 61, blood pressure 114/65, respirations 18, sat 96% on room air.  GENERAL:  He is pleasant, interactive, lying in bed in no acute distress.  HEENT:  Pupils equal,  round, reactive to light and accommodation.  Extraocular movements are intact.  Sclerae are anicteric.  Oropharynx clear.  NECK:  Supple.  HEART:  Regular.  LUNGS:  Clear to auscultation bilaterally.  ABDOMEN:  Soft, nontender.  VASCULAR ACCESS:  He has a PICC line in his right upper extremity that was placed today.  Right lower extremity has a prior amputation of his toes which will heal.  Left has an amputation site that I am able to express pus from.  He also has a wound on the plantar surface of his foot that is not draining any pus, but has drained in the past.  NEUROLOGIC:  He is alert and oriented x 3.  Grossly nonfocal neuro exam.   LABORATORY, DIAGNOSTIC AND RADIOLOGICAL DATA:  Micro data is reviewed back to December through December 21st, culture from the left second toe grew Enterobacter, staph aureus and group B strep.  Culture from his surgery in January grew methicillin sensitive Staph aureus.  Blood cultures pending from admission.  White blood count 7.6, hemoglobin 11.9, platelet count of 230.  Renal function shows a creatinine of 1.47.  Most recent hemoglobin A1c done January 27th is 10.7.  Sed rate done on December 20th was 65.   IMPRESSION:  A 69 year old with poorly controlled diabetes with severe peripheral neuropathy admitted now with left abscess and likely osteomyelitis at the site of a prior amputation in December.  In December he grew group B strep Staphylococcus aureus and Enterobacter.  At his most recent surgery  in January he grew methicillin sensitive Staphylococcus aureus.  He has been treated with Bactrim, but the wound has progressed and is draining pus.  He is going to the Operating Room for incision and drainage and possible removal of part of the metatarsal head.   RECOMMENDATIONS:  At this point, I would recommend IV antibiotics through a PICC line.  We can adjust them based on sensitivities and organisms that grow, but at this point I would recommend continuing the  vancomycin and Zosyn.  Since he has not grown methicillin resistant Staphylococcus aureus in the past, if his cultures are negative for methicillin resistant Staphylococcus aureus we could consider discharging him on coverage for methicillin sensitive Staphylococcus aureus as well as a group B strep and Enterobacter that he grew before.  He would likely also benefit from anaerobic coverage.   I would suggest at least a four week IV course followed by oral antibiotic coverage until this is healed.   I will check a sedimentation rate in the morning.   Thank you for the consult.  I will be glad to follow with you and I can see him as an outpatient when he is discharged.    ____________________________ Cheral Marker. Ola Spurr, MD dpf:ea D: 07/24/2013 22:18:09 ET T: 07/24/2013 22:44:42 ET JOB#: [id number]  cc: Cheral Marker. Ola Spurr, MD, <Dictator> Yaretsi Humphres Ola Spurr MD ELECTRONICALLY SIGNED 07/24/2013 23:26

## 2014-09-24 ENCOUNTER — Encounter: Payer: Self-pay | Admitting: Endocrinology

## 2014-09-24 ENCOUNTER — Ambulatory Visit (INDEPENDENT_AMBULATORY_CARE_PROVIDER_SITE_OTHER): Payer: PPO | Admitting: Endocrinology

## 2014-09-24 VITALS — BP 120/68 | HR 69 | Temp 97.8°F | Resp 16 | Ht 68.0 in | Wt 188.4 lb

## 2014-09-24 DIAGNOSIS — E1065 Type 1 diabetes mellitus with hyperglycemia: Secondary | ICD-10-CM

## 2014-09-24 DIAGNOSIS — E1042 Type 1 diabetes mellitus with diabetic polyneuropathy: Secondary | ICD-10-CM | POA: Diagnosis not present

## 2014-09-24 DIAGNOSIS — IMO0002 Reserved for concepts with insufficient information to code with codable children: Secondary | ICD-10-CM

## 2014-09-24 DIAGNOSIS — E785 Hyperlipidemia, unspecified: Secondary | ICD-10-CM | POA: Diagnosis not present

## 2014-09-24 LAB — COMPREHENSIVE METABOLIC PANEL
ALK PHOS: 77 U/L (ref 39–117)
ALT: 11 U/L (ref 0–53)
AST: 6 U/L (ref 0–37)
Albumin: 3.8 g/dL (ref 3.5–5.2)
BUN: 20 mg/dL (ref 6–23)
CO2: 28 meq/L (ref 19–32)
Calcium: 9.1 mg/dL (ref 8.4–10.5)
Chloride: 103 mEq/L (ref 96–112)
Creatinine, Ser: 1.09 mg/dL (ref 0.40–1.50)
GFR: 71.4 mL/min (ref >60.00)
Glucose, Bld: 357 mg/dL — ABNORMAL HIGH (ref 70–99)
Potassium: 4.4 mEq/L (ref 3.5–5.1)
SODIUM: 135 meq/L (ref 135–145)
TOTAL PROTEIN: 6.5 g/dL (ref 6.0–8.3)
Total Bilirubin: 0.5 mg/dL (ref 0.2–1.2)

## 2014-09-24 LAB — TSH: TSH: 0.84 u[IU]/mL (ref 0.35–4.50)

## 2014-09-24 LAB — HEMOGLOBIN A1C: Hgb A1c MFr Bld: 8.3 % — ABNORMAL HIGH (ref 4.6–6.5)

## 2014-09-24 NOTE — Assessment & Plan Note (Signed)
Managed by his cardiologist. Continue current statin.   Asked him to fwd his report to me for review.

## 2014-09-24 NOTE — Patient Instructions (Addendum)
Please try to increase the frequency of your FS checks to atleast 3 times daily.   Continue Lantus at 20 units daily.  Change regular insulin to 6 units BF, 6 units lunch  And 4 units with supper.  Don't take regular insulin at night time.  Labs today  Please come back for a follow-up appointment in 3 months

## 2014-09-24 NOTE — Assessment & Plan Note (Addendum)
Recent A1c well above target. Update today.  No recent sugars for review. 2 recent episodes of severe hypoglycemia in early morning requiring assistance.   Discussed hypoglycemia and appropriate correction. He reports that he would like to have the back up glucagon emergency kit. He has refills on this.  Tried to convince him to check his sugars and help him understand the value of daily glucose monitoring.  He reports that he will ask his wife's help to check his sugars again.  Discussed using diagnostic/personal use CGM as well, due to hypoglycemia- he declined.    He will try to work on improving his lifestyle factors.   Update TSH as well today given increased day time sleepiness, but explained that these could be due to lows, neurontin versus high carb intake  Patient Instructions  Please try to increase the frequency of your FS checks to atleast 3 times daily.   Continue Lantus at 20 units daily.  Change regular insulin to 6 units BF, 6 units lunch  And 4 units with supper.  Don't take regular insulin at night time.  Labs today  Please come back for a follow-up appointment in 3 months

## 2014-09-24 NOTE — Progress Notes (Signed)
Reason for visit-  Todd Clay is a 69 y.o.-year-old male,  Here for management of  likelyType 1 diabetes, uncontrolled, with complications ( ? Stage 2 CKD, neuropathy s/p toe amputations, macrovascular complications of CAD s/p CABG, hx retinal detachment right eye ). GAD negative , but low Cpep October 2015   HPI- Patient has been diagnosed with diabetes  at age 22. Recalls being tried on oral medications, but has mainly been on insulin therapy. Tried N, R and 70/30, Novolog in the past  * at last visit in Dec 2015, I had adjusted his lantus down and asked him to start Humalog. He reports that Humalog was not covered by his plan and never started it.  *since last time had had 2 severe hypoglycemia episodes  Pt is currently on a regimen of: - Lantus 20 units morning<<increased from 18 units on his own - Novolin R at 6 units qac, using three times daily *Patient reports that his sugars drops really fast mainly at night time, hence doesn't correct sugars unless above the 200 range at bedtime.   Not taking regular insulin at bedtime now.  Last hemoglobin A1c was: Lab Results  Component Value Date   HGBA1C 10.2* 06/19/2014   HGBA1C 10.1* 02/14/2014   HGBA1C 10.4* 11/18/2013     Pt checks his sugars 1-2 times a  Day usually. Uses One Touch mini glucometer.  By sugar log they are: days that he is not checking as well-log is from March- no recent reads  PREMEAL Breakfast Lunch Dinner Bedtime Overall  Glucose range: 47-217 129-193 47-190 64-198 March readings Lately sugars have been 129-150  Mean/median:        POST-MEAL PC Breakfast PC Lunch PC Dinner  Glucose range:     Mean/median:      *reports that wife not checking enough for him. He explains that he doesn't have the dexterity or the desire to check frequently as he has had this disease for a long time and his age is advancing.  Frequently wastes test strips due to error messages and problems with getting enough blood to put  on test strip  Hypoglycemia-  2 recent episodes of syncope after hypoglycemia in the morning, FS was 24 and 48. EMS called. Sugars prior night were okay. No extra exertion/exercise   he has hypoglycemia awareness at 70.   Dietary habits- eats three times daily. Eats Chicken grilled, fish. Salads, beans. Tries to limit carbs, sweetened beverages, sodas, desserts. Done good with this since last time. Wife asks him to eat more at night time these days and he has gained weight as a result Exercise- no formal exercise. Active around the house.  Weight -  Wt Readings from Last 3 Encounters:  09/24/14 188 lb 6.4 oz (85.458 kg)  07/31/14 184 lb 4 oz (83.575 kg)  06/19/14 185 lb 8 oz (84.142 kg)    Diabetes Complications-  Nephropathy- Yes, ? Stage 2  CKD, last BUN/creatinine-  Lab Results  Component Value Date   BUN 23 06/19/2014   CREATININE 1.13 06/19/2014   Lab Results  Component Value Date   GFR 68.55 06/19/2014   MICRALBCREAT 3.8 11/18/2013     Retinopathy- ?, Last DEE was in the last year. Reports left eye is fine. Right eye - Retinal detachment remote history. Is due for follow back Neuropathy- has sensory loss over distal feet. Known neuropathy. Treated with neurontin. Prior osteomyelitis right,  with  2 toe amputations. Left toe amputations. Wears DM shoes.  Sees podiatry.  Associated history - Has CAD, now s/p CABG . No prior stroke. No known hypothyroidism. his last TSH was  No results found for: TSH  Reports being sleepy all the time. No other cold intolerance or excess weight gain  Hyperlipidemia-  his last set of lipids were- Currently on Zocor 80 mg. Tolerating well.  No recent lipids for review. Cardiologist monitoring lipids, Dr Jerilee Field follow up appt March 2015 No results found for: CHOL  Blood Pressure/HTN- Patient's blood pressure is well controlled on current regimen that includes BB.   I have reviewed the patient's past medical history, medications and  allergies.    Current Outpatient Prescriptions on File Prior to Visit  Medication Sig Dispense Refill  . aspirin EC 81 MG tablet Take 81 mg by mouth 2 (two) times daily.    . clopidogrel (PLAVIX) 75 MG tablet Take 1 tablet (75 mg total) by mouth daily with breakfast. 30 tablet 6  . gabapentin (NEURONTIN) 300 MG capsule Take 3 capsules (900 mg total) by mouth 3 (three) times daily. 270 capsule 6  . glucagon (GLUCAGON EMERGENCY) 1 MG injection Inject 1 mg into the muscle once as needed (in case of severe hypoglycemia). 1 each 12  . glucose blood (ONE TOUCH ULTRA TEST) test strip Use as instructed 4 times daily, Dx code: 250.02 120 each 12  . insulin glargine (LANTUS) 100 UNIT/ML injection Inject 0.18 mLs (18 Units total) into the skin daily. 10 mL 5  . Insulin Pen Needle (BD PEN NEEDLE NANO U/F) 32G X 4 MM MISC Use for insulin administration three times daily 100 each 11  . insulin regular (NOVOLIN R RELION) 100 units/mL injection Inject 0.06 mLs (6 Units total) into the skin 3 (three) times daily before meals. 10 mL 2  . Lancets (ONETOUCH ULTRASOFT) lancets Use as instructed four times daily, dx code 250.02 120 each 12  . metoprolol tartrate (LOPRESSOR) 25 MG tablet Take 0.5 tablets (12.5 mg total) by mouth 2 (two) times daily. 30 tablet 0  . potassium chloride SA (K-DUR,KLOR-CON) 20 MEQ tablet Take 1 tablet (20 mEq total) by mouth daily. Take Monday, Wednesday and friday 90 tablet 0  . simvastatin (ZOCOR) 80 MG tablet Take 1 tablet (80 mg total) by mouth daily at 6 PM. 90 tablet 0  . torsemide (DEMADEX) 20 MG tablet Take 1 tablet (20 mg total) by mouth daily. Take Monday, Wednesday and Friday 15 tablet 0   No current facility-administered medications on file prior to visit.   No Known Allergies  Review of Systems- [ x ]  Complains of    [  ]  denies [ x ] Recent weight change [  ]  Fatigue [  ] polydipsia [  ] polyuria [  ]  nocturia [  ]  vision difficulty [  ] chest pain [  ] shortness of  breath [  ] leg swelling [ ] cough [  ] nausea/vomiting [  ] diarrhea [  ] constipation [  ] abdominal pain [  ]  tingling/numbness in extremities [  ]  concern with feet ( wounds/sores)   PE: BP 120/68 mmHg  Pulse 69  Temp(Src) 97.8 F (36.6 C) (Oral)  Resp 16  Ht 5' 8" (1.727 m)  Wt 188 lb 6.4 oz (85.458 kg)  BMI 28.65 kg/m2  SpO2 96% Wt Readings from Last 3 Encounters:  09/24/14 188 lb 6.4 oz (85.458 kg)  07/31/14 184 lb 4 oz (83.575 kg)  06/19/14 185 lb 8 oz (84.142 kg)   Exam: deferred    ASSESSMENT AND PLAN: Problem List Items Addressed This Visit      Endocrine   Diabetes type 1, uncontrolled - Primary    Recent A1c well above target. Update today.  No recent sugars for review. 2 recent episodes of severe hypoglycemia in early morning requiring assistance.   Discussed hypoglycemia and appropriate correction. He reports that he would like to have the back up glucagon emergency kit. He has refills on this.  Tried to convince him to check his sugars and help him understand the value of daily glucose monitoring.  He reports that he will ask his wife's help to check his sugars again.  Discussed using diagnostic/personal use CGM as well, due to hypoglycemia- he declined.    He will try to work on improving his lifestyle factors.   Update TSH as well today given increased day time sleepiness, but explained that these could be due to lows, neurontin versus high carb intake  Patient Instructions  Please try to increase the frequency of your FS checks to atleast 3 times daily.   Continue Lantus at 20 units daily.  Change regular insulin to 6 units BF, 6 units lunch  And 4 units with supper.  Don't take regular insulin at night time.  Labs today  Please come back for a follow-up appointment in 3 months                    Relevant Orders   Comprehensive metabolic panel   TSH   Hemoglobin A1c     Nervous and Auditory   Diabetic polyneuropathy    He  was encouraged to perform regular self foot care. Follow back with podiatry for callus management. Wear DM shoes. Continue neurontin for neuropathy.                  Other   Hyperlipidemia    Managed by his cardiologist. Continue current statin.   Asked him to fwd his report to me for review.             - Return to clinic in 3 months with sugar log/meter.  [facility] 09/24/2014 11:28 AM

## 2014-09-24 NOTE — Progress Notes (Signed)
Pre visit review using our clinic review tool, if applicable. No additional management support is needed unless otherwise documented below in the visit note. 

## 2014-09-24 NOTE — Assessment & Plan Note (Signed)
He was encouraged to perform regular self foot care. Follow back with podiatry for callus management. Wear DM shoes. Continue neurontin for neuropathy.

## 2014-10-08 ENCOUNTER — Telehealth: Payer: Self-pay

## 2014-10-08 NOTE — Telephone Encounter (Signed)
Symptoms do sound concerning for neurologic event Would check blood pressure to rule out orthostasis. This was a problem in the past If blood pressure stable, would go to the emergency room for further evaluation

## 2014-10-08 NOTE — Telephone Encounter (Signed)
Pt wife is calling states pt "is about to pass out all the time", states it just last a few minutes. States he almost falls. States he did this 30 minutes ago, and it happened last wed, and this past Sunday. Please call

## 2014-10-08 NOTE — Telephone Encounter (Signed)
I spoke with the patient's wife. She reports intermittent episodes of the patient's legs giving out/ feeling rubbery. He is not able to move with out assistance. Sometimes this will occur if he goes from sitting to standing. Today this happened and he was sitting in the car.  He had an episode last Wednesday, Sunday, and two episodes today. Per the patient's wife, she has been encouraging him to drink fluids. She checks his sugars when this happens and they are ok. No BP's reported during these episodes. She reports she helped him get out of the car today and he was "dead weight" for the most part. He was dragging his foot so hard behind him, that it pulled his shoe off.  I advised her I am uncertain if this is related to his orthostatic hypotension, of if he is having some type of neurologic event.  I advised I will forward to Dr. Rockey Situ for review and we will call her back with his recommendations.  She is agreeable.

## 2014-10-09 NOTE — Telephone Encounter (Signed)
Spoke w/ pt's wife.   Advised her of Dr. Donivan Scull recommendation.  She verbalizes understanding. Advised her of how to obtain orthostatic BP readings.  She will check these and call back w/ readings.

## 2014-10-15 ENCOUNTER — Other Ambulatory Visit: Payer: Self-pay | Admitting: Nurse Practitioner

## 2014-10-15 NOTE — Telephone Encounter (Signed)
Faxed received requesting refill for Plavix 75mg ,  See note below.  Please advise?

## 2014-10-15 NOTE — Telephone Encounter (Signed)
Spoke with pt today, advised him he needed to be seen by Lorane Gell, NP his new PCP in order to refill Lopressor Rx.  Pt states he is seeing Dr Howell Rucks and she needs to refill his medications.  I attempted to advise pt that Dr Howell Rucks is managing his DM not BP.  Pt further states he is not coming in to see anyone else for his refills.  Last OV 9.18.15.  Please advise

## 2014-10-16 ENCOUNTER — Telehealth: Payer: Self-pay | Admitting: Cardiovascular Disease

## 2014-10-16 NOTE — Telephone Encounter (Signed)
refill 

## 2014-10-16 NOTE — Telephone Encounter (Signed)
Thanks. Agree with plan 

## 2014-10-16 NOTE — Telephone Encounter (Signed)
Spoke to the patient on the phone to discuss his concerns and his refills.  Explained to patient that he was a patient of [name], NP prior and that he was referred to Dr. Howell Rucks for diabetes mgmt.  He was under the impression that he was getting all of his care done by Dr. Howell Rucks as she is in our office.  I explained that she would follow things like his BP and labs, but that when it comes down to refilling medications and making changes he needs a PCP to do that.  He further discussed some financial hardships with me since his insurance changed and he has had to increase his out of pocket spending for his prescriptions and visits.  We discussed that Dr. Boyd Kerbs services would be similar to his cardiologist, which he sees at least every year.  He was going to try and call Dr. Rockey Situ to get his Plavix and metoprolol reordered. He did refuse to make an appointment with C.Doss to continue PCP care at this time, and understood that we would not be refilling the interfaced prescriptions.  I explained if he changed his mind we could get him in to see C.Doss at his earliest convinence.

## 2014-10-16 NOTE — Telephone Encounter (Signed)
°  1. Which medications need to be refilled? Metoprolol 25 mg twice daily  2. Which pharmacy is medication to be sent to? [facility]. Do they need a 30 day or 90 day supply? No preference  4. Would they like a call back once the medication has been sent to the pharmacy? Yes

## 2014-10-17 ENCOUNTER — Other Ambulatory Visit: Payer: Self-pay | Admitting: *Deleted

## 2014-10-17 MED ORDER — METOPROLOL TARTRATE 25 MG PO TABS: 12.5000 mg | ORAL_TABLET | Freq: Two times a day (BID) | ORAL | Status: DC

## 2014-10-17 NOTE — Telephone Encounter (Signed)
Spoke w/ pt's wife.  She reports that she still does not have any BP readings to report, as she has had not the chance to check orthostatics on pt.  Inquired if pt has spoken w/ any other MD regarding symptoms, but she reports that pt has only seen a endocrinologist.  She states that she told pt that she would meet pt at this appt, as she did not think that pt would fully disclose his sx. She states that she could not find her keys and believes that pt hid them.  Encouraged her to sched appt w/ pt's PCP, as specialists address one system while PCP can order more testing than we can.  She is agreeable to this and will sched pt to see PCP and see if she can accompany him to this appt.  Advised her to call back if she obtains orthostatic BPs.

## 2014-10-17 NOTE — Telephone Encounter (Signed)
Patients daughter Eustaquio Maize called Thursday for refill (previous note) and states that patient is continuing to fall atleast 3 times a week.  Daughter says this is not a bloodsugar issue and that we may need to check his carotids when he comes in for visit.  Also per daughter, patient will lie and say that he is fine and everything is ok.

## 2014-10-17 NOTE — Telephone Encounter (Signed)
Thanks. I will encourage him as always to get set up with PCP.

## 2014-10-17 NOTE — Telephone Encounter (Signed)
Rx sent to local pharmacy until pt is able to be seen.

## 2014-10-21 ENCOUNTER — Other Ambulatory Visit: Payer: Self-pay | Admitting: *Deleted

## 2014-10-21 ENCOUNTER — Telehealth: Payer: Self-pay

## 2014-10-21 NOTE — Telephone Encounter (Signed)
Declines, see refill note 10/16/14

## 2014-10-21 NOTE — Telephone Encounter (Signed)
Patient called the triage line and is requesting that Dr. Howell Rucks call him back.  I have sent the message to her.  Please advise?

## 2014-10-22 ENCOUNTER — Other Ambulatory Visit: Payer: Self-pay

## 2014-10-22 MED ORDER — CLOPIDOGREL BISULFATE 75 MG PO TABS: 75.0000 mg | ORAL_TABLET | Freq: Every day | ORAL | Status: DC

## 2014-10-22 NOTE — Telephone Encounter (Signed)
Pt is completely out

## 2014-10-22 NOTE — Telephone Encounter (Signed)
Called patient and scheduled a follow up appt with Lorane Gell, NP.

## 2014-10-22 NOTE — Telephone Encounter (Signed)
Thanks

## 2014-10-22 NOTE — Telephone Encounter (Signed)
He was requesting his plavix to be refilled. I explained that he really needs a PCP, and would be happy to set him up with Lorane Gell, who is taking over [facility] patients. I can't refill his plavix. He seems to understand why he needs to make an appt with PCP. Towards end of conversation, he told me that he has also contacted Dr Donivan Scull office for a refill. On EMR, it looks like the Rx was printed after Dr Donivan Scull approval. I have asked the patient to call their office, to see if Rx needs to be picked up from their office or whether it is going to be faxed over to pharmacy.  Pt agreeable to set up appt with [facility]. Please could you do so.

## 2014-11-03 ENCOUNTER — Encounter: Payer: Self-pay | Admitting: Nurse Practitioner

## 2014-11-03 ENCOUNTER — Ambulatory Visit (INDEPENDENT_AMBULATORY_CARE_PROVIDER_SITE_OTHER): Payer: PPO | Admitting: Nurse Practitioner

## 2014-11-03 VITALS — BP 128/70 | HR 73 | Temp 98.1°F | Resp 14 | Ht 68.0 in | Wt 191.0 lb

## 2014-11-03 DIAGNOSIS — R21 Rash and other nonspecific skin eruption: Secondary | ICD-10-CM | POA: Diagnosis not present

## 2014-11-03 DIAGNOSIS — IMO0002 Reserved for concepts with insufficient information to code with codable children: Secondary | ICD-10-CM

## 2014-11-03 DIAGNOSIS — I25119 Atherosclerotic heart disease of native coronary artery with unspecified angina pectoris: Secondary | ICD-10-CM

## 2014-11-03 DIAGNOSIS — E1065 Type 1 diabetes mellitus with hyperglycemia: Secondary | ICD-10-CM | POA: Diagnosis not present

## 2014-11-03 LAB — SEDIMENTATION RATE: Sed Rate: 25 mm/hr — ABNORMAL HIGH (ref 0–22)

## 2014-11-03 LAB — C-REACTIVE PROTEIN: CRP: 0.6 mg/dL (ref 0.5–20.0)

## 2014-11-03 MED ORDER — TORSEMIDE 20 MG PO TABS: 20.0000 mg | ORAL_TABLET | Freq: Every day | ORAL | Status: DC

## 2014-11-03 MED ORDER — CLOPIDOGREL BISULFATE 75 MG PO TABS: 75.0000 mg | ORAL_TABLET | Freq: Every day | ORAL | Status: DC

## 2014-11-03 NOTE — Assessment & Plan Note (Signed)
Will work up for Lupus, obtain ESR, RF, Crp, and ANA. Pt reports uncle had lupus. Will refer to Rheumatology if any positives and pt is okay with this.

## 2014-11-03 NOTE — Progress Notes (Signed)
Subjective:    Patient ID: Todd Clay, male    DOB: 03-Jun-1945, 69 y.o.   MRN: [id number]  HPI  Todd Clay is a 69 yo male with a CC of episodic weakness x 3 months. He is accompanied by his wife today.   1) Patient's wife describes his episodes as "almost falls in the hallway, 2-3 x a week". The pt denies it is this frequent.   2 weeks ago last episode. Episodes 12 in the last 3 months. Wife thought it was a seizure, but he is completely coherent. He looses control of hands and feet and they jerk for 3-15 min. Wife describes one episode as she had to drag him down the hallway onto the bed.   Neck hurts in the morning, right arm shooting pains, numbness and pain all fingers, 10/10 pain occasionally 20 min. ASA 81 mg. No other treatments or medications to date.   BS- 200 at the most, 2 lows 34 one morning   BP- not checked at home  Feels "drunk", room not spinning  Carotid dopplars- bilateral recently from cardiology office Right side of neck - trapezius muscles achy   Rash on face (butterfly distribution), fatigue, weakness  Review of Systems  Constitutional: Positive for fatigue. Negative for fever, chills and diaphoresis.  HENT: Negative for tinnitus and trouble swallowing.   Eyes: Negative for visual disturbance.  Respiratory: Negative for chest tightness, shortness of breath and wheezing.   Cardiovascular: Negative for chest pain, palpitations and leg swelling.  Gastrointestinal: Negative for vomiting, diarrhea and rectal pain.  Musculoskeletal: Positive for myalgias.  Skin: Positive for rash.  Neurological: Positive for weakness, light-headedness and numbness. Negative for dizziness, tremors, seizures, syncope, facial asymmetry, speech difficulty and headaches.  Psychiatric/Behavioral: The patient is not nervous/anxious.    Past Medical History  Diagnosis Date  . History of chickenpox   . Diabetes mellitus with complication   . CAD (coronary artery disease)   .  Retinal detachment     OD  . Osteomyelitis     Left 2nd and 3rd metatarsal  . Diabetic neuropathy   . PVD (peripheral vascular disease)   . HTN (hypertension)   . HLD (hyperlipidemia)   . Depression   . Proliferative diabetic retinopathy     OU  . MI (myocardial infarction)     x3    History   Social History  . Marital Status: Married    Spouse Name: N/A  . Number of Children: N/A  . Years of Education: N/A   Occupational History  . Not on file.   Social History Main Topics  . Smoking status: Never Smoker   . Smokeless tobacco: Not on file  . Alcohol Use: No  . Drug Use: No  . Sexual Activity: Not on file   Other Topics Concern  . Not on file   Social History Narrative    Past Surgical History  Procedure Laterality Date  . 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 Stents      x 5   . Retinal detachment surgery      Laser  . Vitreous retinal surgery  1985    Laser tx OU  . Coronary artery bypass graft  1998    x1 vessel @ [facility]  . Cardiac catheterization    . Coronary angioplasty      Patient has a total of 5 stents placed.    Family History  Problem Relation Age of Onset  . Kidney disease Mother     Kidney failure  . Heart disease Father     CAD  . Cancer Brother     Head & Neck Tumor  . Diabetes Maternal Aunt   . Diabetes Maternal Uncle   . Diabetes Maternal Grandmother   . Diabetes Maternal Grandfather     No Known Allergies  Current Outpatient Prescriptions on File Prior to Visit  Medication Sig Dispense Refill  . aspirin EC 81 MG tablet Take 81 mg by mouth 2 (two) times daily.    Marland Kitchen gabapentin (NEURONTIN) 300 MG capsule Take 3 capsules (900 mg total) by mouth 3 (three) times daily. 270 capsule 6  . glucagon (GLUCAGON EMERGENCY) 1 MG injection Inject 1 mg into the muscle once as needed (in case of severe hypoglycemia). 1 each 12  . glucose blood (ONE TOUCH ULTRA TEST) test strip Use as instructed 4 times daily, Dx  code: 250.02 120 each 12  . insulin glargine (LANTUS) 100 UNIT/ML injection Inject 0.18 mLs (18 Units total) into the skin daily. 10 mL 5  . Insulin Pen Needle (BD PEN NEEDLE NANO U/F) 32G X 4 MM MISC Use for insulin administration three times daily 100 each 11  . insulin regular (NOVOLIN R RELION) 100 units/mL injection Inject 0.06 mLs (6 Units total) into the skin 3 (three) times daily before meals. 10 mL 2  . Lancets (ONETOUCH ULTRASOFT) lancets Use as instructed four times daily, dx code 250.02 120 each 12  . metoprolol tartrate (LOPRESSOR) 25 MG tablet Take 0.5 tablets (12.5 mg total) by mouth 2 (two) times daily. 30 tablet 0  . potassium chloride SA (K-DUR,KLOR-CON) 20 MEQ tablet Take 1 tablet (20 mEq total) by mouth daily. Take Monday, Wednesday and friday 90 tablet 0  . simvastatin (ZOCOR) 80 MG tablet Take 1 tablet (80 mg total) by mouth daily at 6 PM. 90 tablet 0   No current facility-administered medications on file prior to visit.      Objective:   Physical Exam  Constitutional: He is oriented to person, place, and time. He appears well-developed and well-nourished. No distress.  BP 128/70 mmHg  Pulse 73  Temp(Src) 98.1 F (36.7 C)  Resp 14  Ht 5\' 8"  (1.727 m)  Wt 191 lb (86.637 kg)  BMI 29.05 kg/m2  SpO2 96%   HENT:  Head: Normocephalic and atraumatic.  Right Ear: External ear normal.  Left Ear: External ear normal.  Sounds congested  Eyes: EOM are normal. Pupils are equal, round, and reactive to light. Right eye exhibits no discharge. Left eye exhibits no discharge. No scleral icterus.  Neck: Normal range of motion. Neck supple. No thyromegaly present.  Cardiovascular: Normal rate, regular rhythm and normal heart sounds.  Exam reveals no gallop and no friction rub.   No murmur heard. Pulmonary/Chest: Effort normal and breath sounds normal. No respiratory distress. He has no wheezes. He has no rales. He exhibits no tenderness.  Musculoskeletal: Normal range of motion.  He exhibits no edema or tenderness.  Trapezius muscles tight bilaterally  Lymphadenopathy:    He has no cervical adenopathy.  Neurological: He is alert and oriented to person, place, and time.  Deltoid 5/5 Bilateral, Biceps 5/5 bilateral, Wrist extensors 5/5 bilateral, Triceps 5/5 bilateral, finger flexors and abductors 5/5 bilateral, grip 5/5 bilateraly no Hoffman's,  Straight leg raise negative bilaterally. DTR's lower extremities 2-  Iliopsoas 5/5 Bilateral, Tib anterior 5/5 bilateral, intact heel/toe/sequential walking, sensation intact  upper and lower extremities. Romberg negative     Skin: Skin is warm and dry. Rash noted. He is not diaphoretic. There is erythema.  Malar rash involving nose and cheeks.   Psychiatric: He has a normal mood and affect. His behavior is normal. Judgment and thought content normal.      Assessment & Plan:

## 2014-11-03 NOTE — Patient Instructions (Signed)
We will contact you with your test results

## 2014-11-03 NOTE — Assessment & Plan Note (Signed)
Patient to follow with Endocrinology. He reports he is taking his insulin correctly and is still having 2 lows. Pt's wife says she gives him syrup when he has lows. Discussed proper diet and exercise because pt says he has gained wt.   Wt Readings from Last 3 Encounters:  11/03/14 191 lb (86.637 kg)  09/24/14 188 lb 6.4 oz (85.458 kg)  07/31/14 184 lb 4 oz (83.575 kg)

## 2014-11-03 NOTE — Progress Notes (Signed)
Pre visit review using our clinic review tool, if applicable. No additional management support is needed unless otherwise documented below in the visit note. 

## 2014-11-03 NOTE — Assessment & Plan Note (Signed)
Gave pt prescription for Plavix and Demadex. Pt will fill when ready. Will follow with Cardiology.

## 2014-11-04 LAB — ANA: Anti Nuclear Antibody(ANA): NEGATIVE

## 2014-11-04 LAB — RHEUMATOID FACTOR

## 2014-11-24 ENCOUNTER — Other Ambulatory Visit: Payer: Self-pay

## 2014-11-24 MED ORDER — METOPROLOL TARTRATE 25 MG PO TABS: 12.5000 mg | ORAL_TABLET | Freq: Two times a day (BID) | ORAL | Status: DC

## 2014-11-24 NOTE — Telephone Encounter (Signed)
Refill sent for metoprolol.  

## 2014-11-26 ENCOUNTER — Ambulatory Visit (INDEPENDENT_AMBULATORY_CARE_PROVIDER_SITE_OTHER): Payer: PPO | Admitting: Cardiovascular Disease

## 2014-11-26 ENCOUNTER — Encounter: Payer: Self-pay | Admitting: Cardiovascular Disease

## 2014-11-26 VITALS — BP 120/70 | HR 70 | Ht 68.0 in | Wt 192.0 lb

## 2014-11-26 DIAGNOSIS — E1065 Type 1 diabetes mellitus with hyperglycemia: Secondary | ICD-10-CM

## 2014-11-26 DIAGNOSIS — I25119 Atherosclerotic heart disease of native coronary artery with unspecified angina pectoris: Secondary | ICD-10-CM

## 2014-11-26 DIAGNOSIS — Z951 Presence of aortocoronary bypass graft: Secondary | ICD-10-CM | POA: Diagnosis not present

## 2014-11-26 DIAGNOSIS — I951 Orthostatic hypotension: Secondary | ICD-10-CM | POA: Diagnosis not present

## 2014-11-26 DIAGNOSIS — E785 Hyperlipidemia, unspecified: Secondary | ICD-10-CM

## 2014-11-26 DIAGNOSIS — IMO0002 Reserved for concepts with insufficient information to code with codable children: Secondary | ICD-10-CM

## 2014-11-26 NOTE — Assessment & Plan Note (Signed)
Currently with no symptoms of angina

## 2014-11-26 NOTE — Assessment & Plan Note (Signed)
Recommended that he continue on his simvastatin. Goal LDL less than 70  

## 2014-11-26 NOTE — Assessment & Plan Note (Signed)
Blood pressure is low today, unable to measure on the left, 74B systolic on the right Recommended he hold torsemide, hold the metoprolol This could perhaps explain his symptoms. If he continues to have shaking episodes, recommended he call our office Would consider repeat blood pressure checks, possibly even neurology evaluation

## 2014-11-26 NOTE — Assessment & Plan Note (Signed)
We have encouraged continued exercise, careful diet management in an effort to lose weight. Needs follow-up with endocrinology

## 2014-11-26 NOTE — Progress Notes (Signed)
Patient ID: Todd Clay, male    DOB: December 20, 1945, 69 y.o.   MRN: [id number]  HPI Comments:  Todd Clay is a pleasant 69 year old gentleman with history of CAD, bypass surgery December 69 with LIMA to the LAD at [facility], several catheterizations since that time with stents placed, stent to the left circumflex and RCA in 2005, history of poorly controlled diabetes, with PAD, recent toe indications (staph) for ostiomyelitis requiring PICC line and long-term antibiotics, who presents for routine followup of his coronary artery disease Prior history of orthostasis  He reports having seizure type activity at least once per week with shaking,. After the event with weakness for 10-15 minutes, healing sleepy Reports usually has symptoms when sitting or standing Wife reports an episode where he lost control of his bowels when laying in bed  when wife has witnessed these episodes, he will shake , other times he will stop moving, " freeze",  Unable to ambulate. He requires assistance to sit down or lay down from a standing position Denies any chest pain, lightheadedness, near syncope or syncope Denies any palpitations. This has been going on for several weeks to months No lower extremity edema. He continues on torsemide 3 days per week. Symptoms may have slightly improved by cutting back on his metoprolol down to 12.5 mg twice a day. Hemoglobin A1c down to 8, from 10  EKG on today's visit shows normal sinus rhythm with rate 70 bpm, PVCs  Other past medical history Cardiac catheterization in 2008 at [facility] showed 99% proximal RCA disease with endeavor DES placed 2.5 x 15 mm, also with 60% distal RCA disease, 100% OM1 disease followed by 50%, 80% proximal LAD disease, 100% followed by 90% mid LAD disease, 100% diagonal disease Normal ejection fraction at that time estimated greater than 60%  Prior cardiac catheterization in July 2005 details 60% proximal RCA followed by 50%, 90% and 70% distal  RCA disease 90% mid left circumflex disease, 90% OM 3 disease, 70% proximal LAD disease extending to the mid vessel, 100% mid LAD disease, 70% distal LAD disease  Cardiac catheterization July 2000 with 70% proximal RCA disease, 90% mid RCA disease, 70% distal RCA disease, 70% proximal left circumflex disease, 70% OM1 disease, 100% OM 2 disease, 70% proximal LAD to mid LAD disease, 70% mid and distal LAD disease, 100% diagonal disease   lab work in the hospital 07/26/2013 shows creatinine 1.43, BUN 21, potassium 4.1 Total cholesterol 130, hemoglobin A1c of 10  No Known Allergies  Current Outpatient Prescriptions on File Prior to Visit  Medication Sig Dispense Refill  . clopidogrel (PLAVIX) 75 MG tablet Take 1 tablet (75 mg total) by mouth daily with breakfast. 30 tablet 3  . gabapentin (NEURONTIN) 300 MG capsule Take 3 capsules (900 mg total) by mouth 3 (three) times daily. 270 capsule 6  . glucagon (GLUCAGON EMERGENCY) 1 MG injection Inject 1 mg into the muscle once as needed (in case of severe hypoglycemia). 1 each 12  . glucose blood (ONE TOUCH ULTRA TEST) test strip Use as instructed 4 times daily, Dx code: 250.02 120 each 12  . insulin glargine (LANTUS) 100 UNIT/ML injection Inject 0.18 mLs (18 Units total) into the skin daily. 10 mL 5  . Insulin Pen Needle (BD PEN NEEDLE NANO U/F) 32G X 4 MM MISC Use for insulin administration three times daily 100 each 11  . insulin regular (NOVOLIN R RELION) 100 units/mL injection Inject 0.06 mLs (6 Units total) into the skin 3 (  three) times daily before meals. 10 mL 2  . Lancets (ONETOUCH ULTRASOFT) lancets Use as instructed four times daily, dx code 250.02 120 each 12  . metoprolol tartrate (LOPRESSOR) 25 MG tablet Take 0.5 tablets (12.5 mg total) by mouth 2 (two) times daily. 30 tablet 6  . potassium chloride SA (K-DUR,KLOR-CON) 20 MEQ tablet Take 1 tablet (20 mEq total) by mouth daily. Take Monday, Wednesday and friday 90 tablet 0  . simvastatin  (ZOCOR) 80 MG tablet Take 1 tablet (80 mg total) by mouth daily at 6 PM. (Patient taking differently: Take 80 mg by mouth daily. ) 90 tablet 0  . torsemide (DEMADEX) 20 MG tablet Take 1 tablet (20 mg total) by mouth daily. Take Monday, Wednesday and Friday 15 tablet 5   No current facility-administered medications on file prior to visit.    Past Medical History  Diagnosis Date  . History of chickenpox   . Diabetes mellitus with complication   . CAD (coronary artery disease)   . Retinal detachment     OD  . Osteomyelitis     Left 2nd and 3rd metatarsal  . Diabetic neuropathy   . PVD (peripheral vascular disease)   . HTN (hypertension)   . HLD (hyperlipidemia)   . Depression   . Proliferative diabetic retinopathy     OU  . MI (myocardial infarction)     x3    Past Surgical History  Procedure Laterality Date  . 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 Stents      x 5   . Retinal detachment surgery      Laser  . Vitreous retinal surgery  1985    Laser tx O[facility]  . Coronary artery bypass graft  1998    x1 vessel @ [facility]  . Cardiac catheterization    . Coronary angioplasty      Patient has a total of 5 stents placed.    Social History  reports that he has never smoked. He does not have any smokeless tobacco history on file. He reports that he does not drink alcohol or use illicit drugs.  Family History family history includes Cancer in his brother; Diabetes in his maternal aunt, maternal grandfather, maternal grandmother, and maternal uncle; Heart disease in his father; Kidney disease in his mother.   Review of Systems  Constitutional: Negative.   Respiratory: Negative.   Cardiovascular: Negative.   Gastrointestinal: Negative.   Musculoskeletal: Positive for gait problem.  Skin: Negative.   Neurological: Negative.   Hematological: Negative.   Psychiatric/Behavioral: Negative.   All other systems reviewed and are negative.   BP 120/70 mmHg   Pulse 70  Ht 5\' 8"  (1.727 m)  Wt 192 lb (87.091 kg)  BMI 29.20 kg/m2 Repeat blood pressure with systolic in the 82U on the right, unable to measure pressure on the left arm Physical Exam  Constitutional: He is oriented to person, place, and time. He appears well-developed and well-nourished.  HENT:  Head: Normocephalic.  Nose: Nose normal.  Mouth/Throat: Oropharynx is clear and moist.  Eyes: Conjunctivae are normal. Pupils are equal, round, and reactive to light.  Neck: Normal range of motion. Neck supple. No JVD present.  Cardiovascular: Normal rate, regular rhythm, S1 normal, S2 normal, normal heart sounds and intact distal pulses.  Exam reveals no gallop and no friction rub.   No murmur heard. Pulses:      Carotid pulses are 2+ on the right  side, and 2+ on the left side.      Radial pulses are 1+ on the right side, and 1+ on the left side.       Dorsalis pedis pulses are 0 on the right side, and 0 on the left side.       Posterior tibial pulses are 0 on the right side, and 0 on the left side.  Pulmonary/Chest: Effort normal and breath sounds normal. No respiratory distress. He has no wheezes. He has no rales. He exhibits no tenderness.  Abdominal: Soft. Bowel sounds are normal. He exhibits no distension. There is no tenderness.  Musculoskeletal: Normal range of motion. He exhibits no edema or tenderness.  Lymphadenopathy:    He has no cervical adenopathy.  Neurological: He is alert and oriented to person, place, and time. Coordination normal.  Skin: Skin is warm and dry. No rash noted. No erythema.  Psychiatric: He has a normal mood and affect. His behavior is normal. Judgment and thought content normal.      Assessment and Plan   Nursing note and vitals reviewed.

## 2014-11-26 NOTE — Assessment & Plan Note (Signed)
Currently with no symptoms of angina. No further workup at this time. Continue current medication regimen. 

## 2014-11-26 NOTE — Patient Instructions (Signed)
Take torsemide only for significant leg and ankle swelling (edema) Hold the metoprolol Please call if you continue to have episodes  Please call us if you have new issues that need to be addressed before your next appt.  Your physician wants you to follow-up in: 6 month.

## 2014-12-23 ENCOUNTER — Other Ambulatory Visit: Payer: Self-pay

## 2014-12-23 MED ORDER — GABAPENTIN 300 MG PO CAPS: 900.0000 mg | ORAL_CAPSULE | Freq: Three times a day (TID) | ORAL | Status: DC

## 2014-12-24 ENCOUNTER — Ambulatory Visit: Payer: PPO | Admitting: Endocrinology

## 2015-01-19 ENCOUNTER — Other Ambulatory Visit: Payer: Self-pay | Admitting: Nurse Practitioner

## 2015-01-19 DIAGNOSIS — R569 Unspecified convulsions: Secondary | ICD-10-CM

## 2015-01-28 ENCOUNTER — Encounter: Payer: Self-pay | Admitting: Neurology

## 2015-01-28 ENCOUNTER — Ambulatory Visit (INDEPENDENT_AMBULATORY_CARE_PROVIDER_SITE_OTHER): Payer: PPO | Admitting: Neurology

## 2015-01-28 VITALS — BP 112/66 | HR 80 | Temp 98.4°F | Wt 196.6 lb

## 2015-01-28 DIAGNOSIS — R569 Unspecified convulsions: Secondary | ICD-10-CM | POA: Diagnosis not present

## 2015-01-28 DIAGNOSIS — R258 Other abnormal involuntary movements: Secondary | ICD-10-CM | POA: Diagnosis not present

## 2015-01-28 DIAGNOSIS — R252 Cramp and spasm: Secondary | ICD-10-CM

## 2015-01-28 DIAGNOSIS — R531 Weakness: Secondary | ICD-10-CM | POA: Diagnosis not present

## 2015-01-28 NOTE — Patient Instructions (Signed)
Remember to drink plenty of fluid, eat healthy meals and do not skip any meals. Try to eat protein with a every meal and eat a healthy snack such as fruit or nuts in between meals. Try to keep a regular sleep-wake schedule and try to exercise daily, particularly in the form of walking, 20-30 minutes a day, if you can.   As far as diagnostic testing: MRI of the brain, eeg  I would like to see you back after results, sooner if we need to. Please call us with any interim questions, concerns, problems, updates or refill requests.   Please also call us for any test results so we can go over those with you on the phone.  My clinical assistant and will answer any of your questions and relay your messages to me and also relay most of my messages to you.   Our phone number is [phone]. We also have an after hours call service for urgent matters and there is a physician on-call for urgent questions. For any emergencies you know to call 911 or go to the nearest emergency room

## 2015-01-28 NOTE — Progress Notes (Signed)
[facility]    Provider:  Dr Jaynee Eagles Referring Provider: Rubbie Battiest, NP Primary Care Physician:  Rubbie Battiest, NP  CC:  Seizure-like activity  HPI:  Todd Clay is a 69 y.o. male here as a referral from Dr. 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 Events started a few months ago. He can be sitting or standing and his legs feel weak, his arms jerk and his head tilts. He knows when it is happening, no loss of awareness. He had driven to a restaurant and all of a sudden in the parking lot it started happening and he felt weak, legs wouldn't work, his shoe came off in the parking lot because they were dragging him. Solid dead weight when it happens. He has had diabetes for 60 years. [place] that his blood sugar was low, he drank something sweet but then it still happened, he looked drunk. He jerks and stumbles like he is drunk. It is happening randomly, can be nothing for 2 weeks then every day. Lasts 10 minutes. No loss of awareness at all. When it is over he is not exhausted or tired or needs to sleep, he gets better then he can continue - in fact they went down to the beach after [place]. Since this happened, his face is swelling, his right eye looks strange. In between episodes everything is fine. He sleeps a lot. He snores. He was evaluated for OSA and was borderline. No CP, no SOB or palpitations during episodes. Wife says it is not really rhythmic jerking but more arm flailing because he is losing his balance, husband says it is rhythmic jerking with head jerking.   Reviewed notes, labs and imaging  from outside physicians, which showed:  ANA negative, ESR unremarkable, CRP normal, RF within normal limits, TSH within normal limits, hemoglobin A1c 8.3,  Carotid Doppler study showed 1-39% right ICA stenosis, 40-59% left ICA stenosis, patent vertebral arteries with antegrade flow. Normal subclavian arteries, bilaterally. Completed June 2015.  A review of records show that patient has type 1 diabetes which has been uncontrolled, neuropathy status post toe amputations, microvascular complications of coronary artery disease status post CABG, history of retinal detachment of the right eye, diagnosed with diabetes at age 81. Back in April notes state that he had episodes of syncope after hypoglycemia. He sees a cardiologist. Telephone calls from May state wife says patient is "about to pass out all the time" lasting for a few minutes states he almost falls. Telephone encounter in May 2016 were wife states that patient has intermittent episodes of his legs giving out and feeling rubbery, unable to move without assistance, sometimes will occur if he goes from sitting to standing, although on 10/08/2014 it happened when he was sitting in the car, 3 episodes in the last week. Wife checks his glucose during the episodes and they're fine. No blood pressures reported during these episodes. Later in May around the 20th the patient's daughter called patient's position and stated the patient was continuing to follow at least 3 times a week but will feel better when he lays down. He was seen by [facility] primary care in June for his episodic weakness for 3 months.  At that time was described as episodes is almost falling in the hallway 2-3 times a week for patient denying it was not frequent. The last episode at this time which was 11/03/2014 was 2 weeks ago with 12 in the last 3 months. Wife question whether it was a seizure, he loses control of hands and feet and the jerk for 3-15 minutes, with describes one episode if she had to  drag him down the hallway onto the bed. He had been asked to check blood pressure multiple times during these episodes and had not.  Cardiac catheterization in 2008 at [facility] showed 99% proximal RCA disease with endeavor DES placed 2.5 x 15 mm, also with 60% distal RCA disease, 100% OM1 disease followed by 50%, 80% proximal LAD disease, 100% followed by 90% mid LAD disease, 100% diagonal disease Normal ejection fraction at that time estimated greater than 60%  Prior cardiac catheterization in July 2005 details 60% proximal RCA followed by 50%, 90% and 70% distal RCA disease 90% mid left circumflex disease, 90% OM 3 disease, 70% proximal LAD disease extending to the mid vessel, 100% mid LAD disease, 70% distal LAD disease  Cardiac catheterization July 2000 with 70% proximal RCA disease, 90% mid RCA disease, 70% distal RCA disease, 70% proximal left circumflex disease, 70% OM1 disease, 100% OM 2 disease, 70% proximal LAD to mid LAD disease, 70% mid and distal LAD disease, 100% diagonal disease    Review of Systems: Patient complains of symptoms per HPI as well as the following symptoms: Easy bleeding, snoring, memory loss, confusion, numbness, weakness, dizziness, tremor, restless leg, too much sleep. Pertinent negatives per HPI. All others negative.   Social History   Social History  . Marital Status: Married    Spouse Name: N/A  . Number of Children: N/A  . Years of Education: N/A   Occupational History  . Not on file.   Social History Main Topics  . Smoking status: Never Smoker   . Smokeless tobacco: Not on file  . Alcohol Use: No  . Drug Use: No  . Sexual Activity: Not on file   Other Topics Concern  . Not on file   Social History Narrative    Family History  Problem Relation Age of Onset  . Kidney disease Mother     Kidney failure  . Heart disease Father     CAD  . Cancer Brother     Head & Neck Tumor  . Diabetes Maternal Aunt   . Diabetes Maternal Uncle   . Diabetes  Maternal Grandmother   . Diabetes Maternal Grandfather     Past Medical History  Diagnosis Date  . History of chickenpox   . Diabetes mellitus with complication   . CAD (coronary artery disease)   . Retinal detachment     OD  . Osteomyelitis     Left 2nd and 3rd metatarsal  . Diabetic neuropathy   . PVD (peripheral vascular disease)   . HTN (hypertension)   . HLD (hyperlipidemia)   . Depression   . Proliferative diabetic retinopathy     OU  . MI (myocardial infarction)     x3    Past Surgical History  Procedure Laterality Date  . 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 Stents      x 5   . Retinal detachment surgery      Laser  . Vitreous retinal surgery  1985  Laser tx [facility]  . Coronary artery bypass graft  1998    x1 vessel @ [facility]  . Cardiac catheterization    . Coronary angioplasty      Patient has a total of 5 stents placed.    Current Outpatient Prescriptions  Medication Sig Dispense Refill  . clopidogrel (PLAVIX) 75 MG tablet Take 1 tablet (75 mg total) by mouth daily with breakfast. 30 tablet 3  . gabapentin (NEURONTIN) 300 MG capsule Take 3 capsules (900 mg total) by mouth 3 (three) times daily. 270 capsule 6  . glucagon (GLUCAGON EMERGENCY) 1 MG injection Inject 1 mg into the muscle once as needed (in case of severe hypoglycemia). 1 each 12  . glucose blood (ONE TOUCH ULTRA TEST) test strip Use as instructed 4 times daily, Dx code: 250.02 120 each 12  . insulin glargine (LANTUS) 100 UNIT/ML injection Inject 0.18 mLs (18 Units total) into the skin daily. 10 mL 5  . Insulin Pen Needle (BD PEN NEEDLE NANO U/F) 32G X 4 MM MISC Use for insulin administration three times daily 100 each 11  . insulin regular (NOVOLIN R RELION) 100 units/mL injection Inject 0.06 mLs (6 Units total) into the skin 3 (three) times daily before meals. 10 mL 2  . Lancets (ONETOUCH ULTRASOFT) lancets Use as instructed four times daily, dx code 250.02 120 each 12    . potassium chloride SA (K-DUR,KLOR-CON) 20 MEQ tablet Take 1 tablet (20 mEq total) by mouth daily. Take Monday, Wednesday and friday 90 tablet 0  . simvastatin (ZOCOR) 80 MG tablet Take 1 tablet (80 mg total) by mouth daily at 6 PM. (Patient taking differently: Take 80 mg by mouth daily. ) 90 tablet 0  . metoprolol tartrate (LOPRESSOR) 25 MG tablet Take 0.5 tablets (12.5 mg total) by mouth 2 (two) times daily. (Patient not taking: Reported on 01/28/2015) 30 tablet 6  . torsemide (DEMADEX) 20 MG tablet Take 1 tablet (20 mg total) by mouth daily. Take Monday, Wednesday and Friday (Patient not taking: Reported on 01/28/2015) 15 tablet 5   No current facility-administered medications for this visit.    Allergies as of 01/28/2015  . (No Known Allergies)    Vitals: BP 112/66 mmHg  Pulse 80  Temp(Src) 98.4 F (36.9 C) (Oral)  Wt 196 lb 9.6 oz (89.177 kg) Last Weight:  Wt Readings from Last 1 Encounters:  01/28/15 196 lb 9.6 oz (89.177 kg)   Last Height:   Ht Readings from Last 1 Encounters:  11/26/14 5' 8"  (1.727 m)    Physical exam: Exam: Gen: NAD, conversant, well nourised, obese, well groomed                     CV: RRR, no MRG. No Carotid Bruits. No peripheral edema, warm, nontender Eyes: Conjunctivae clear without exudates or hemorrhage  Neuro: Detailed Neurologic Exam  Speech:    Speech is normal; fluent and spontaneous with normal comprehension.  Cognition:    The patient is oriented to person, place, and time;     recent and remote memory intact;     language fluent;     normal attention, concentration,     fund of knowledge Cranial Nerves:    The pupils are equal, round, and reactive to light. Cannot visualize fundi due to small pupils. Vleft superior quandrantanopia, can see light and shadows in the right eye.  Bilateral ptosis (chronic), Extraocular movements are intact. Trigeminal sensation is intact and the muscles of mastication are  normal. The face is symmetric.  The palate elevates in the midline. Hearing intact. Voice is normal. Shoulder shrug is normal. The tongue has normal motion without fasciculations.   Coordination:    Normal finger to nose and heel to shin. Normal rapid alternating movements.   Gait:    Heel-toe and tandem gait are normal.   Motor Observation:    No asymmetry, no atrophy, and no involuntary movements noted. Tone:    Normal muscle tone.    Posture:    Posture is normal. normal erect    Strength: mild weakness right triceps otherwise     Strength is V/V in the upper and lower limbs.      Sensation: intact to LT     Reflex Exam:  DTR's: 1+ uppers, absent lowers     Toes:    The toes are equivbilaterally.   Clonus:    Clonus is absent.      Assessment/Plan:  69 y.o. male here as a referral from Dr. 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 In May of this year he has started having episodes of extreme weakness, arm and head jerking without loss of consciousness or alteration of awareness, lasting 3-15 minutes. Patient has stable glucose at this time but they have not taken his blood pressure. Wife is concerning for seizure-like activity. We'll workup for seizures. I do recommend taking blood pressure during these episodes. We'll order EEG and MRI of the brain. If both of these are remarkable we'll order a three-day EEG ambulatory at home and try to catch an episode. Patient will follow up with me in the office after these results.  Sarina Ill, MD  [facility] [address] [place], [place] [address]  Phone [phone] Fax [phone]

## 2015-01-29 LAB — BASIC METABOLIC PANEL
BUN / CREAT RATIO: 19 (ref 10–22)
BUN: 17 mg/dL (ref 8–27)
CHLORIDE: 100 mmol/L (ref 97–108)
CO2: 25 mmol/L (ref 18–29)
Calcium: 8.6 mg/dL (ref 8.6–10.2)
Creatinine, Ser: 0.9 mg/dL (ref 0.76–1.27)
GFR calc non Af Amer: 87 mL/min/{1.73_m2} (ref >59)
GFR, EST AFRICAN AMERICAN: 101 mL/min/{1.73_m2} (ref >59)
GLUCOSE: 131 mg/dL — AB (ref 65–99)
POTASSIUM: 4.6 mmol/L (ref 3.5–5.2)
SODIUM: 136 mmol/L (ref 134–144)

## 2015-02-03 ENCOUNTER — Telehealth: Payer: Self-pay

## 2015-02-03 ENCOUNTER — Other Ambulatory Visit: Payer: Self-pay | Admitting: Surgical

## 2015-02-03 MED ORDER — POTASSIUM CHLORIDE CRYS ER 20 MEQ PO TBCR: 20.0000 meq | EXTENDED_RELEASE_TABLET | Freq: Every day | ORAL | Status: DC

## 2015-02-03 NOTE — Telephone Encounter (Signed)
Informed patient her BMP was normal.  Patient verbalized understanding

## 2015-02-20 ENCOUNTER — Ambulatory Visit
Admission: RE | Admit: 2015-02-20 | Discharge: 2015-02-20 | Disposition: A | Discharge status: Home / Self Care | Payer: PPO | Source: Ambulatory Visit | Attending: Neurology | Admitting: Neurology

## 2015-02-20 ENCOUNTER — Ambulatory Visit (INDEPENDENT_AMBULATORY_CARE_PROVIDER_SITE_OTHER): Payer: PPO | Admitting: Neurology

## 2015-02-20 DIAGNOSIS — R569 Unspecified convulsions: Secondary | ICD-10-CM

## 2015-02-20 DIAGNOSIS — R252 Cramp and spasm: Secondary | ICD-10-CM

## 2015-02-20 DIAGNOSIS — R531 Weakness: Secondary | ICD-10-CM | POA: Diagnosis not present

## 2015-02-20 MED ORDER — GADOBENATE DIMEGLUMINE 529 MG/ML IV SOLN
20.0000 mL | Freq: Once | INTRAVENOUS | Status: AC | PRN
  Administered 2015-02-20: 20 mL via INTRAVENOUS

## 2015-02-20 NOTE — Procedures (Signed)
    History:  Todd Clay is a 69 year old patient with a history of episodes of weakness of the legs, difficulty walking, unassociated with altered mental status or altered consciousness. The patient is being evaluated for these events.  This is a routine EEG. No skull defects are noted. Medications include Plavix, gabapentin, insulin, metoprolol, potassium supplementation, Zocor, and Demadex.   EEG classification: Normal awake  Description of the recording: The background rhythms of this recording consists of a fairly well modulated medium amplitude alpha rhythm of 8 Hz that is reactive to eye opening and closure. As the record progresses, the patient appears to remain in the waking state throughout the recording. Photic stimulation was performed, resulting in a bilateral and symmetric photic driving response. Hyperventilation was also performed, resulting in a minimal buildup of the background rhythm activities without significant slowing seen. At no time during the recording does there appear to be evidence of spike or spike wave discharges or evidence of focal slowing. EKG monitor shows no evidence of cardiac rhythm abnormalities with a heart rate of 78.  Impression: This is a normal EEG recording in the waking state. No evidence of ictal or interictal discharges are seen.

## 2015-02-22 ENCOUNTER — Other Ambulatory Visit: Payer: Self-pay | Admitting: Cardiovascular Disease

## 2015-02-23 ENCOUNTER — Telehealth: Payer: Self-pay | Admitting: Neurology

## 2015-02-23 NOTE — Telephone Encounter (Signed)
Spoke w/ wife about normal EEG. MRI unremarkable for his age. Showed some non-specific white matter changes. He needs to watch his vascular risk factors such as cholesterol, blood pressure, blood sugar per Dr. Jaynee Eagles. PCP can help manage these. MRI showed mild atrophy but this is typical for people of his age. No strokes or other intracranial abnormalities to explain his symptoms. She saw some chronic sinusitis and fluid behind the ears, which is likely chronic as well. He can f/u with PCP if he likes. She verbalized understanding.

## 2015-02-23 NOTE — Telephone Encounter (Signed)
Phone call from Cathera/Wife [phone], [phone], [phone] called regarding MRI, EEG results.

## 2015-03-19 ENCOUNTER — Other Ambulatory Visit: Payer: Self-pay | Admitting: Internal Medicine

## 2015-04-02 ENCOUNTER — Telehealth: Payer: Self-pay | Admitting: *Deleted

## 2015-04-03 ENCOUNTER — Encounter: Payer: Self-pay | Admitting: Nurse Practitioner

## 2015-04-03 ENCOUNTER — Ambulatory Visit (INDEPENDENT_AMBULATORY_CARE_PROVIDER_SITE_OTHER): Payer: PPO | Admitting: Nurse Practitioner

## 2015-04-03 VITALS — BP 126/74 | HR 74 | Temp 97.9°F | Wt 194.0 lb

## 2015-04-03 DIAGNOSIS — R21 Rash and other nonspecific skin eruption: Secondary | ICD-10-CM

## 2015-04-03 MED ORDER — METRONIDAZOLE 0.75 % EX GEL: 1.0000 "application " | Freq: Two times a day (BID) | CUTANEOUS | Status: DC

## 2015-04-03 NOTE — Patient Instructions (Signed)
Try it twice daily and avoid contact with eyes.   See dermatology if not helpful.

## 2015-04-03 NOTE — Assessment & Plan Note (Signed)
Work up for lupus negative. 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 Ketoconazole was not helpful so will treat with metrogel 0.75% bid and told to avoid the eyes. Follow up with dermatology if not improved.

## 2015-04-03 NOTE — Progress Notes (Signed)
Patient ID: Todd Clay, male    DOB: Jan 20, 1946  Age: 69 y.o. MRN: [id number]  CC: Rash   HPI Todd Clay presents for rash on face x several months (it was there at the last visit).  1) Worse at nighttime, burning sensation of face.   Patient denies worsening symptoms from the following:  ?Extremes of temperature ?Sunlight ?Spicy foods ?Alcohol  ?Exercise ?Acute psychological stressors  ?Medications   Tried Nizoral from wife and it was helpful, but not for long   History Todd Clay has a past medical history of History of chickenpox; Diabetes mellitus with complication ([place]); CAD (coronary artery disease); Retinal detachment; Osteomyelitis ([place]); Diabetic neuropathy ([place]); PVD (peripheral vascular disease) ([place]); HTN (hypertension); HLD (hyperlipidemia); Depression; Proliferative diabetic retinopathy ([place]); and MI (myocardial infarction) ([place]).   He has past surgical history that includes Amputation of replicated toes; STENTS; Retinal detachment surgery; VITREOUS RETINAL SURGERY (1985); Coronary artery bypass graft (1998); Cardiac catheterization; and Coronary angioplasty.   His family history includes Cancer in his brother; Diabetes in his maternal aunt, maternal grandfather, maternal grandmother, and maternal uncle; Heart disease in his father; Kidney disease in his mother.He reports that he has never smoked. He does not have any smokeless tobacco history on file. He reports that he does not drink alcohol or use illicit drugs.  Outpatient Prescriptions Prior to Visit  Medication Sig Dispense Refill  . clopidogrel (PLAVIX) 75 MG tablet Take 1 tablet (75 mg total) by mouth daily with breakfast. 30 tablet 3  . clopidogrel (PLAVIX) 75 MG tablet TAKE ONE TABLET BY MOUTH ONCE DAILY WITH BREAKFAST 30 tablet 1  . gabapentin (NEURONTIN) 300 MG capsule Take 3 capsules (900 mg total) by mouth 3 (three) times daily. 270 capsule 6  . glucagon (GLUCAGON EMERGENCY) 1 MG injection Inject 1  mg into the muscle once as needed (in case of severe hypoglycemia). 1 each 12  . glucose blood (ONE TOUCH ULTRA TEST) test strip Use as instructed 4 times daily, Dx code: 250.02 120 each 12  . insulin glargine (LANTUS) 100 UNIT/ML injection Inject 0.18 mLs (18 Units total) into the skin daily. 10 mL 5  . Insulin Pen Needle (BD PEN NEEDLE NANO U/F) 32G X 4 MM MISC Use for insulin administration three times daily 100 each 11  . insulin regular (NOVOLIN R RELION) 100 units/mL injection Inject 0.06 mLs (6 Units total) into the skin 3 (three) times daily before meals. 10 mL 2  . Lancets (ONETOUCH ULTRASOFT) lancets Use as instructed four times daily, dx code 250.02 120 each 12  . metoprolol tartrate (LOPRESSOR) 25 MG tablet Take 0.5 tablets (12.5 mg total) by mouth 2 (two) times daily. 30 tablet 6  . potassium chloride SA (K-DUR,KLOR-CON) 20 MEQ tablet Take 1 tablet (20 mEq total) by mouth daily. Take Monday, Wednesday and friday 90 tablet 0  . simvastatin (ZOCOR) 80 MG tablet Take 1 tablet (80 mg total) by mouth daily at 6 PM. (Patient taking differently: Take 80 mg by mouth daily. ) 90 tablet 0  . simvastatin (ZOCOR) 80 MG tablet TAKE ONE TABLET BY MOUTH ONCE DAILY AT  6PM 90 tablet 0  . torsemide (DEMADEX) 20 MG tablet Take 1 tablet (20 mg total) by mouth daily. Take Monday, Wednesday and Friday 15 tablet 5   No facility-administered medications prior to visit.    ROS Review of Systems  Constitutional: Negative for fever, chills, diaphoresis, appetite change and fatigue.  Eyes: Negative for visual disturbance.  Skin: Positive  for rash.    Objective:  BP 126/74 mmHg  Pulse 74  Temp(Src) 97.9 F (36.6 C) (Oral)  Wt 194 lb (87.998 kg)  SpO2 98%  Physical Exam  Constitutional: He is oriented to person, place, and time. He appears well-developed and well-nourished. No distress.  HENT:  Head: Normocephalic and atraumatic.    Right Ear: External ear normal.  Left Ear: External ear normal.   Papular, erythematous, telangectasia   Neurological: He is alert and oriented to person, place, and time.  Skin: Skin is warm and dry. Rash noted. He is not diaphoretic. There is erythema.  Psychiatric: He has a normal mood and affect. His behavior is normal. Judgment and thought content normal.   Assessment & Plan:   Todd Clay was seen today for rash.  Diagnoses and all orders for this visit:  Malar rash  Other orders -     metroNIDAZOLE (METROGEL) 0.75 % gel; Apply 1 application topically 2 (two) times daily.   I am having Todd Clay start on metroNIDAZOLE. I am also having him maintain his onetouch ultrasoft, glucagon, Insulin Pen Needle, simvastatin, glucose blood, insulin regular, insulin glargine, clopidogrel, torsemide, metoprolol tartrate, gabapentin, potassium chloride SA, clopidogrel, and simvastatin.  Meds ordered this encounter  Medications  . metroNIDAZOLE (METROGEL) 0.75 % gel    Sig: Apply 1 application topically 2 (two) times daily.    Dispense:  45 g    Refill:  0    Order Specific Question:  Supervising Provider    Answer:  Crecencio Mc [[id number]]     Follow-up: Return if symptoms worsen or fail to improve.

## 2015-04-17 ENCOUNTER — Other Ambulatory Visit: Payer: Self-pay | Admitting: Cardiovascular Disease

## 2015-05-05 ENCOUNTER — Ambulatory Visit: Payer: PPO | Admitting: Nurse Practitioner

## 2015-06-19 ENCOUNTER — Other Ambulatory Visit: Payer: Self-pay | Admitting: Nurse Practitioner

## 2015-06-25 ENCOUNTER — Other Ambulatory Visit: Payer: Self-pay

## 2015-06-25 MED ORDER — CLOPIDOGREL BISULFATE 75 MG PO TABS: ORAL_TABLET | ORAL | Status: DC

## 2015-06-25 NOTE — Telephone Encounter (Signed)
Pt states he has been out for 4 days.

## 2015-07-01 ENCOUNTER — Encounter: Payer: Self-pay | Admitting: Cardiovascular Disease

## 2015-07-01 ENCOUNTER — Ambulatory Visit (INDEPENDENT_AMBULATORY_CARE_PROVIDER_SITE_OTHER): Payer: PPO | Admitting: Cardiovascular Disease

## 2015-07-01 VITALS — BP 120/80 | HR 68 | Ht 68.0 in | Wt 201.2 lb

## 2015-07-01 DIAGNOSIS — E1059 Type 1 diabetes mellitus with other circulatory complications: Secondary | ICD-10-CM | POA: Diagnosis not present

## 2015-07-01 DIAGNOSIS — E785 Hyperlipidemia, unspecified: Secondary | ICD-10-CM

## 2015-07-01 DIAGNOSIS — I25119 Atherosclerotic heart disease of native coronary artery with unspecified angina pectoris: Secondary | ICD-10-CM | POA: Diagnosis not present

## 2015-07-01 DIAGNOSIS — E1065 Type 1 diabetes mellitus with hyperglycemia: Secondary | ICD-10-CM

## 2015-07-01 DIAGNOSIS — Z951 Presence of aortocoronary bypass graft: Secondary | ICD-10-CM | POA: Diagnosis not present

## 2015-07-01 DIAGNOSIS — I951 Orthostatic hypotension: Secondary | ICD-10-CM | POA: Diagnosis not present

## 2015-07-01 DIAGNOSIS — IMO0002 Reserved for concepts with insufficient information to code with codable children: Secondary | ICD-10-CM

## 2015-07-01 NOTE — Assessment & Plan Note (Signed)
He denies any symptoms concerning for angina No further testing at this time 

## 2015-07-01 NOTE — Progress Notes (Signed)
Patient ID: Todd Clay, male    DOB: 1946/03/13, 70 y.o.   MRN: [id number]  HPI Comments:  Todd Clay is a pleasant 70 year old gentleman with history of CAD, bypass surgery December 1997 with LIMA to the LAD at [facility], several catheterizations since that time with stents placed, stent to the left circumflex and RCA in 2005, history of poorly controlled diabetes, with PAD, recent toe indications (staph) for ostiomyelitis requiring PICC line and long-term antibiotics, who presents for routine followup of his coronary artery disease Prior history of orthostasis Carotid u/s 40 to 59%  On his last clinic visit he had systolic pressure in the 0000000, metoprolol was held, torsemide was held with dramatic improvement of his symptoms. He had shaking, freezing, required assistance at times. Since the medication changes, he reports he is had no further episodes.  He did have significant workup in [place] including MRI, EEG in September 2016 He reports his weight is higher, feels his sugars are likely going to run high, has not had hemoglobin A1c recently Weight is 201 pounds, typically weight is lower No food restrictions  Hemoglobin A1c down to 8, from 10  EKG on today's visit shows normal sinus rhythm with rate 69 bpm, no significant ST or T-wave changes  Other past medical history Cardiac catheterization in 2008 at [facility] showed 99% proximal RCA disease with endeavor DES placed 2.5 x 15 mm, also with 60% distal RCA disease, 100% OM1 disease followed by 50%, 80% proximal LAD disease, 100% followed by 90% mid LAD disease, 100% diagonal disease Normal ejection fraction at that time estimated greater than 60%  Prior cardiac catheterization in July 2005 details 60% proximal RCA followed by 50%, 90% and 70% distal RCA disease 90% mid left circumflex disease, 90% OM 3 disease, 70% proximal LAD disease extending to the mid vessel, 100% mid LAD disease, 70% distal LAD disease  Cardiac  catheterization July 2000 with 70% proximal RCA disease, 90% mid RCA disease, 70% distal RCA disease, 70% proximal left circumflex disease, 70% OM1 disease, 100% OM 2 disease, 70% proximal LAD to mid LAD disease, 70% mid and distal LAD disease, 100% diagonal disease   lab work in the hospital 07/26/2013 shows creatinine 1.43, BUN 21, potassium 4.1 Total cholesterol 130, hemoglobin A1c of 10  No Known Allergies  Current Outpatient Prescriptions on File Prior to Visit  Medication Sig Dispense Refill  . clopidogrel (PLAVIX) 75 MG tablet TAKE ONE TABLET BY MOUTH ONCE DAILY WITH BREAKFAST 30 tablet 1  . gabapentin (NEURONTIN) 300 MG capsule Take 3 capsules (900 mg total) by mouth 3 (three) times daily. 270 capsule 6  . glucagon (GLUCAGON EMERGENCY) 1 MG injection Inject 1 mg into the muscle once as needed (in case of severe hypoglycemia). 1 each 12  . glucose blood (ONE TOUCH ULTRA TEST) test strip Use as instructed 4 times daily, Dx code: 250.02 120 each 12  . insulin regular (NOVOLIN R RELION) 100 units/mL injection Inject 0.06 mLs (6 Units total) into the skin 3 (three) times daily before meals. 10 mL 2  . Lancets (ONETOUCH ULTRASOFT) lancets Use as instructed four times daily, dx code 250.02 120 each 12  . LANTUS 100 UNIT/ML injection INJECT 18 UNITS INTO THE SKIN DAILY 10 mL 5  . simvastatin (ZOCOR) 80 MG tablet TAKE ONE TABLET BY MOUTH ONCE DAILY AT 6PM 90 tablet 2   No current facility-administered medications on file prior to visit.    Past Medical History  Diagnosis Date  .  History of chickenpox   . Diabetes mellitus with complication ([place])   . CAD (coronary artery disease)   . Retinal detachment     OD  . Osteomyelitis ([place])     Left 2nd and 3rd metatarsal  . Diabetic neuropathy ([place])   . PVD (peripheral vascular disease) ([place])   . HTN (hypertension)   . HLD (hyperlipidemia)   . Depression   . Proliferative diabetic retinopathy ([place])     OU  . MI (myocardial infarction)  ([place])     x3    Past Surgical History  Procedure Laterality Date  . 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 Stents      x 5   . Retinal detachment surgery      Laser  . Vitreous retinal surgery  1985    Laser tx OU  . Coronary artery bypass graft  1998    x1 vessel @ [facility]  . Cardiac catheterization    . Coronary angioplasty      Patient has a total of 5 stents placed.    Social History  reports that he has never smoked. He does not have any smokeless tobacco history on file. He reports that he does not drink alcohol or use illicit drugs.  Family History family history includes Cancer in his brother; Diabetes in his maternal aunt, maternal grandfather, maternal grandmother, and maternal uncle; Heart disease in his father; Kidney disease in his mother.   Review of Systems  Constitutional: Negative.   Respiratory: Negative.   Cardiovascular: Negative.   Gastrointestinal: Negative.   Musculoskeletal: Negative.   Neurological: Negative.   Hematological: Negative.   Psychiatric/Behavioral: Negative.   All other systems reviewed and are negative.   BP 120/80 mmHg  Pulse 68  Ht 5\' 8"  (1.727 m)  Wt 201 lb 4 oz (91.286 kg)  BMI 30.61 kg/m2  Physical Exam  Constitutional: He is oriented to person, place, and time. He appears well-developed and well-nourished.  HENT:  Head: Normocephalic.  Nose: Nose normal.  Mouth/Throat: Oropharynx is clear and moist.  Eyes: Conjunctivae are normal. Pupils are equal, round, and reactive to light.  Neck: Normal range of motion. Neck supple. No JVD present.  Cardiovascular: Normal rate, regular rhythm, S1 normal, S2 normal, normal heart sounds and intact distal pulses.  Exam reveals no gallop and no friction rub.   No murmur heard. Pulses:      Carotid pulses are 2+ on the right side, and 2+ on the left side.      Radial pulses are 1+ on the right side, and 1+ on the left side.       Dorsalis pedis pulses are 0  on the right side, and 0 on the left side.       Posterior tibial pulses are 0 on the right side, and 0 on the left side.  Pulmonary/Chest: Effort normal and breath sounds normal. No respiratory distress. He has no wheezes. He has no rales. He exhibits no tenderness.  Abdominal: Soft. Bowel sounds are normal. He exhibits no distension. There is no tenderness.  Musculoskeletal: Normal range of motion. He exhibits no edema or tenderness.  Lymphadenopathy:    He has no cervical adenopathy.  Neurological: He is alert and oriented to person, place, and time. Coordination normal.  Skin: Skin is warm and dry. No rash noted. No erythema.  Psychiatric: He has a normal mood and affect. His behavior is normal. Judgment and  thought content normal.      Assessment and Plan   Nursing note and vitals reviewed.

## 2015-07-01 NOTE — Assessment & Plan Note (Signed)
Symptoms of orthostatic hypotension have resolved, Feels better without torsemide and metoprolol Recommended he stop his potassium

## 2015-07-01 NOTE — Patient Instructions (Addendum)
You are doing well.  Please stop the potassium (kclor con)  Keep working on the weight, this will help your sugars Avoid the breads, potato, desserts  Please call us if you have new issues that need to be addressed before your next appt.  Your physician wants you to follow-up in: 6 months.  You will receive a reminder letter in the mail two months in advance. If you don't receive a letter, please call our office to schedule the follow-up appointment.

## 2015-07-01 NOTE — Assessment & Plan Note (Signed)
We have encouraged continued exercise, careful diet management in an effort to lose weight. Eats close follow-up with primary care Dietary guide provided

## 2015-07-01 NOTE — Assessment & Plan Note (Signed)
Recommended he stay on his simvastatin. Goal LDL less than 70 No recent lab work available

## 2015-07-01 NOTE — Assessment & Plan Note (Signed)
Currently with no symptoms of angina. No further workup at this time. Continue current medication regimen. High risk of recurrent disease given his poorly controlled diabetes

## 2015-07-27 ENCOUNTER — Ambulatory Visit (INDEPENDENT_AMBULATORY_CARE_PROVIDER_SITE_OTHER): Payer: PPO | Admitting: Nurse Practitioner

## 2015-07-27 ENCOUNTER — Encounter: Payer: Self-pay | Admitting: Nurse Practitioner

## 2015-07-27 VITALS — BP 110/68 | HR 76 | Temp 97.9°F | Resp 14 | Ht 68.0 in | Wt 205.8 lb

## 2015-07-27 DIAGNOSIS — R21 Rash and other nonspecific skin eruption: Secondary | ICD-10-CM

## 2015-07-27 DIAGNOSIS — E1065 Type 1 diabetes mellitus with hyperglycemia: Secondary | ICD-10-CM

## 2015-07-27 DIAGNOSIS — Z89429 Acquired absence of other toe(s), unspecified side: Secondary | ICD-10-CM

## 2015-07-27 DIAGNOSIS — IMO0002 Reserved for concepts with insufficient information to code with codable children: Secondary | ICD-10-CM

## 2015-07-27 DIAGNOSIS — E1042 Type 1 diabetes mellitus with diabetic polyneuropathy: Secondary | ICD-10-CM

## 2015-07-27 DIAGNOSIS — D239 Other benign neoplasm of skin, unspecified: Secondary | ICD-10-CM | POA: Diagnosis not present

## 2015-07-27 DIAGNOSIS — E1059 Type 1 diabetes mellitus with other circulatory complications: Secondary | ICD-10-CM | POA: Diagnosis not present

## 2015-07-27 LAB — COMPREHENSIVE METABOLIC PANEL
ALBUMIN: 3.4 g/dL — AB (ref 3.5–5.2)
ALT: 12 U/L (ref 0–53)
AST: 19 U/L (ref 0–37)
Alkaline Phosphatase: 77 U/L (ref 39–117)
BUN: 15 mg/dL (ref 6–23)
CALCIUM: 8.3 mg/dL — AB (ref 8.4–10.5)
CHLORIDE: 102 meq/L (ref 96–112)
CO2: 28 mEq/L (ref 19–32)
Creatinine, Ser: 0.96 mg/dL (ref 0.40–1.50)
GFR: 82.47 mL/min (ref >60.00)
GLUCOSE: 312 mg/dL — AB (ref 70–99)
POTASSIUM: 4.4 meq/L (ref 3.5–5.1)
Sodium: 135 mEq/L (ref 135–145)
TOTAL PROTEIN: 5.6 g/dL — AB (ref 6.0–8.3)
Total Bilirubin: 0.3 mg/dL (ref 0.2–1.2)

## 2015-07-27 LAB — CBC WITH DIFFERENTIAL/PLATELET
BASOS PCT: 0.8 % (ref 0.0–3.0)
Basophils Absolute: 0.1 10*3/uL (ref 0.0–0.1)
EOS PCT: 6.3 % — AB (ref 0.0–5.0)
Eosinophils Absolute: 0.4 10*3/uL (ref 0.0–0.7)
HCT: 35.9 % — ABNORMAL LOW (ref 39.0–52.0)
HEMOGLOBIN: 12.2 g/dL — AB (ref 13.0–17.0)
LYMPHS ABS: 2.2 10*3/uL (ref 0.7–4.0)
Lymphocytes Relative: 32.3 % (ref 12.0–46.0)
MCHC: 34 g/dL (ref 30.0–36.0)
MCV: 88.7 fl (ref 78.0–100.0)
Monocytes Absolute: 0.4 10*3/uL (ref 0.1–1.0)
Monocytes Relative: 5.7 % (ref 3.0–12.0)
Neutro Abs: 3.8 10*3/uL (ref 1.4–7.7)
Neutrophils Relative %: 54.9 % (ref 43.0–77.0)
Platelets: 192 10*3/uL (ref 150.0–400.0)
RBC: 4.05 Mil/uL — ABNORMAL LOW (ref 4.22–5.81)
RDW: 13.7 % (ref 11.5–15.5)
WBC: 6.9 10*3/uL (ref 4.0–10.5)

## 2015-07-27 LAB — HEMOGLOBIN A1C: Hgb A1c MFr Bld: 8.8 % — ABNORMAL HIGH (ref 4.6–6.5)

## 2015-07-27 MED ORDER — BLOOD GLUCOSE MONITOR KIT: PACK | Status: DC

## 2015-07-27 NOTE — Patient Instructions (Addendum)
Please visit the lab before leaving today.   Set timers-Write notes to remind yourself Take your medicines as prescribed.   We will call you about your referral to dermatology  Lantus 18 units at bedtime   Novolin R 6 units before meals 3 x daily!!!   BRING BLOOD SUGARS (4 x daily) to next visit

## 2015-07-27 NOTE — Progress Notes (Signed)
Patient ID: Todd Clay, male    DOB: 1945-11-19  Age: 70 y.o. MRN: [id number]  CC: Follow-up and Diabetes   HPI Todd Clay presents for follow up of diabetes and is accompanied by his wife today.  1) 8.3 in April last year Eye exam- has not had Due for Urine Microalbumin, Foot exam and A1c  BS- hasn't been doing it   2 lows- subjective, wife gave him syrup and he improved  2) Feels he has memory deficits  Feels his short-term memory is worse recently Does not have any examples  3) Arms numb bilaterally from lower arm to fingers 1-2 years he reports Right arm more than left  Pt is dominant left handed   4) Nevi Patient reports he has increasing brown spots on his for head and would like these to be looked at by dermatologist  History Todd Clay has a past medical history of History of chickenpox; Diabetes mellitus with complication ([place]); CAD (coronary artery disease); Retinal detachment; Osteomyelitis ([place]); Diabetic neuropathy ([place]); PVD (peripheral vascular disease) ([place]); HTN (hypertension); HLD (hyperlipidemia); Depression; Proliferative diabetic retinopathy ([place]); and MI (myocardial infarction) ([place]).   He has past surgical history that includes Amputation of replicated toes; STENTS; Retinal detachment surgery; VITREOUS RETINAL SURGERY (1985); Coronary artery bypass graft (1998); Cardiac catheterization; and Coronary angioplasty.   His family history includes Cancer in his brother; Diabetes in his maternal aunt, maternal grandfather, maternal grandmother, and maternal uncle; Heart disease in his father; Kidney disease in his mother.He reports that he has never smoked. He does not have any smokeless tobacco history on file. He reports that he does not drink alcohol or use illicit drugs.  Outpatient Prescriptions Prior to Visit  Medication Sig Dispense Refill  . clopidogrel (PLAVIX) 75 MG tablet TAKE ONE TABLET BY MOUTH ONCE DAILY WITH BREAKFAST 30 tablet 1  . gabapentin  (NEURONTIN) 300 MG capsule Take 3 capsules (900 mg total) by mouth 3 (three) times daily. 270 capsule 6  . glucagon (GLUCAGON EMERGENCY) 1 MG injection Inject 1 mg into the muscle once as needed (in case of severe hypoglycemia). 1 each 12  . insulin regular (NOVOLIN R RELION) 100 units/mL injection Inject 0.06 mLs (6 Units total) into the skin 3 (three) times daily before meals. 10 mL 2  . LANTUS 100 UNIT/ML injection INJECT 18 UNITS INTO THE SKIN DAILY 10 mL 5  . simvastatin (ZOCOR) 80 MG tablet TAKE ONE TABLET BY MOUTH ONCE DAILY AT 6PM 90 tablet 2  . glucose blood (ONE TOUCH ULTRA TEST) test strip Use as instructed 4 times daily, Dx code: 250.02 120 each 12  . Lancets (ONETOUCH ULTRASOFT) lancets Use as instructed four times daily, dx code 250.02 120 each 12   No facility-administered medications prior to visit.    ROS Review of Systems  Constitutional: Negative for fever, chills, diaphoresis and fatigue.  HENT: Negative for congestion, ear discharge, ear pain, postnasal drip, rhinorrhea, sinus pressure, sneezing, sore throat, tinnitus, trouble swallowing and voice change.   Eyes: Negative for visual disturbance.  Respiratory: Positive for cough. Negative for chest tightness, shortness of breath and wheezing.   Cardiovascular: Negative for chest pain, palpitations and leg swelling.  Gastrointestinal: Negative for nausea, vomiting, diarrhea and rectal pain.  Skin: Positive for color change. Negative for rash.  Neurological: Positive for numbness. Negative for dizziness and weakness.  Psychiatric/Behavioral: Positive for confusion. The patient is not nervous/anxious.     Objective:  BP 110/68 mmHg  Pulse 76  Temp(Src)  97.9 F (36.6 C) (Oral)  Resp 14  Ht 5' 8" (1.727 m)  Wt 205 lb 12.8 oz (93.35 kg)  BMI 31.30 kg/m2  SpO2 97%  Physical Exam  Constitutional: He is oriented to person, place, and time. He appears well-developed and well-nourished. No distress.  HENT:  Head:  Normocephalic and atraumatic.  Right Ear: External ear normal.  Left Ear: External ear normal.  Cardiovascular: Normal rate, regular rhythm and normal heart sounds.  Exam reveals no gallop and no friction rub.   No murmur heard. Pulmonary/Chest: Effort normal and breath sounds normal. No respiratory distress. He has no wheezes. He has no rales. He exhibits no tenderness.  Neurological: He is alert and oriented to person, place, and time.  Grip strength is equal bilaterally  Skin: Skin is warm and dry. No rash noted. He is not diaphoretic.  Erythema around nose  Several spots that appear to be SK around hair line and on face Shiny skin around fingertips  Psychiatric: He has a normal mood and affect. His behavior is normal. Judgment and thought content normal.   Assessment & Plan:   Todd Clay was seen today for follow-up and diabetes.  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 Use up to four times daily as directed. (FOR ICD-9 250.00, 250.01). -     Hemoglobin A1c -     Cancel: Urine Microalbumin w/creat. 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 Use up to four times daily as directed. (FOR ICD-9 250.00, 250.01). -     Hemoglobin A1c -     Cancel: Urine Microalbumin w/creat. ratio -     Comprehensive metabolic panel -     CBC with Differential/Platelet  Dysplastic nevi -     Ambulatory referral to Dermatology  Toe amputation status, unspecified laterality (HCC)  Malar rash   I have discontinued Todd Clay onetouch ultrasoft and glucose blood. I am also having him start on blood glucose meter kit  and supplies. Additionally, I am having him maintain his glucagon, insulin regular, gabapentin, simvastatin, LANTUS, clopidogrel, and KLOR-CON M20.  Meds ordered this encounter  Medications  . KLOR-CON M20 20 MEQ tablet    Sig:   . blood glucose meter kit and supplies KIT    Sig: Dispense based on patient and insurance preference. Use up to four times daily as directed. (FOR ICD-9 250.00, 250.01).    Dispense:  1 each    Refill:  0    Product selection permitted per insurance    Order Specific Question:  Supervising Provider    Answer:  Crecencio Mc [[id number]]    Order Specific Question:  Number of strips    Answer:  100    Order Specific Question:  Number of lancets    Answer:  100     Follow-up: Return in about 4 weeks (around 08/24/2015) for Follow up .

## 2015-07-30 DIAGNOSIS — D239 Other benign neoplasm of skin, unspecified: Secondary | ICD-10-CM | POA: Insufficient documentation

## 2015-07-30 NOTE — Assessment & Plan Note (Signed)
Foot exam performed today no wounds visible Pulses intact bilaterally

## 2015-07-30 NOTE — Assessment & Plan Note (Signed)
Highly encouraged eye exam Foot exam performed today without significant findings We'll obtain A1c, C met, CBC with differential Patient is unable to urinate today for urinary microalbumin We'll obtain at next visit Return in 4 weeks for follow-up

## 2015-07-30 NOTE — Assessment & Plan Note (Signed)
Patient has multiple areas around hairline and on scalp that look to be seborrheic keratosis. Patient would like to have full body check for multiple moles that are suspicious.

## 2015-07-30 NOTE — Assessment & Plan Note (Signed)
Going to be checked out by dermatology Improving

## 2015-07-30 NOTE — Assessment & Plan Note (Addendum)
Patient has worsening control of his diabetes Grip strength is equal bilaterally  We'll obtain labs today

## 2015-07-31 ENCOUNTER — Encounter: Payer: Self-pay | Admitting: Nurse Practitioner

## 2015-08-10 ENCOUNTER — Encounter: Payer: Self-pay | Admitting: Nurse Practitioner

## 2015-08-10 ENCOUNTER — Ambulatory Visit (INDEPENDENT_AMBULATORY_CARE_PROVIDER_SITE_OTHER): Payer: PPO | Admitting: Nurse Practitioner

## 2015-08-10 VITALS — BP 108/68 | HR 72 | Temp 97.9°F | Resp 14 | Ht 68.0 in | Wt 203.0 lb

## 2015-08-10 DIAGNOSIS — E1065 Type 1 diabetes mellitus with hyperglycemia: Secondary | ICD-10-CM

## 2015-08-10 DIAGNOSIS — E1059 Type 1 diabetes mellitus with other circulatory complications: Secondary | ICD-10-CM | POA: Diagnosis not present

## 2015-08-10 DIAGNOSIS — IMO0002 Reserved for concepts with insufficient information to code with codable children: Secondary | ICD-10-CM

## 2015-08-10 NOTE — Patient Instructions (Signed)
We will call when we get your referral to Endocrinology and when we get your meter figured out.   Eggs, cheese, meat!

## 2015-08-10 NOTE — Progress Notes (Signed)
Pre visit review using our clinic review tool, if applicable. No additional management support is needed unless otherwise documented below in the visit note. 

## 2015-08-10 NOTE — Progress Notes (Signed)
Patient ID: Todd Clay, male    DOB: 1945/08/21  Age: 70 y.o. MRN: [id number]  CC: Follow-up   HPI Todd Clay presents for follow up of labs.   1) Uncontrolled DM type I. Pt has no meter currently and reports the pharmacy didn't know which one would fit his formulary so nothing was done and he is asking if I would call his insurance company.   Lab Results  Component Value Date   HGBA1C 8.8* 07/27/2015   HGBA1C 8.3* 09/24/2014   HGBA1C 10.2* 06/19/2014   Lab Results  Component Value Date   MICROALBUR 2.0* 11/18/2013   CREATININE 0.96 07/27/2015   Discussed proper nutrition and he is drinking Coke Zeros and eating without regards to carb counting.   1 recent case of subjective hypoglycemia, wife found him on the floor and gave him syrup, which revived him she reports. There was no follow up after this until the last appointment.   He reports he sometimes   History Todd Clay has a past medical history of History of chickenpox; Diabetes mellitus with complication ([place]); CAD (coronary artery disease); Retinal detachment; Osteomyelitis ([place]); Diabetic neuropathy ([place]); PVD (peripheral vascular disease) ([place]); HTN (hypertension); HLD (hyperlipidemia); Depression; Proliferative diabetic retinopathy ([place]); and MI (myocardial infarction) ([place]).   He has past surgical history that includes Amputation of replicated toes; STENTS; Retinal detachment surgery; VITREOUS RETINAL SURGERY (1985); Coronary artery bypass graft (1998); Cardiac catheterization; and Coronary angioplasty.   His family history includes Cancer in his brother; Diabetes in his maternal aunt, maternal grandfather, maternal grandmother, and maternal uncle; Heart disease in his father; Kidney disease in his mother.He reports that he has never smoked. He does not have any smokeless tobacco history on file. He reports that he does not drink alcohol or use illicit drugs.  Outpatient Prescriptions Prior to Visit  Medication Sig  Dispense Refill  . blood glucose meter kit and supplies KIT Dispense based on patient and insurance preference. Use up to four times daily as directed. (FOR ICD-9 250.00, 250.01). 1 each 0  . clopidogrel (PLAVIX) 75 MG tablet TAKE ONE TABLET BY MOUTH ONCE DAILY WITH BREAKFAST 30 tablet 1  . gabapentin (NEURONTIN) 300 MG capsule Take 3 capsules (900 mg total) by mouth 3 (three) times daily. 270 capsule 6  . glucagon (GLUCAGON EMERGENCY) 1 MG injection Inject 1 mg into the muscle once as needed (in case of severe hypoglycemia). 1 each 12  . insulin regular (NOVOLIN R RELION) 100 units/mL injection Inject 0.06 mLs (6 Units total) into the skin 3 (three) times daily before meals. 10 mL 2  . LANTUS 100 UNIT/ML injection INJECT 18 UNITS INTO THE SKIN DAILY 10 mL 5  . simvastatin (ZOCOR) 80 MG tablet TAKE ONE TABLET BY MOUTH ONCE DAILY AT 6PM 90 tablet 2  . KLOR-CON M20 20 MEQ tablet      No facility-administered medications prior to visit.    ROS Review of Systems  Constitutional: Negative for fever, chills, diaphoresis and fatigue.  Eyes: Negative for visual disturbance.  Respiratory: Negative for chest tightness, shortness of breath and wheezing.   Cardiovascular: Negative for chest pain, palpitations and leg swelling.  Gastrointestinal: Negative for nausea, vomiting and diarrhea.  Endocrine: Positive for polydipsia and polyuria. Negative for polyphagia.  Skin: Negative for rash.  Neurological: Positive for weakness. Negative for dizziness and numbness.  Psychiatric/Behavioral: The patient is not nervous/anxious.     Objective:  BP 108/68 mmHg  Pulse 72  Temp(Src) 97.9 F (36.6  C) (Oral)  Resp 14  Ht _0  (1.727 m)  Wt 203 lb (92.08 kg)  BMI 30.87 kg/m2  SpO2 97%  Physical Exam  Constitutional: He is oriented to person, place, and time. He appears well-developed and well-nourished. No distress.  HENT:  Head: Normocephalic and atraumatic.  Right Ear: External ear normal.  Left  Ear: External ear normal.  Cardiovascular: Normal rate and regular rhythm.   Pulmonary/Chest: Effort normal and breath sounds normal. No respiratory distress. He has no wheezes. He has no rales. He exhibits no tenderness.  Neurological: He is alert and oriented to person, place, and time.  Skin: Skin is warm and dry. No rash noted. He is not diaphoretic.  Psychiatric: He has a normal mood and affect. His behavior is normal. Judgment and thought content normal.   Assessment & Plan:   Deen was seen today for follow-up.  Diagnoses and all orders for this visit:  Uncontrolled type 1 diabetes mellitus with other circulatory complication ([place]) -     Ambulatory referral to Endocrinology   I have discontinued Mr. Skoczylas KLOR-CON M20. I am also having him maintain his glucagon, insulin regular, gabapentin, simvastatin, LANTUS, clopidogrel, and blood glucose meter kit and supplies.  No orders of the defined types were placed in this encounter.     Follow-up: Return if symptoms worsen or fail to improve.

## 2015-08-11 ENCOUNTER — Telehealth: Payer: Self-pay

## 2015-08-11 NOTE — Telephone Encounter (Signed)
Patient's wife called back, reviewed information with her.  She will return a call to [place] once they pick a meter so we can order the lancets and test strips.  Thanks

## 2015-08-11 NOTE — Telephone Encounter (Signed)
-----   Message from Rubbie Battiest, NP sent at 08/11/2015  9:04 AM EDT ----- Sorry for the messages, I'm leaving for the morning after this I promise haha.   So yesterday I saw Todd Clay who wanted a meter, but his pharmacy didn't know what was covered. Called his insurance co this morning and they gave me this info:  Free meters- Few options to get (Freestyle, Provision, or 1 other I couldn't catch the name of). 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 They will give him the details on receiving the free meter and I can call in lancets/strips after we get this info and he chooses one.   Thanks! Morey Hummingbird

## 2015-08-11 NOTE — Telephone Encounter (Signed)
I attempted to call the patient and left a brief message to return my call to discuss glucometer's.

## 2015-08-19 NOTE — Assessment & Plan Note (Signed)
Lab Results  Component Value Date   HGBA1C 8.8* 07/27/2015   HGBA1C 8.3* 09/24/2014   HGBA1C 10.2* 06/19/2014   Lab Results  Component Value Date   MICROALBUR 2.0* 11/18/2013   CREATININE 0.96 07/27/2015   Uncontrolled. Calling insurance company (have free meters and gave wife info on Tuesday). Pt is willing to see endocrinology. Saw Dr. Howell Rucks in the past

## 2015-08-24 ENCOUNTER — Other Ambulatory Visit: Payer: Self-pay | Admitting: Cardiovascular Disease

## 2015-08-24 ENCOUNTER — Telehealth: Payer: Self-pay | Admitting: Cardiovascular Disease

## 2015-08-24 ENCOUNTER — Other Ambulatory Visit: Payer: Self-pay | Admitting: *Deleted

## 2015-08-24 MED ORDER — CLOPIDOGREL BISULFATE 75 MG PO TABS: ORAL_TABLET | ORAL | Status: DC

## 2015-08-24 NOTE — Telephone Encounter (Signed)
Pt wife calling needing to refill medication   *STAT* If patient is at the pharmacy, call can be transferred to refill team.   1. Which medications need to be refilled? (please list name of each medication and dose if known)  Plavix (generic)   2. Which pharmacy/location (including street and city if local pharmacy) is medication to be sent to? [facility] on [address]  3. Do they need a 30 day or 90 day supply? 90 day

## 2015-09-21 ENCOUNTER — Other Ambulatory Visit: Payer: Self-pay | Admitting: Nurse Practitioner

## 2015-10-06 DIAGNOSIS — Z79899 Other long term (current) drug therapy: Secondary | ICD-10-CM | POA: Diagnosis not present

## 2015-10-06 DIAGNOSIS — E1065 Type 1 diabetes mellitus with hyperglycemia: Secondary | ICD-10-CM | POA: Diagnosis not present

## 2015-10-29 DIAGNOSIS — L820 Inflamed seborrheic keratosis: Secondary | ICD-10-CM | POA: Diagnosis not present

## 2015-10-29 DIAGNOSIS — L821 Other seborrheic keratosis: Secondary | ICD-10-CM | POA: Diagnosis not present

## 2015-10-29 DIAGNOSIS — D229 Melanocytic nevi, unspecified: Secondary | ICD-10-CM | POA: Diagnosis not present

## 2015-10-29 DIAGNOSIS — L578 Other skin changes due to chronic exposure to nonionizing radiation: Secondary | ICD-10-CM | POA: Diagnosis not present

## 2015-10-29 DIAGNOSIS — L570 Actinic keratosis: Secondary | ICD-10-CM | POA: Diagnosis not present

## 2015-11-04 DIAGNOSIS — N528 Other male erectile dysfunction: Secondary | ICD-10-CM | POA: Diagnosis not present

## 2015-11-04 DIAGNOSIS — E1065 Type 1 diabetes mellitus with hyperglycemia: Secondary | ICD-10-CM | POA: Diagnosis not present

## 2015-11-04 DIAGNOSIS — Z79899 Other long term (current) drug therapy: Secondary | ICD-10-CM | POA: Diagnosis not present

## 2015-12-09 DIAGNOSIS — E1065 Type 1 diabetes mellitus with hyperglycemia: Secondary | ICD-10-CM | POA: Diagnosis not present

## 2015-12-16 DIAGNOSIS — Z79899 Other long term (current) drug therapy: Secondary | ICD-10-CM | POA: Diagnosis not present

## 2015-12-16 DIAGNOSIS — E1065 Type 1 diabetes mellitus with hyperglycemia: Secondary | ICD-10-CM | POA: Diagnosis not present

## 2015-12-23 ENCOUNTER — Encounter: Payer: Self-pay | Admitting: Cardiovascular Disease

## 2015-12-23 ENCOUNTER — Ambulatory Visit (INDEPENDENT_AMBULATORY_CARE_PROVIDER_SITE_OTHER): Payer: PPO | Admitting: Cardiovascular Disease

## 2015-12-23 VITALS — BP 140/60 | HR 78 | Ht 68.0 in | Wt 200.2 lb

## 2015-12-23 DIAGNOSIS — I739 Peripheral vascular disease, unspecified: Secondary | ICD-10-CM

## 2015-12-23 DIAGNOSIS — I951 Orthostatic hypotension: Secondary | ICD-10-CM

## 2015-12-23 DIAGNOSIS — E785 Hyperlipidemia, unspecified: Secondary | ICD-10-CM

## 2015-12-23 DIAGNOSIS — I25119 Atherosclerotic heart disease of native coronary artery with unspecified angina pectoris: Secondary | ICD-10-CM

## 2015-12-23 DIAGNOSIS — E118 Type 2 diabetes mellitus with unspecified complications: Secondary | ICD-10-CM

## 2015-12-23 DIAGNOSIS — Z951 Presence of aortocoronary bypass graft: Secondary | ICD-10-CM

## 2015-12-23 NOTE — Patient Instructions (Addendum)
Medication Instructions:   No changes to medications  Testing/Procedures:  We will order a lower extremity arterial ultrasound for claudication  Date & time: ___________________________  Follow-Up: It was a pleasure seeing you in the office today. Please call us if you have new issues that need to be addressed before your next appt.  [phone]  Your physician wants you to follow-up in: 6 months.  You will receive a reminder letter in the mail two months in advance. If you don't receive a letter, please call our office to schedule the follow-up appointment.  If you need a refill on your cardiac medications before your next appointment, please call your pharmacy.     Intermittent Claudication Intermittent claudication is pain in your leg that occurs when you walk or exercise and goes away when you rest. The pain can occur in one or both legs. CAUSES Intermittent claudication is caused by the buildup of plaque within the major arteries in the body (atherosclerosis). The plaque, which makes arteries stiff and narrow, prevents enough blood from reaching your leg muscles. The pain occurs when you walk or exercise because your muscles need more blood when you are moving and exercising. RISK FACTORS Risk factors include:  A family history of atherosclerosis.  A personal history of stroke or heart disease.  Older age.  Being inactive or overweight.  Smoking cigarettes.  Having another health condition such as:  Diabetes.  High blood pressure.  High cholesterol. SIGNS AND SYMPTOMS  Your hip or leg may:   Ache.  Cramp.  Feel tight.  Feel weak.  Feel heavy. Over time, you may feel pain in your calf, thigh, or hip. DIAGNOSIS  Your health care provider may diagnose intermittent claudication based on your symptoms and medical history. Your health care provider may also do tests to learn more about your condition. These may include:  Blood tests.  An  ultrasound.  Imaging tests such as angiography, magnetic resonance angiography (MRA), and computed tomography angiography (CTA). TREATMENT You may be treated for problems such as:  High blood pressure.  High cholesterol.  Diabetes. Other treatments may include:  Lifestyle changes such as:  Starting an exercise program.  Losing weight.  Quitting smoking.  Medicines to help restore blood flow through your legs.  Blood vessel surgery (angioplasty) to restore blood flow if your intermittent claudication is caused by severe peripheral artery disease. HOME CARE INSTRUCTIONS  Manage any other health conditions you have.  Eat a diet low in saturated fats and calories to maintain a healthy weight.  Quit smoking, if you smoke.  Take medicines only as directed by your health care provider.  If your health care provider recommended an exercise program for you, follow it as directed. Your exercise program may involve:  Walking three or more times a week.  Walking until you have certain symptoms of intermittent claudication.  Resting until symptoms go away.  Gradually increasing walking time to about 50 minutes a day. SEEK MEDICAL CARE IF: Your condition is not getting better or is getting worse. SEEK IMMEDIATE MEDICAL CARE IF:   You have chest pain.  You have difficulty breathing.  You develop arm weakness.  You have trouble speaking.  Your face begins to droop. MAKE SURE YOU:  Understand these instructions.  Will watch your condition.  Will get help if you are not doing well or get worse.   This information is not intended to replace advice given to you by your health care provider. Make sure you  discuss any questions you have with your health care provider.   Document Released: 03/18/2004 Document Revised: 06/06/2014 Document Reviewed: 08/22/2013 Elsevier Interactive Patient Education [employer].

## 2015-12-23 NOTE — Progress Notes (Signed)
Cardiology Office Note  Date:  12/23/2015   ID:  FRAN MCREE, DOB [birth year], MRN [id number]  PCP:  Rubbie Battiest, NP   Chief Complaint  Patient presents with  . Other    6 month f/u. Meds reviewed verbally with pt.    HPI:  Mr. Kidney is a pleasant 70 year old gentleman with history of CAD, bypass surgery December 1997 with LIMA to the LAD at [facility], several catheterizations since that time with stents placed, stent to the left circumflex and RCA in 2005, history of poorly controlled diabetes, with PAD,toe  ostiomyelitis requiring PICC line and long-term antibiotics, who presents for routine followup of his coronary artery disease Prior history of orthostasis Carotid u/s 40 to 59% on the left  In follow-up today, he reports that he does not have a primary care physician Recently seen by endocrine at [facility] HBA1C: 9.9 Significant dietary noncompliance Total chol 140,LDL 74 on simvastatin 80 mg daily Denies any chest pain concerning for angina Reports blood pressure well controlled, no orthostasis symptoms On Plavix, stopped aspirin on his own He reports having significant leg pain when he walks, somewhat better with gabapentin  EKG on today's visit shows normal sinus rhythm with rate 78 bpm, nonspecific T-wave, ST abnormality  Other past medical history reviewed  on a prior office visit,pressure in the 90s, metoprolol was held, torsemide was held with dramatic improvement of his symptoms. He had shaking, freezing, required assistance at times. Since the medication changes, he reports he is had no further episodes. He did have significant workup in [place] including MRI, EEG in September 2016  Cardiac catheterization in 2008 at [facility] showed 99% proximal RCA disease with endeavor DES placed 2.5 x 15 mm, also with 60% distal RCA disease, 100% OM1 disease followed by 50%, 80% proximal LAD disease, 100% followed by 90% mid LAD disease, 100% diagonal disease Normal  ejection fraction at that time estimated greater than 60%  Prior cardiac catheterization in July 2005 details 60% proximal RCA followed by 50%, 90% and 70% distal RCA disease 90% mid left circumflex disease, 90% OM 3 disease, 70% proximal LAD disease extending to the mid vessel, 100% mid LAD disease, 70% distal LAD disease  Cardiac catheterization July 2000 with 70% proximal RCA disease, 90% mid RCA disease, 70% distal RCA disease, 70% proximal left circumflex disease, 70% OM1 disease, 100% OM 2 disease, 70% proximal LAD to mid LAD disease, 70% mid and distal LAD disease, 100% diagonal disease   lab work in the hospital 07/26/2013 shows creatinine 1.43, BUN 21, potassium 4.1 Total cholesterol 130, hemoglobin A1c of 10  PMH:   has a past medical history of CAD (coronary artery disease); Depression; Diabetes mellitus with complication ([place]); Diabetic neuropathy ([place]); History of chickenpox; HLD (hyperlipidemia); HTN (hypertension); MI (myocardial infarction) ([place]); Osteomyelitis ([place]); Proliferative diabetic retinopathy ([place]); PVD (peripheral vascular disease) ([place]); and Retinal detachment.  PSH:    Past Surgical History:  Procedure Laterality Date  . 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 CARDIAC CATHETERIZATION    . CORONARY ANGIOPLASTY     Patient has a total of 5 stents placed.  . CORONARY ARTERY BYPASS GRAFT  1998   x1 vessel @ [facility]  . RETINAL DETACHMENT SURGERY     Laser  . STENTS     x 5   . VITREOUS RETINAL SURGERY  1985   Laser tx [facility]    Current Outpatient Prescriptions  Medication Sig Dispense  Refill  . blood glucose meter kit and supplies KIT Dispense based on patient and insurance preference. Use up to four times daily as directed. (FOR ICD-9 250.00, 250.01). 1 each 0  . clopidogrel (PLAVIX) 75 MG tablet TAKE ONE TABLET BY MOUTH ONCE DAILY WITH BREAKFAST *CALL OFFICE TO SCHEDULE A 6 MONTH APPOINTMENT FOR FUTURE REFILLS* 90 tablet 3  . gabapentin  (NEURONTIN) 300 MG capsule TAKE THREE CAPSULES BY MOUTH THREE TIMES DAILY 270 capsule 1  . glucagon (GLUCAGON EMERGENCY) 1 MG injection Inject 1 mg into the muscle once as needed (in case of severe hypoglycemia). 1 each 12  . insulin regular (NOVOLIN R RELION) 100 units/mL injection Inject 0.06 mLs (6 Units total) into the skin 3 (three) times daily before meals. 10 mL 2  . LANTUS 100 UNIT/ML injection INJECT 18 UNITS INTO THE SKIN DAILY 10 mL 5  . simvastatin (ZOCOR) 80 MG tablet TAKE ONE TABLET BY MOUTH ONCE DAILY AT 6PM 90 tablet 2   No current facility-administered medications for this visit.      Allergies:   Review of patient's allergies indicates no known allergies.   Social History:  The patient  reports that he has never smoked. He does not have any smokeless tobacco history on file. He reports that he does not drink alcohol or use drugs.   Family History:   family history includes Cancer in his brother; Diabetes in his maternal aunt, maternal grandfather, maternal grandmother, and maternal uncle; Heart disease in his father; Kidney disease in his mother.    Review of Systems: Review of Systems  Constitutional: Negative.   Respiratory: Negative.   Cardiovascular: Negative.   Gastrointestinal: Negative.   Musculoskeletal:       Leg pain on ambulation  Neurological: Negative.   Psychiatric/Behavioral: Negative.   All other systems reviewed and are negative.    PHYSICAL EXAM: VS:  BP 140/60 (BP Location: Left Arm, Patient Position: Sitting, Cuff Size: Normal)   Pulse 78   Ht _0  (1.727 m)   Wt 200 lb 4 oz (90.8 kg)   BMI 30.45 kg/m   , BMI Body mass index is 30.45 kg/m. GEN: Well nourished, well developed, in no acute distress  HEENT: normal  Neck: no JVD, carotid bruits, or masses Cardiac: RRR; no murmurs, rubs, or gallops,no edema .   decreased pulses lower extremities  Respiratory:  clear to auscultation bilaterally, normal work of breathing GI: soft,  nontender, nondistended, + BS MS: no deformity or atrophy  Skin: warm and dry, no rash Neuro:  Strength and sensation are intact Psych: euthymic mood, full affect    Recent Labs: 07/27/2015: ALT 12; BUN 15; Creatinine, Ser 0.96; Hemoglobin 12.2; Platelets 192.0; Potassium 4.4; Sodium 135    Lipid Panel No results found for: CHOL, HDL, LDLCALC, TRIG    Wt Readings from Last 3 Encounters:  12/23/15 200 lb 4 oz (90.8 kg)  08/10/15 203 lb (92.1 kg)  07/27/15 205 lb 12.8 oz (93.4 kg)       ASSESSMENT AND PLAN:  Coronary artery disease with unspecified angina pectoris - Plan: EKG 12-Lead, VAS [place] LOWER EXTREMITY ARTERIAL DUPLEX Currently with no symptoms of angina. No further workup at this time. Continue current medication regimen.  Claudication ([place]) - Plan: VAS [place] LOWER EXTREMITY ARTERIAL DUPLEX Long discussion concerning his leg pain Symptoms concerning for claudication. Decreased pulsation on exam Lower extremity arterial Doppler has been ordered  Hyperlipidemia Cholesterol is at goal on the current lipid regimen. No  changes to the medications were made.  Orthostatic hypotension Symptoms resolved in the past by decreasing his medication Blood pressure stable with no orthostasis symptoms  Diabetes mellitus with complication [facility]) Managed by endocrinology Long discussion concerning his diet. Recommended low carbohydrates, He has had recent difficulty obtaining his insulin and testing supplies  Hx of CABG No active angina, no further symptoms at this time High risk of recurrent/worsening disease given poorly controlled diabetes    Total encounter time more than 25 minutes  Greater than 50% was spent in counseling and coordination of care with the patient   Disposition:   F/U  6 months   Orders Placed This Encounter  Procedures  . EKG 12-Lead     Signed, Esmond Plants, M.D., Ph.D. 12/23/2015  [place], [place]

## 2015-12-31 DIAGNOSIS — D485 Neoplasm of uncertain behavior of skin: Secondary | ICD-10-CM | POA: Diagnosis not present

## 2015-12-31 DIAGNOSIS — L219 Seborrheic dermatitis, unspecified: Secondary | ICD-10-CM | POA: Diagnosis not present

## 2015-12-31 DIAGNOSIS — L570 Actinic keratosis: Secondary | ICD-10-CM | POA: Diagnosis not present

## 2015-12-31 DIAGNOSIS — L718 Other rosacea: Secondary | ICD-10-CM | POA: Diagnosis not present

## 2015-12-31 DIAGNOSIS — D2239 Melanocytic nevi of other parts of face: Secondary | ICD-10-CM | POA: Diagnosis not present

## 2015-12-31 DIAGNOSIS — L820 Inflamed seborrheic keratosis: Secondary | ICD-10-CM | POA: Diagnosis not present

## 2016-01-04 ENCOUNTER — Other Ambulatory Visit: Payer: Self-pay | Admitting: Cardiovascular Disease

## 2016-01-04 DIAGNOSIS — I739 Peripheral vascular disease, unspecified: Secondary | ICD-10-CM

## 2016-01-13 ENCOUNTER — Ambulatory Visit: Payer: PPO

## 2016-01-13 DIAGNOSIS — I739 Peripheral vascular disease, unspecified: Secondary | ICD-10-CM

## 2016-01-13 DIAGNOSIS — I25119 Atherosclerotic heart disease of native coronary artery with unspecified angina pectoris: Secondary | ICD-10-CM

## 2016-01-13 DIAGNOSIS — R0989 Other specified symptoms and signs involving the circulatory and respiratory systems: Secondary | ICD-10-CM | POA: Diagnosis not present

## 2016-03-22 ENCOUNTER — Other Ambulatory Visit: Payer: Self-pay | Admitting: Cardiovascular Disease

## 2016-03-22 ENCOUNTER — Other Ambulatory Visit: Payer: Self-pay | Admitting: Nurse Practitioner

## 2016-03-22 NOTE — Telephone Encounter (Signed)
Pt last saw Lorane Gell 08/10/15. I cannot find a recent lipid panel in pt's chart. Please advise on refill. No scheduled follow up visit.

## 2016-03-30 DIAGNOSIS — E1065 Type 1 diabetes mellitus with hyperglycemia: Secondary | ICD-10-CM | POA: Diagnosis not present

## 2016-04-06 DIAGNOSIS — E1065 Type 1 diabetes mellitus with hyperglycemia: Secondary | ICD-10-CM | POA: Diagnosis not present

## 2016-04-14 DIAGNOSIS — N528 Other male erectile dysfunction: Secondary | ICD-10-CM | POA: Diagnosis not present

## 2016-04-14 DIAGNOSIS — E1065 Type 1 diabetes mellitus with hyperglycemia: Secondary | ICD-10-CM | POA: Diagnosis not present

## 2016-04-14 DIAGNOSIS — Z79899 Other long term (current) drug therapy: Secondary | ICD-10-CM | POA: Diagnosis not present

## 2016-06-20 ENCOUNTER — Encounter: Payer: Self-pay | Admitting: Cardiovascular Disease

## 2016-06-20 ENCOUNTER — Ambulatory Visit (INDEPENDENT_AMBULATORY_CARE_PROVIDER_SITE_OTHER): Payer: PPO | Admitting: Cardiovascular Disease

## 2016-06-20 VITALS — BP 110/80 | HR 84 | Ht 68.0 in | Wt 203.2 lb

## 2016-06-20 DIAGNOSIS — Z951 Presence of aortocoronary bypass graft: Secondary | ICD-10-CM | POA: Diagnosis not present

## 2016-06-20 DIAGNOSIS — I739 Peripheral vascular disease, unspecified: Secondary | ICD-10-CM

## 2016-06-20 DIAGNOSIS — I951 Orthostatic hypotension: Secondary | ICD-10-CM

## 2016-06-20 DIAGNOSIS — E1065 Type 1 diabetes mellitus with hyperglycemia: Secondary | ICD-10-CM

## 2016-06-20 DIAGNOSIS — E1059 Type 1 diabetes mellitus with other circulatory complications: Secondary | ICD-10-CM | POA: Diagnosis not present

## 2016-06-20 DIAGNOSIS — I25118 Atherosclerotic heart disease of native coronary artery with other forms of angina pectoris: Secondary | ICD-10-CM

## 2016-06-20 DIAGNOSIS — IMO0002 Reserved for concepts with insufficient information to code with codable children: Secondary | ICD-10-CM

## 2016-06-20 NOTE — Progress Notes (Signed)
Cardiology Office Note  Date:  06/20/2016   ID:  Todd Clay, Todd Clay 31-Dec-1945, MRN [id number]  PCP:  Rubbie Battiest, NP   Chief Complaint  Patient presents with  . other    6 month follow up. Meds reviewed by the pt. verbally. "doing well."     HPI:  Todd Clay is a pleasant 71 year old gentleman with history of CAD, bypass surgery December 1997 with LIMA to the LAD at [facility], several catheterizations since that time with stents placed, stent to the left circumflex and RCA in 2005, history of poorly controlled diabetes, with PAD,toe  ostiomyelitis requiring PICC line and long-term antibiotics, who presents for routine followup of his coronary artery disease Prior history of orthostasis Carotid u/s 40 to 59% on the left  Feels well,  Bad diet through the holiday Feels his sugar may have run high  HBA1C 8.8 Down from 10.2 On insulin,  nonsmoker Significant dietary noncompliance Total chol 140,LDL 74 on simvastatin 80 mg daily  Denies any chest pain concerning for angina  Reports blood pressure well controlled, no orthostasis symptoms On Plavix, stopped aspirin on his own in the past  EKG on today's visit shows normal sinus rhythm with rate 84 bpm, nonspecific T-wave, abn P wave, no change since July 2017 Previous EKGs discussed with him.  There was change in P wave early 2017 compared to July 2017  Other past medical history reviewed  on a prior office visit,pressure in the 90s, metoprolol was held, torsemide was held with dramatic improvement of his symptoms. He had shaking, freezing, required assistance at times. Since the medication changes, he reports he is had no further episodes. He did have significant workup in [place] including MRI, EEG in September 2016  Cardiac catheterization in 2008 at [facility] showed 99% proximal RCA disease with endeavor DES placed 2.5 x 15 mm, also with 60% distal RCA disease, 100% OM1 disease followed by 50%, 80% proximal LAD  disease, 100% followed by 90% mid LAD disease, 100% diagonal disease Normal ejection fraction at that time estimated greater than 60%  Prior cardiac catheterization in July 2005 details 60% proximal RCA followed by 50%, 90% and 70% distal RCA disease 90% mid left circumflex disease, 90% OM 3 disease, 70% proximal LAD disease extending to the mid vessel, 100% mid LAD disease, 70% distal LAD disease  Cardiac catheterization July 2000 with 70% proximal RCA disease, 90% mid RCA disease, 70% distal RCA disease, 70% proximal left circumflex disease, 70% OM1 disease, 100% OM 2 disease, 70% proximal LAD to mid LAD disease, 70% mid and distal LAD disease, 100% diagonal disease   lab work in the hospital 07/26/2013 shows creatinine 1.43, BUN 21, potassium 4.1 Total cholesterol 130, hemoglobin A1c of 10  PMH:   has a past medical history of CAD (coronary artery disease); Depression; Diabetes mellitus with complication ([place]); Diabetic neuropathy ([place]); History of chickenpox; HLD (hyperlipidemia); HTN (hypertension); MI (myocardial infarction); Osteomyelitis ([place]); Proliferative diabetic retinopathy ([place]); PVD (peripheral vascular disease) ([place]); and Retinal detachment.  PSH:    Past Surgical History:  Procedure Laterality Date  . 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 CARDIAC CATHETERIZATION    . CORONARY ANGIOPLASTY     Patient has a total of 5 stents placed.  . CORONARY ARTERY BYPASS GRAFT  1998   x1 vessel @ [facility]  . RETINAL DETACHMENT SURGERY     Laser  . STENTS     x 5   .  VITREOUS RETINAL SURGERY  1985   Laser tx [facility]    Current Outpatient Prescriptions  Medication Sig Dispense Refill  . blood glucose meter kit and supplies KIT Dispense based on patient and insurance preference. Use up to four times daily as directed. (FOR ICD-9 250.00, 250.01). 1 each 0  . clopidogrel (PLAVIX) 75 MG tablet TAKE ONE TABLET BY MOUTH ONCE DAILY WITH BREAKFAST *CALL OFFICE TO  SCHEDULE A 6 MONTH APPOINTMENT FOR FUTURE REFILLS* 90 tablet 3  . gabapentin (NEURONTIN) 300 MG capsule TAKE THREE CAPSULES BY MOUTH THREE TIMES DAILY 270 capsule 1  . glucagon (GLUCAGON EMERGENCY) 1 MG injection Inject 1 mg into the muscle once as needed (in case of severe hypoglycemia). 1 each 12  . insulin regular (NOVOLIN R RELION) 100 units/mL injection Inject 0.06 mLs (6 Units total) into the skin 3 (three) times daily before meals. 10 mL 2  . LANTUS 100 UNIT/ML injection INJECT 18 UNITS INTO THE SKIN DAILY 10 mL 5  . simvastatin (ZOCOR) 80 MG tablet TAKE ONE TABLET BY MOUTH ONCE DAILY AT SIX IN THE EVENING (6PM) 90 tablet 2   No current facility-administered medications for this visit.      Allergies:   Patient has no known allergies.   Social History:  The patient  reports that he has never smoked. He has never used smokeless tobacco. He reports that he does not drink alcohol or use drugs.   Family History:   family history includes Cancer in his brother; Diabetes in his maternal aunt, maternal grandfather, maternal grandmother, and maternal uncle; Heart disease in his father; Kidney disease in his mother.    Review of Systems: Review of Systems  Constitutional: Negative.   Respiratory: Negative.   Cardiovascular: Negative.   Gastrointestinal: Negative.   Musculoskeletal: Negative.   Neurological: Negative.   Psychiatric/Behavioral: Negative.   All other systems reviewed and are negative.    PHYSICAL EXAM: VS:  BP 110/80 (BP Location: Left Arm, Patient Position: Sitting, Cuff Size: Normal)   Pulse 84   Ht _0  (1.727 m)   Wt 203 lb 4 oz (92.2 kg)   BMI 30.90 kg/m  , BMI Body mass index is 30.9 kg/m. GEN: Well nourished, well developed, in no acute distress  HEENT: normal  Neck: no JVD, carotid bruits, or masses Cardiac: RRR; no murmurs, rubs, or gallops,no edema  Respiratory:  clear to auscultation bilaterally, normal work of breathing GI: soft, nontender,  nondistended, + BS MS: no deformity or atrophy  Skin: warm and dry, no rash Neuro:  Strength and sensation are intact Psych: euthymic mood, full affect    Recent Labs: 07/27/2015: ALT 12; BUN 15; Creatinine, Ser 0.96; Hemoglobin 12.2; Platelets 192.0; Potassium 4.4; Sodium 135    Lipid Panel No results found for: CHOL, HDL, LDLCALC, TRIG    Wt Readings from Last 3 Encounters:  06/20/16 203 lb 4 oz (92.2 kg)  12/23/15 200 lb 4 oz (90.8 kg)  08/10/15 203 lb (92.1 kg)       ASSESSMENT AND PLAN:  Hx of CABG - Plan: EKG 12-Lead Denies sx  recommended he should call for angina sx He does not want nitroglycerin or further testing   Claudication ([place]) - Plan: EKG 12-Lead Stable, walking without pain   Orthostatic hypotension - Plan: EKG 12-Lead No dizzy sx  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 No further workup at this time. Continue current medication regimen.  Obesity We have encouraged continued exercise, careful diet management in an effort to lose weight.    Total encounter time more than 25 minutes  Greater than 50% was spent in counseling and coordination of care with the patient   Disposition:   F/U  6 months   Orders Placed This Encounter  Procedures  . EKG 12-Lead     Signed, Esmond Plants, M.D., Ph.D. 06/20/2016  [place], [place]

## 2016-06-20 NOTE — Patient Instructions (Signed)

## 2016-07-07 DIAGNOSIS — L821 Other seborrheic keratosis: Secondary | ICD-10-CM | POA: Diagnosis not present

## 2016-07-07 DIAGNOSIS — D692 Other nonthrombocytopenic purpura: Secondary | ICD-10-CM | POA: Diagnosis not present

## 2016-07-07 DIAGNOSIS — L97509 Non-pressure chronic ulcer of other part of unspecified foot with unspecified severity: Secondary | ICD-10-CM | POA: Diagnosis not present

## 2016-07-07 DIAGNOSIS — L578 Other skin changes due to chronic exposure to nonionizing radiation: Secondary | ICD-10-CM | POA: Diagnosis not present

## 2016-07-07 DIAGNOSIS — Z1283 Encounter for screening for malignant neoplasm of skin: Secondary | ICD-10-CM | POA: Diagnosis not present

## 2016-07-07 DIAGNOSIS — L97511 Non-pressure chronic ulcer of other part of right foot limited to breakdown of skin: Secondary | ICD-10-CM | POA: Diagnosis not present

## 2016-07-07 DIAGNOSIS — E11621 Type 2 diabetes mellitus with foot ulcer: Secondary | ICD-10-CM | POA: Diagnosis not present

## 2016-07-07 DIAGNOSIS — D229 Melanocytic nevi, unspecified: Secondary | ICD-10-CM | POA: Diagnosis not present

## 2016-07-07 DIAGNOSIS — L820 Inflamed seborrheic keratosis: Secondary | ICD-10-CM | POA: Diagnosis not present

## 2016-07-07 DIAGNOSIS — E1142 Type 2 diabetes mellitus with diabetic polyneuropathy: Secondary | ICD-10-CM | POA: Diagnosis not present

## 2016-07-11 DIAGNOSIS — E1065 Type 1 diabetes mellitus with hyperglycemia: Secondary | ICD-10-CM | POA: Diagnosis not present

## 2016-08-17 DIAGNOSIS — Z79899 Other long term (current) drug therapy: Secondary | ICD-10-CM | POA: Diagnosis not present

## 2016-08-17 DIAGNOSIS — E1065 Type 1 diabetes mellitus with hyperglycemia: Secondary | ICD-10-CM | POA: Diagnosis not present

## 2016-09-05 DIAGNOSIS — L820 Inflamed seborrheic keratosis: Secondary | ICD-10-CM | POA: Diagnosis not present

## 2016-09-05 DIAGNOSIS — L821 Other seborrheic keratosis: Secondary | ICD-10-CM | POA: Diagnosis not present

## 2016-09-05 DIAGNOSIS — L99 Other disorders of skin and subcutaneous tissue in diseases classified elsewhere: Secondary | ICD-10-CM | POA: Diagnosis not present

## 2016-09-05 DIAGNOSIS — D485 Neoplasm of uncertain behavior of skin: Secondary | ICD-10-CM | POA: Diagnosis not present

## 2016-09-05 DIAGNOSIS — D229 Melanocytic nevi, unspecified: Secondary | ICD-10-CM | POA: Diagnosis not present

## 2016-09-05 DIAGNOSIS — D224 Melanocytic nevi of scalp and neck: Secondary | ICD-10-CM | POA: Diagnosis not present

## 2016-10-11 ENCOUNTER — Telehealth: Payer: Self-pay | Admitting: Cardiovascular Disease

## 2016-10-11 ENCOUNTER — Ambulatory Visit (INDEPENDENT_AMBULATORY_CARE_PROVIDER_SITE_OTHER): Payer: PPO | Admitting: Cardiovascular Disease

## 2016-10-11 ENCOUNTER — Encounter: Payer: Self-pay | Admitting: Cardiovascular Disease

## 2016-10-11 VITALS — BP 114/62 | HR 82 | Ht 68.0 in | Wt 208.8 lb

## 2016-10-11 DIAGNOSIS — I509 Heart failure, unspecified: Secondary | ICD-10-CM

## 2016-10-11 DIAGNOSIS — R0602 Shortness of breath: Secondary | ICD-10-CM

## 2016-10-11 MED ORDER — TORSEMIDE 20 MG PO TABS: 20.0000 mg | ORAL_TABLET | Freq: Two times a day (BID) | ORAL | 3 refills | Status: DC | PRN

## 2016-10-11 MED ORDER — POTASSIUM CHLORIDE ER 10 MEQ PO TBCR: 10.0000 meq | EXTENDED_RELEASE_TABLET | Freq: Two times a day (BID) | ORAL | 3 refills | Status: DC | PRN

## 2016-10-11 NOTE — Telephone Encounter (Signed)
Pt wife calling stating pt is having some issues with his breathing He's not sleeping well he is having to go outside to get more air She states he is out there all day and all night on the deck  Pt states it is him being claustrophobic but she thinks it may be related his heart He is swollen all over  she's' been sick so she is not sure how long he's been like that  Please advise.   She states he has about 6 stens already  She is having surgery on Wednesday so she wants to make sure he gets taken care of before then  If he walks from one room to another then he gets SOB not panting

## 2016-10-11 NOTE — Progress Notes (Signed)
Cardiology Office Note  Date:  10/11/2016   ID:  Todd Clay, DOB 06-Oct-1945, MRN [id number]  PCP:  Todd Battiest, NP   Chief Complaint  Patient presents with  . other    Sob and edema. Meds reviewed verbally with pt.    HPI:  Todd Clay is a pleasant 71 year old gentleman with history of  CAD,  bypass surgery December 1997  LIMA to the LAD at [facility],  several catheterizations since that time with stents placed,  stent to the left circumflex and RCA in 2005,  poorly controlled diabetes, with PAD, toe  ostiomyelitis requiring PICC line and long-term antibiotics,  Prior history of orthostasis Carotid u/s 40 to 59% on the left who presents for routine followup of his coronary artery disease in for new leg swelling and shortness of breath  He reports that over the past several weeks he has noticed increasing shortness of breath, leg swelling, weight gain High intake of soda and other beverages including coffee Previously stopped beta blocker and torsemide secondary to orthostasis Previous dietary indiscretion Legs feel tight, abdominal swelling, sleeping outside upright some of the time in the cool air Unable to lay flat Denies any chest pain concerning for angina Weight is up at least 8 pounds possibly more  No recent lab work available for review Previous hemoglobin A1c up to 10  nonsmoker Previous lipid panel,Total chol 140,LDL 74 on simvastatin 80 mg daily  Reports blood pressure well controlled, no orthostasis symptoms On Plavix, stopped aspirin on his own in the past  EKG on today's visit shows normal sinus rhythm with rate 82 bpm, nonspecific T-wave, abn P wave, PVCs in a bigeminal pattern There was change in P wave early 2017 compared to July 2017  Other past medical history reviewed  on a prior office visit,pressure in the 90s, metoprolol was held, torsemide was held with dramatic improvement of his symptoms. He had shaking, freezing, required  assistance at times. Since the medication changes, he reports he is had no further episodes. He did have significant workup in [place] including MRI, EEG in September 2016  Cardiac catheterization in 2008 at [facility] showed 99% proximal RCA disease with endeavor DES placed 2.5 x 15 mm, also with 60% distal RCA disease, 100% OM1 disease followed by 50%, 80% proximal LAD disease, 100% followed by 90% mid LAD disease, 100% diagonal disease Normal ejection fraction at that time estimated greater than 60%  Prior cardiac catheterization in July 2005 details 60% proximal RCA followed by 50%, 90% and 70% distal RCA disease 90% mid left circumflex disease, 90% OM 3 disease, 70% proximal LAD disease extending to the mid vessel, 100% mid LAD disease, 70% distal LAD disease  Cardiac catheterization July 2000 with 70% proximal RCA disease, 90% mid RCA disease, 70% distal RCA disease, 70% proximal left circumflex disease, 70% OM1 disease, 100% OM 2 disease, 70% proximal LAD to mid LAD disease, 70% mid and distal LAD disease, 100% diagonal disease   lab work in the hospital 07/26/2013 shows creatinine 1.43, BUN 21, potassium 4.1 Total cholesterol 130, hemoglobin A1c of 10  PMH:   has a past medical history of CAD (coronary artery disease); Depression; Diabetes mellitus with complication ([place]); Diabetic neuropathy ([place]); History of chickenpox; HLD (hyperlipidemia); HTN (hypertension); MI (myocardial infarction) ([place]); Osteomyelitis ([place]); Proliferative diabetic retinopathy ([place]); PVD (peripheral vascular disease) ([place]); and Retinal detachment.  PSH:    Past Surgical History:  Procedure Laterality Date  . AMPUTATION OF REPLICATED TOES  Left 2nd and 3rd metatarsal  . CARDIAC CATHETERIZATION    . CORONARY ANGIOPLASTY     Patient has a total of 5 stents placed.  . CORONARY ARTERY BYPASS GRAFT  1998   x1 vessel @ [facility]  . RETINAL DETACHMENT SURGERY     Laser  . STENTS     x 5   . VITREOUS RETINAL  SURGERY  1985   Laser tx [facility]    Current Outpatient Prescriptions  Medication Sig Dispense Refill  . blood glucose meter kit and supplies KIT Dispense based on patient and insurance preference. Use up to four times daily as directed. (FOR ICD-9 250.00, 250.01). 1 each 0  . clopidogrel (PLAVIX) 75 MG tablet TAKE ONE TABLET BY MOUTH ONCE DAILY WITH BREAKFAST *CALL OFFICE TO SCHEDULE A 6 MONTH APPOINTMENT FOR FUTURE REFILLS* 90 tablet 3  . gabapentin (NEURONTIN) 300 MG capsule TAKE THREE CAPSULES BY MOUTH THREE TIMES DAILY 270 capsule 1  . glucagon (GLUCAGON EMERGENCY) 1 MG injection Inject 1 mg into the muscle once as needed (in case of severe hypoglycemia). 1 each 12  . insulin regular (NOVOLIN R RELION) 100 units/mL injection Inject 0.06 mLs (6 Units total) into the skin 3 (three) times daily before meals. 10 mL 2  . LANTUS 100 UNIT/ML injection INJECT 18 UNITS INTO THE SKIN DAILY 10 mL 5  . simvastatin (ZOCOR) 80 MG tablet TAKE ONE TABLET BY MOUTH ONCE DAILY AT SIX IN THE EVENING (6PM) 90 tablet 2   No current facility-administered medications for this visit.      Allergies:   Patient has no known allergies.   Social History:  The patient  reports that he has never smoked. He has never used smokeless tobacco. He reports that he does not drink alcohol or use drugs.   Family History:   family history includes Cancer in his brother; Diabetes in his maternal aunt, maternal grandfather, maternal grandmother, and maternal uncle; Heart disease in his father; Kidney disease in his mother.    Review of Systems: Review of Systems  Constitutional: Negative.        Weight gain  Respiratory: Positive for shortness of breath.   Cardiovascular: Positive for leg swelling.  Gastrointestinal: Negative.        Abdominal swelling  Musculoskeletal: Negative.   Neurological: Negative.   Psychiatric/Behavioral: Negative.   All other systems reviewed and are negative.    PHYSICAL EXAM: VS:  BP 114/62  (BP Location: Left Arm, Patient Position: Sitting, Cuff Size: Normal)   Pulse 82   Ht _0  (1.727 m)   Wt 208 lb 12 oz (94.7 kg)   BMI 31.74 kg/m  , BMI Body mass index is 31.74 kg/m. GEN: Well nourished, well developed, in no acute distress  HEENT: normal  Neck: no JVD, carotid bruits, or masses Cardiac: RRR; no murmurs, rubs, or gallops,no edema  Respiratory:  clear to auscultation bilaterally, normal work of breathing GI: soft, nontender, nondistended, + BS MS: no deformity or atrophy  Skin: warm and dry, no rash Neuro:  Strength and sensation are intact Psych: euthymic mood, full affect    Recent Labs: No results found for requested labs within last 8760 hours.    Lipid Panel No results found for: CHOL, HDL, LDLCALC, TRIG    Wt Readings from Last 3 Encounters:  10/11/16 208 lb 12 oz (94.7 kg)  06/20/16 203 lb 4 oz (92.2 kg)  12/23/15 200 lb 4 oz (90.8 kg)  ASSESSMENT AND PLAN:  Acute CHF, unspecified Echocardiogram ordered to evaluate his LV function We have recommended torsemide 20 mg twice a day for 3 days then down to 20 mg daily Take potassium pill with each torsemide Recommended he weighed daily Long discussion concerning his diet, fluid intake, salt intake He drink lots of soda, coffee  Hx of CABG - Plan: EKG 12-Lead Currently with no symptoms of angina. No further workup at this time. Continue current medication regimen. 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 No further workup at  this time. Continue current medication regimen.  Obesity We have encouraged continued exercise, careful diet management in an effort to lose weight.    Total encounter time more than 45 minutes  Greater than 50% was spent in counseling and coordination of care with the patient  Long discussion concerning CHF management, types of CHF Disposition:   F/U  1 month   Orders Placed This Encounter  Procedures  . EKG 12-Lead     Signed, Esmond Plants, M.D., Ph.D. 10/11/2016  [place], [place]

## 2016-10-11 NOTE — Telephone Encounter (Signed)
Spoke w/ pt's wife.  Pt added on to see Dr. Rockey Situ today @ 11:40.

## 2016-10-11 NOTE — Patient Instructions (Addendum)
Medication Instructions:   Please take torsemide twice a day for 3 days Then down to one a day For every torsemide, take one potassium pill  Labwork:  No new labs needed  Testing/Procedures:  We will order a echocardiogram for CHF, SOB Echocardiography is a painless test that uses sound waves to create images of your heart. It provides your doctor with information about the size and shape of your heart and how well your heart's chambers and valves are working. This procedure takes approximately one hour. There are no restrictions for this procedure.  Follow-Up: 1 month.   If you need a refill on your cardiac medications before your next appointment, please call your pharmacy.    Echocardiogram An echocardiogram, or echocardiography, uses sound waves (ultrasound) to produce an image of your heart. The echocardiogram is simple, painless, obtained within a short period of time, and offers valuable information to your health care provider. The images from an echocardiogram can provide information such as:  Evidence of coronary artery disease (CAD).  Heart size.  Heart muscle function.  Heart valve function.  Aneurysm detection.  Evidence of a past heart attack.  Fluid buildup around the heart.  Heart muscle thickening.  Assess heart valve function. Tell a health care provider about:  Any allergies you have.  All medicines you are taking, including vitamins, herbs, eye drops, creams, and over-the-counter medicines.  Any problems you or family members have had with anesthetic medicines.  Any blood disorders you have.  Any surgeries you have had.  Any medical conditions you have.  Whether you are pregnant or may be pregnant. What happens before the procedure? No special preparation is needed. Eat and drink normally. What happens during the procedure?  In order to produce an image of your heart, gel will be applied to your chest and a wand-like tool (transducer)  will be moved over your chest. The gel will help transmit the sound waves from the transducer. The sound waves will harmlessly bounce off your heart to allow the heart images to be captured in real-time motion. These images will then be recorded.  You may need an IV to receive a medicine that improves the quality of the pictures. What happens after the procedure? You may return to your normal schedule including diet, activities, and medicines, unless your health care provider tells you otherwise. This information is not intended to replace advice given to you by your health care provider. Make sure you discuss any questions you have with your health care provider. Document Released: 05/13/2000 Document Revised: 01/02/2016 Document Reviewed: 01/21/2013 Elsevier Interactive Patient Education  2017 [employer].

## 2016-10-17 ENCOUNTER — Telehealth: Payer: Self-pay | Admitting: Cardiovascular Disease

## 2016-10-17 NOTE — Telephone Encounter (Signed)
Received incoming call from patient's wife, ok per DPR. She remembered she did not tell [name] last week that patient, when in his mid 92's to 25's years old, was told he had a large amount of protein in his urine and would be on dialysis by the time he was 85. She wonders if it is a kidney or heart issue. Patient said he "doesn't feel right." He can't really say what is wrong. Patient went to take out the trash yesterday, and could not do it. He said he was too weak and tired. He did say he felt like he was going to "black out." That happened that one time. Symptoms of SOB are the same as reported at [facility] on 10/11/16. Weight has decreased to 199lb today from 208lb on 10/11/16. Wife still feels like he should lose 10 more pounds. [name] advised: "Please take torsemide twice a day for 3 days Then down to one a day For every torsemide, take one potassium pill"  Patient has still been taking torsemide twice a day, advised to decreased to one a day. Blood sugar has been up and down as well. As high as 400 one day. Advised to contact endocrinologist as well. She's concerned that the echo is not until June. Echo rescheduled for this Thursday, 10/20/16.   Will route to [name] for advice.

## 2016-10-17 NOTE — Telephone Encounter (Signed)
Would check blood pressure when he stands up Previously diuretic has to be held for low blood pressure and orthostasis If he is on two torsemide, this may help his breathing and swelling but may drop his pressures causing feeling like he is going to blackout We will check echocardiogram this week

## 2016-10-17 NOTE — Telephone Encounter (Signed)
No answer on home or cell number. Left message to call back on each number.

## 2016-10-17 NOTE — Telephone Encounter (Signed)
Pt wife calling stating she would like a call back. When I asked her for a message she said I just need a call back I then explained to her sometimes giving a message they may need to talk to provider  She was a bit upset but stated below:  Pt is still weak and is still reliving fluid  She would like a urinalysis done on him and or more testing done.  She states it is not related to his heart there is something else wrong with him He is not himself  Asked her if there was anything else I could add and she said no then proceeded to hang up on me  Please call back

## 2016-10-18 NOTE — Telephone Encounter (Signed)
Left message for pt to call back  °

## 2016-10-18 NOTE — Telephone Encounter (Signed)
Spoke w/ pt.  Advised him of Dr. Donivan Scull recommendation. He stopped torsemide yesterday and feels a bit better today. He admits to taking the 2 pills a few days longer than he should have. He will have his wife check his orthostatic vitals tonight when she gets home.  He will keep his appt for ECHO on Thursday and call back if his sx do not continue to improve.  He is appreciative of the call.

## 2016-10-20 ENCOUNTER — Ambulatory Visit (INDEPENDENT_AMBULATORY_CARE_PROVIDER_SITE_OTHER): Payer: PPO

## 2016-10-20 ENCOUNTER — Other Ambulatory Visit: Payer: Self-pay

## 2016-10-20 DIAGNOSIS — R0602 Shortness of breath: Secondary | ICD-10-CM

## 2016-10-20 NOTE — Telephone Encounter (Signed)
Patient wife wants to know if Dr. Rockey Situ is willing to order UA for blood sugar issues see below.

## 2016-10-21 NOTE — Telephone Encounter (Signed)
Spoke with patients wife per release form and let her know that she should have him check with his PCP for a UA because we don't typically order those. She verbalized understanding and was agreeable to call them for further advice. She did want to know if echo results were available and let her know that they are pending review by Dr. Rockey Situ but that once he reads the test then someone would call with results. She requested that we call her work number [phone] because their home phone has not been working properly. Let her know that I would make sure to note this. She was appreciative for the call and had no further questions at this time.

## 2016-10-27 ENCOUNTER — Telehealth: Payer: Self-pay | Admitting: Cardiovascular Disease

## 2016-10-27 NOTE — Telephone Encounter (Signed)
Received paper with vital signs dated 10/20/16 afternoon. Original paper placed in "Save" bin on Iran Rowe's desk.   Lying 126/70 Sitting 103/58 Standing 104/58

## 2016-11-14 ENCOUNTER — Other Ambulatory Visit: Payer: PPO

## 2016-11-14 DIAGNOSIS — E1065 Type 1 diabetes mellitus with hyperglycemia: Secondary | ICD-10-CM | POA: Diagnosis not present

## 2016-11-14 DIAGNOSIS — I499 Cardiac arrhythmia, unspecified: Secondary | ICD-10-CM | POA: Diagnosis not present

## 2016-11-14 DIAGNOSIS — R600 Localized edema: Secondary | ICD-10-CM | POA: Diagnosis not present

## 2016-11-21 ENCOUNTER — Other Ambulatory Visit: Payer: Self-pay | Admitting: Family Medicine

## 2016-11-25 NOTE — Progress Notes (Signed)
Cardiology Office Note  Date:  11/28/2016   ID:  Todd Clay, DOB 1946/02/12, MRN [id number]  PCP:  Rubbie Battiest, NP   Chief Complaint  Patient presents with  . other    1 month follow up. Patient denies chest pain and SOB. Meds reviewed verbally with patient.     HPI:  Todd Clay is a pleasant 71 year old gentleman with history of  CAD,  CABG December 1997  LIMA to the LAD at [facility],  several catheterizations since that time with stents placed,  stent to the left circumflex and RCA in 2005,  poorly controlled diabetes, with PAD,HBA1C 10 toe  ostiomyelitis requiring PICC line and long-term antibiotics,  Prior history of orthostasis Carotid u/s 40 to 59% on the left Chronic Diastolic CHF Sedentary lifestyle PVCs and bigeminal pattern in the past 2 office visits who presents for routine followup of his coronary artery disease, leg swelling and shortness of breath  Started on torsemide on previous clinic visit for lower extremity edema, weight gain, shortness of breath .  Echocardiogram showing preserved LV function, diastolic relaxation abnormality  Drinks a lot of coke zero/pepsi Dietary indiscretion sedentary lifestyle, wife reports that he sleeps most of the day, sits and watches TV  Reports that his shortness of breath has improved Wife concerned about low blood pressures when he stands up, recurrent orthostasis She does not have documentation of this, only that he is staggering sometimes when he stands up He does not seem particularly bothered by this, no recent falls  nonsmoker Previous lipid panel,Total chol 140,LDL 74 on simvastatin 80 mg daily  On Plavix, stopped aspirin on his own in the past  He declined EKG on today's visit   There was change in P wave early 2017 compared to July 2017  Other past medical history reviewed  on a prior office visit,pressure in the 90s, metoprolol was held, torsemide was held with dramatic improvement of his  symptoms. He had shaking, freezing, required assistance at times. Since the medication changes, he reports he is had no further episodes. He did have significant workup in [place] including MRI, EEG in September 2016  Cardiac catheterization in 2008 at [facility] showed 99% proximal RCA disease with endeavor DES placed 2.5 x 15 mm, also with 60% distal RCA disease, 100% OM1 disease followed by 50%, 80% proximal LAD disease, 100% followed by 90% mid LAD disease, 100% diagonal disease Normal ejection fraction at that time estimated greater than 60%  Prior cardiac catheterization in July 2005 details 60% proximal RCA followed by 50%, 90% and 70% distal RCA disease 90% mid left circumflex disease, 90% OM 3 disease, 70% proximal LAD disease extending to the mid vessel, 100% mid LAD disease, 70% distal LAD disease  Cardiac catheterization July 2000 with 70% proximal RCA disease, 90% mid RCA disease, 70% distal RCA disease, 70% proximal left circumflex disease, 70% OM1 disease, 100% OM 2 disease, 70% proximal LAD to mid LAD disease, 70% mid and distal LAD disease, 100% diagonal disease   lab work in the hospital 07/26/2013 shows creatinine 1.43, BUN 21, potassium 4.1 Total cholesterol 130, hemoglobin A1c of 10  PMH:   has a past medical history of CAD (coronary artery disease); Depression; Diabetes mellitus with complication ([place]); Diabetic neuropathy ([place]); History of chickenpox; HLD (hyperlipidemia); HTN (hypertension); MI (myocardial infarction) ([place]); Osteomyelitis ([place]); Proliferative diabetic retinopathy ([place]); PVD (peripheral vascular disease) ([place]); and Retinal detachment.  PSH:    Past Surgical History:  Procedure Laterality Date  .  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 CARDIAC CATHETERIZATION    . CORONARY ANGIOPLASTY     Patient has a total of 5 stents placed.  . CORONARY ARTERY BYPASS GRAFT  1998   x1 vessel @ [facility]  . RETINAL DETACHMENT SURGERY     Laser   . STENTS     x 5   . VITREOUS RETINAL SURGERY  1985   Laser tx [facility]    Current Outpatient Prescriptions  Medication Sig Dispense Refill  . blood glucose meter kit and supplies KIT Dispense based on patient and insurance preference. Use up to four times daily as directed. (FOR ICD-9 250.00, 250.01). 1 each 0  . clopidogrel (PLAVIX) 75 MG tablet TAKE ONE TABLET BY MOUTH ONCE DAILY WITH BREAKFAST *CALL OFFICE TO SCHEDULE A 6 MONTH APPOINTMENT FOR FUTURE REFILLS* 90 tablet 3  . gabapentin (NEURONTIN) 300 MG capsule TAKE THREE CAPSULES BY MOUTH THREE TIMES DAILY 270 capsule 1  . glucagon (GLUCAGON EMERGENCY) 1 MG injection Inject 1 mg into the muscle once as needed (in case of severe hypoglycemia). 1 each 12  . insulin regular (NOVOLIN R RELION) 100 units/mL injection Inject 0.06 mLs (6 Units total) into the skin 3 (three) times daily before meals. 10 mL 2  . LANTUS 100 UNIT/ML injection INJECT 18 UNITS INTO THE SKIN DAILY 10 mL 5  . potassium chloride (K-DUR) 10 MEQ tablet Take 1 tablet (10 mEq total) by mouth 2 (two) times daily as needed (take with torsemide). 90 tablet 3  . simvastatin (ZOCOR) 80 MG tablet TAKE ONE TABLET BY MOUTH ONCE DAILY AT SIX IN THE EVENING (6PM) 90 tablet 2  . torsemide (DEMADEX) 20 MG tablet Take 1 tablet (20 mg total) by mouth 2 (two) times daily as needed. 90 tablet 3   No current facility-administered medications for this visit.      Allergies:   Patient has no known allergies.   Social History:  The patient  reports that he has never smoked. He has never used smokeless tobacco. He reports that he does not drink alcohol or use drugs.   Family History:   family history includes Cancer in his brother; Diabetes in his maternal aunt, maternal grandfather, maternal grandmother, and maternal uncle; Heart disease in his father; Kidney disease in his mother.    Review of Systems: Review of Systems  Constitutional: Negative.        Weight gain  Respiratory:  Negative.   Cardiovascular: Positive for leg swelling.  Gastrointestinal: Negative.   Musculoskeletal: Negative.   Neurological: Negative.   Psychiatric/Behavioral: Negative.   All other systems reviewed and are negative.    PHYSICAL EXAM: VS:  BP 106/60 (BP Location: Left Arm, Patient Position: Sitting, Cuff Size: Normal)   Pulse 68   Ht 5' 8"  (1.727 m)   Wt 202 lb 12 oz (92 kg)   BMI 30.83 kg/m  , BMI Body mass index is 30.83 kg/m. GEN: Well nourished, well developed, in no acute distress  HEENT: normal  Neck: no JVD, carotid bruits, or masses Cardiac: RRR; no murmurs, rubs, or gallops, trace lower extremity  edema  Respiratory:  clear to auscultation bilaterally, normal work of breathing GI: soft, nontender, nondistended, + BS MS: no deformity or atrophy  Skin: warm and dry, no rash Neuro:  Strength and sensation are intact Psych: euthymic mood, full affect    Recent Labs: No results found for requested labs within last 8760  hours.    Lipid Panel No results found for: CHOL, HDL, LDLCALC, TRIG    Wt Readings from Last 3 Encounters:  11/28/16 202 lb 12 oz (92 kg)  10/11/16 208 lb 12 oz (94.7 kg)  06/20/16 203 lb 4 oz (92.2 kg)       ASSESSMENT AND PLAN:  Acute CHF, diastolic Normal LV function, grade 2 diastolic dysfunction  Recommended he stay on torsemide 20 mg twice a day given his high fluid intake  We have recommended torsemide 20 mg twice a day for 3 days then down to 20 mg daily Long discussion concerning his diet, fluid intake, salt intake He drink lots of soda, coffee Blood pressure low, unable to advance his other medications weight is down 6 pounds  Hx of CABG - Plan: EKG 12-Lead Currently with no symptoms of angina. No further workup at this time. Continue current medication regimen. 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 Needs to start walking program but he has no desire, sleeping most of the day while his wife is working    gait instability   unclear if this is secondary to sedentary lifestyle, leg weakness Possible orthostasis We did offer midodrine for hypotension as needed. Wife has declined at this time  Frequent PVCs On EKG in bigeminal pattern on today's visit as well as last clinic visit  Wife has declined antiarrhythmic medication We would likely use low-dose amiodarone given underlying coronary disease Stress test ordered to rule out ischemia     Total encounter time more than 45 minutes  Greater than 50% was spent in counseling and coordination of care with the patient  Long discussion concerning CHF management, types of CHF, PVCs/arrhythmia, leg weakness Disposition:   F/U  6 month   No orders of the defined types were placed in this encounter.    Signed, Esmond Plants, M.D., Ph.D. 11/28/2016  [place], [place]

## 2016-11-28 ENCOUNTER — Encounter: Payer: Self-pay | Admitting: Cardiovascular Disease

## 2016-11-28 ENCOUNTER — Ambulatory Visit (INDEPENDENT_AMBULATORY_CARE_PROVIDER_SITE_OTHER): Payer: PPO | Admitting: Cardiovascular Disease

## 2016-11-28 VITALS — BP 106/60 | HR 68 | Ht 68.0 in | Wt 202.8 lb

## 2016-11-28 DIAGNOSIS — Z951 Presence of aortocoronary bypass graft: Secondary | ICD-10-CM | POA: Diagnosis not present

## 2016-11-28 DIAGNOSIS — I5031 Acute diastolic (congestive) heart failure: Secondary | ICD-10-CM | POA: Diagnosis not present

## 2016-11-28 DIAGNOSIS — E1059 Type 1 diabetes mellitus with other circulatory complications: Secondary | ICD-10-CM

## 2016-11-28 DIAGNOSIS — E1065 Type 1 diabetes mellitus with hyperglycemia: Secondary | ICD-10-CM

## 2016-11-28 DIAGNOSIS — E782 Mixed hyperlipidemia: Secondary | ICD-10-CM | POA: Diagnosis not present

## 2016-11-28 DIAGNOSIS — I25118 Atherosclerotic heart disease of native coronary artery with other forms of angina pectoris: Secondary | ICD-10-CM | POA: Diagnosis not present

## 2016-11-28 DIAGNOSIS — IMO0002 Reserved for concepts with insufficient information to code with codable children: Secondary | ICD-10-CM

## 2016-11-28 NOTE — Patient Instructions (Addendum)
Medication Instructions:   Please monitor your fluid intake, not too  Much  Call the office if you need a pill that pushes blood pressure up  Please call if you would like an arrhythmia pill for the PVCs  Labwork:  No new labs needed  Testing/Procedures:  We will order a lexiscan myoview for known CAD, arrhythmia [place]  Your caregiver has ordered a Stress Test with nuclear imaging. The purpose of this test is to evaluate the blood supply to your heart muscle. This procedure is referred to as a "Non-Invasive Stress Test." This is because other than having an IV started in your vein, nothing is inserted or "invades" your body. Cardiac stress tests are done to find areas of poor blood flow to the heart by determining the extent of coronary artery disease (CAD). Some patients exercise on a treadmill, which naturally increases the blood flow to your heart, while others who are  unable to walk on a treadmill due to physical limitations have a pharmacologic/chemical stress agent called Lexiscan . This medicine will mimic walking on a treadmill by temporarily increasing your coronary blood flow.   Please note: these test may take anywhere between 2-4 hours to complete  PLEASE REPORT TO [facility] AT THE FIRST DESK WILL DIRECT YOU WHERE TO GO  Date of Procedure:___Thursday, July 5_____  Arrival Time for Procedure:___7:15 am___  Instructions regarding medication:   __X__ : Hold diabetes medication morning of procedure   How to prepare for your Myoview test:  1. Do not eat or drink after midnight 2. No caffeine for 24 hours prior to test 3. No smoking 24 hours prior to test. 4. Your medication may be taken with water.  If your doctor stopped a medication because of this test, do not take that medication. 5. Please wear a short sleeve shirt. 6. No perfume, cologne or lotion.  Follow-Up: It was a pleasure seeing you in the office today. Please  call us if you have new issues that need to be addressed before your next appt.  [phone]  Your physician wants you to follow-up in: 6 months.  You will receive a reminder letter in the mail two months in advance. If you don't receive a letter, please call our office to schedule the follow-up appointment.  If you need a refill on your cardiac medications before your next appointment, please call your pharmacy.    Cardiac Nuclear Scan A cardiac nuclear scan is a test that measures blood flow to the heart when a person is resting and when he or she is exercising. The test looks for problems such as:  Not enough blood reaching a portion of the heart.  The heart muscle not working normally.  You may need this test if:  You have heart disease.  You have had abnormal lab results.  You have had heart surgery or angioplasty.  You have chest pain.  You have shortness of breath.  In this test, a radioactive dye (tracer) is injected into your bloodstream. After the tracer has traveled to your heart, an imaging device is used to measure how much of the tracer is absorbed by or distributed to various areas of your heart. This procedure is usually done at a hospital and takes 2-4 hours. Tell a health care provider about:  Any allergies you have.  All medicines you are taking, including vitamins, herbs, eye drops, creams, and over-the-counter medicines.  Any problems you or family members have  had with the use of anesthetic medicines.  Any blood disorders you have.  Any surgeries you have had.  Any medical conditions you have.  Whether you are pregnant or may be pregnant. What are the risks? Generally, this is a safe procedure. However, problems may occur, including:  Serious chest pain and heart attack. This is only a risk if the stress portion of the test is done.  Rapid heartbeat.  Sensation of warmth in your chest. This usually passes quickly.  What happens before the  procedure?  Ask your health care provider about changing or stopping your regular medicines. This is especially important if you are taking diabetes medicines or blood thinners.  Remove your jewelry on the day of the procedure. What happens during the procedure?  An IV tube will be inserted into one of your veins.  Your health care provider will inject a small amount of radioactive tracer through the tube.  You will wait for 20-40 minutes while the tracer travels through your bloodstream.  Your heart activity will be monitored with an electrocardiogram (ECG).  You will lie down on an exam table.  Images of your heart will be taken for about 15-20 minutes.  You may be asked to exercise on a treadmill or stationary bike. While you exercise, your heart's activity will be monitored with an ECG, and your blood pressure will be checked. If you are unable to exercise, you may be given a medicine to increase blood flow to parts of your heart.  When blood flow to your heart has peaked, a tracer will again be injected through the IV tube.  After 20-40 minutes, you will get back on the exam table and have more images taken of your heart.  When the procedure is over, your IV tube will be removed. The procedure may vary among health care providers and hospitals. Depending on the type of tracer used, scans may need to be repeated 3-4 hours later. What happens after the procedure?  Unless your health care provider tells you otherwise, you may return to your normal schedule, including diet, activities, and medicines.  Unless your health care provider tells you otherwise, you may increase your fluid intake. This will help flush the contrast dye from your body. Drink enough fluid to keep your urine clear or pale yellow.  It is up to you to get your test results. Ask your health care provider, or the department that is doing the test, when your results will be ready. Summary  A cardiac nuclear scan  measures the blood flow to the heart when a person is resting and when he or she is exercising.  You may need this test if you are at risk for heart disease.  Tell your health care provider if you are pregnant.  Unless your health care provider tells you otherwise, increase your fluid intake. This will help flush the contrast dye from your body. Drink enough fluid to keep your urine clear or pale yellow. This information is not intended to replace advice given to you by your health care provider. Make sure you discuss any questions you have with your health care provider. Document Released: 06/10/2004 Document Revised: 05/18/2016 Document Reviewed: 04/24/2013 Elsevier Interactive Patient Education  2017 [employer].

## 2016-12-01 ENCOUNTER — Encounter
Admission: RE | Admit: 2016-12-01 | Discharge: 2016-12-01 | Disposition: A | Discharge status: Home / Self Care | Payer: PPO | Source: Ambulatory Visit | Attending: Cardiovascular Disease | Admitting: Cardiovascular Disease

## 2016-12-01 DIAGNOSIS — I25118 Atherosclerotic heart disease of native coronary artery with other forms of angina pectoris: Secondary | ICD-10-CM | POA: Diagnosis not present

## 2016-12-01 DIAGNOSIS — E1059 Type 1 diabetes mellitus with other circulatory complications: Secondary | ICD-10-CM

## 2016-12-01 DIAGNOSIS — E782 Mixed hyperlipidemia: Secondary | ICD-10-CM

## 2016-12-01 DIAGNOSIS — E1065 Type 1 diabetes mellitus with hyperglycemia: Secondary | ICD-10-CM

## 2016-12-01 DIAGNOSIS — Z951 Presence of aortocoronary bypass graft: Secondary | ICD-10-CM

## 2016-12-01 DIAGNOSIS — I5031 Acute diastolic (congestive) heart failure: Secondary | ICD-10-CM

## 2016-12-01 DIAGNOSIS — IMO0002 Reserved for concepts with insufficient information to code with codable children: Secondary | ICD-10-CM

## 2016-12-01 MED ORDER — TECHNETIUM TC 99M TETROFOSMIN IV KIT
32.2890 | PACK | Freq: Once | INTRAVENOUS | Status: AC | PRN
  Administered 2016-12-01: 32.289 via INTRAVENOUS

## 2016-12-01 MED ORDER — REGADENOSON 0.4 MG/5ML IV SOLN
0.4000 mg | Freq: Once | INTRAVENOUS | Status: AC
  Administered 2016-12-01: 0.4 mg via INTRAVENOUS
  Filled 2016-12-01: qty 5

## 2016-12-01 MED ORDER — TECHNETIUM TC 99M TETROFOSMIN IV KIT
13.0000 | PACK | Freq: Once | INTRAVENOUS | Status: AC | PRN
  Administered 2016-12-01: 12.435 via INTRAVENOUS

## 2016-12-05 ENCOUNTER — Telehealth: Payer: Self-pay | Admitting: Cardiovascular Disease

## 2016-12-05 LAB — NM MYOCAR MULTI W/SPECT W/WALL MOTION / EF
CHL CUP RESTING HR STRESS: 73 {beats}/min
CSEPEDS: 0 s
Estimated workload: 1 METS
Exercise duration (min): 0 min
LVDIAVOL: 103 mL (ref 62–150)
LVSYSVOL: 47 mL
MPHR: 150 {beats}/min
NUC STRESS TID: 1.1
Peak HR: 81 {beats}/min
Percent HR: 54 %
SDS: 1
SRS: 15
SSS: 20

## 2016-12-05 NOTE — Telephone Encounter (Signed)
Pt wife calling asking about patient myoview results  Please call as soon as they are done

## 2016-12-05 NOTE — Telephone Encounter (Signed)
Stress test dictation came through No significant ischemia Some mild artifact with taking pictures with his arms down as he was unable to put arms above his head but otherwise no suggestion of significant blockage noted

## 2016-12-06 NOTE — Telephone Encounter (Signed)
Results called to patient's wife, ok per dpr. She verbalized understanding and no further questions at this time.

## 2016-12-12 ENCOUNTER — Other Ambulatory Visit: Payer: Self-pay | Admitting: Family Medicine

## 2016-12-13 NOTE — Telephone Encounter (Signed)
Last visit with Lorane Gell NP last OV 3/17 please advise to refill?

## 2016-12-24 ENCOUNTER — Other Ambulatory Visit: Payer: Self-pay | Admitting: Family Medicine

## 2017-03-06 DIAGNOSIS — E785 Hyperlipidemia, unspecified: Secondary | ICD-10-CM

## 2017-03-06 DIAGNOSIS — E1042 Type 1 diabetes mellitus with diabetic polyneuropathy: Secondary | ICD-10-CM | POA: Diagnosis not present

## 2017-03-06 DIAGNOSIS — E1159 Type 2 diabetes mellitus with other circulatory complications: Secondary | ICD-10-CM | POA: Insufficient documentation

## 2017-03-06 DIAGNOSIS — I10 Essential (primary) hypertension: Secondary | ICD-10-CM

## 2017-03-06 DIAGNOSIS — E1069 Type 1 diabetes mellitus with other specified complication: Secondary | ICD-10-CM | POA: Insufficient documentation

## 2017-03-09 ENCOUNTER — Other Ambulatory Visit: Payer: Self-pay | Admitting: Cardiovascular Disease

## 2017-03-16 DIAGNOSIS — Z23 Encounter for immunization: Secondary | ICD-10-CM | POA: Diagnosis not present

## 2017-06-08 DIAGNOSIS — I10 Essential (primary) hypertension: Secondary | ICD-10-CM | POA: Diagnosis not present

## 2017-06-08 DIAGNOSIS — E1042 Type 1 diabetes mellitus with diabetic polyneuropathy: Secondary | ICD-10-CM | POA: Diagnosis not present

## 2017-06-08 DIAGNOSIS — E1069 Type 1 diabetes mellitus with other specified complication: Secondary | ICD-10-CM | POA: Diagnosis not present

## 2017-06-08 DIAGNOSIS — E1159 Type 2 diabetes mellitus with other circulatory complications: Secondary | ICD-10-CM | POA: Diagnosis not present

## 2017-06-08 DIAGNOSIS — E785 Hyperlipidemia, unspecified: Secondary | ICD-10-CM | POA: Diagnosis not present

## 2017-09-14 DIAGNOSIS — E1042 Type 1 diabetes mellitus with diabetic polyneuropathy: Secondary | ICD-10-CM | POA: Diagnosis not present

## 2017-09-14 DIAGNOSIS — E785 Hyperlipidemia, unspecified: Secondary | ICD-10-CM | POA: Diagnosis not present

## 2017-09-14 DIAGNOSIS — R5383 Other fatigue: Secondary | ICD-10-CM | POA: Diagnosis not present

## 2017-09-14 DIAGNOSIS — E1159 Type 2 diabetes mellitus with other circulatory complications: Secondary | ICD-10-CM | POA: Diagnosis not present

## 2017-09-14 DIAGNOSIS — I10 Essential (primary) hypertension: Secondary | ICD-10-CM | POA: Diagnosis not present

## 2017-09-14 DIAGNOSIS — E1069 Type 1 diabetes mellitus with other specified complication: Secondary | ICD-10-CM | POA: Diagnosis not present

## 2017-09-18 DIAGNOSIS — R5383 Other fatigue: Secondary | ICD-10-CM | POA: Diagnosis not present

## 2017-10-04 DIAGNOSIS — I10 Essential (primary) hypertension: Secondary | ICD-10-CM | POA: Diagnosis not present

## 2017-10-04 DIAGNOSIS — E1159 Type 2 diabetes mellitus with other circulatory complications: Secondary | ICD-10-CM | POA: Diagnosis not present

## 2017-11-08 DIAGNOSIS — E103553 Type 1 diabetes mellitus with stable proliferative diabetic retinopathy, bilateral: Secondary | ICD-10-CM | POA: Diagnosis not present

## 2017-11-08 DIAGNOSIS — H3321 Serous retinal detachment, right eye: Secondary | ICD-10-CM | POA: Insufficient documentation

## 2017-11-08 DIAGNOSIS — H5213 Myopia, bilateral: Secondary | ICD-10-CM | POA: Insufficient documentation

## 2017-11-08 DIAGNOSIS — H35373 Puckering of macula, bilateral: Secondary | ICD-10-CM | POA: Diagnosis not present

## 2017-11-08 DIAGNOSIS — Z961 Presence of intraocular lens: Secondary | ICD-10-CM | POA: Diagnosis not present

## 2017-11-08 DIAGNOSIS — H52203 Unspecified astigmatism, bilateral: Secondary | ICD-10-CM

## 2017-11-08 DIAGNOSIS — H524 Presbyopia: Secondary | ICD-10-CM

## 2017-11-13 ENCOUNTER — Other Ambulatory Visit: Payer: Self-pay | Admitting: Family Medicine

## 2017-11-14 DIAGNOSIS — Z961 Presence of intraocular lens: Secondary | ICD-10-CM | POA: Diagnosis not present

## 2017-11-14 DIAGNOSIS — H35372 Puckering of macula, left eye: Secondary | ICD-10-CM | POA: Diagnosis not present

## 2017-11-14 DIAGNOSIS — E103593 Type 1 diabetes mellitus with proliferative diabetic retinopathy without macular edema, bilateral: Secondary | ICD-10-CM | POA: Diagnosis not present

## 2017-11-14 DIAGNOSIS — H3321 Serous retinal detachment, right eye: Secondary | ICD-10-CM | POA: Diagnosis not present

## 2017-11-14 DIAGNOSIS — H43813 Vitreous degeneration, bilateral: Secondary | ICD-10-CM | POA: Diagnosis not present

## 2017-12-21 DIAGNOSIS — E1042 Type 1 diabetes mellitus with diabetic polyneuropathy: Secondary | ICD-10-CM | POA: Diagnosis not present

## 2017-12-21 DIAGNOSIS — E785 Hyperlipidemia, unspecified: Secondary | ICD-10-CM | POA: Diagnosis not present

## 2017-12-21 DIAGNOSIS — E1159 Type 2 diabetes mellitus with other circulatory complications: Secondary | ICD-10-CM | POA: Diagnosis not present

## 2017-12-21 DIAGNOSIS — E1069 Type 1 diabetes mellitus with other specified complication: Secondary | ICD-10-CM | POA: Diagnosis not present

## 2017-12-21 DIAGNOSIS — I10 Essential (primary) hypertension: Secondary | ICD-10-CM | POA: Diagnosis not present

## 2018-02-15 NOTE — Progress Notes (Signed)
Cardiology Office Note  Date:  02/16/2018   ID:  Todd Clay, DOB 1946-01-28, MRN [id number]  PCP:  Todd Farber, MD   Chief Complaint  Patient presents with  . other    OD 6 month f/u c/o dizziness. Meds reviewed verbally with pt.    HPI:  Todd Clay is a pleasant 72 year old gentleman with history of  CAD,  CABG December 1997  LIMA to the LAD at [facility],  several catheterizations since that time with stents placed,  stent to the left circumflex and RCA in 2005,  poorly controlled diabetes, with PAD,HBA1C 10 toe  ostiomyelitis requiring PICC line and long-term antibiotics,  Prior history of orthostasis Carotid u/s 40 to 59% on the left Chronic Diastolic CHF Sedentary lifestyle PVCs and bigeminal pattern in the past 2 office visits who presents for routine followup of his coronary artery disease, leg swelling and shortness of breath  Problems with orthostasis, Worse with lisinopril  Wife very concerned about One episodes of "being in a trance for 2 min" Was out at a restaurant Finally seem to come out of it without any problems and carried on a conversation She reported his behavior was most bizarre like he was having a stroke but no neurological issues  Sleeps most of the day " 23 hours a day" No energy to do anything during the daytime She reports that she is doing everything Does not sleep well, snoring Previously diagnosed with sleep apnea Previously on oxygen at nighttime for sleep.  He does report having sleep study here through [facility] He got sick of it all and sent the  oxygen back  Drinks a lot of coke zero/pepsi Dietary indiscretion sedentary lifestyle, wife reports that he sleeps most of the day, sits and watches TV  Lab work reviewed HBA1C 7.4 Total chol 120, LDL 58 tsh 2.1  EKG personally reviewed by myself on todays visit Shows normal sinus rhythm with rate 83 bpm PVCs Inverted P waves change in P wave  early 2017 compared to July 2017  Past medical history reviewed Echocardiogram  showing preserved LV function, diastolic relaxation abnormality  nonsmoker Previous lipid panel,Total chol 140,LDL 74 on simvastatin 80 mg daily  On Plavix, stopped aspirin on his own in the past  on a prior office visit,pressure in the 90s, metoprolol was held, torsemide was held with dramatic improvement of his symptoms. He had shaking, freezing, required assistance at times. Since the medication changes, he reports he is had no further episodes. He did have significant workup in [place] including MRI, EEG in September 2016  Cardiac catheterization in 2008 at [facility] showed 99% proximal RCA disease with endeavor DES placed 2.5 x 15 mm, also with 60% distal RCA disease, 100% OM1 disease followed by 50%, 80% proximal LAD disease, 100% followed by 90% mid LAD disease, 100% diagonal disease Normal ejection fraction at that time estimated greater than 60%  Prior cardiac catheterization in July 2005 details 60% proximal RCA followed by 50%, 90% and 70% distal RCA disease 90% mid left circumflex disease, 90% OM 3 disease, 70% proximal LAD disease extending to the mid vessel, 100% mid LAD disease, 70% distal LAD disease  Cardiac catheterization July 2000 with 70% proximal RCA disease, 90% mid RCA disease, 70% distal RCA disease, 70% proximal left circumflex disease, 70% OM1 disease, 100% OM 2 disease, 70% proximal LAD to mid LAD disease, 70% mid and distal LAD disease, 100% diagonal disease   lab work in the  hospital 07/26/2013 shows creatinine 1.43, BUN 21, potassium 4.1 Total cholesterol 130, hemoglobin A1c of 10  PMH:   has a past medical history of CAD (coronary artery disease), Depression, Diabetes mellitus with complication ([place]), Diabetic neuropathy ([place]), History of chickenpox, HLD (hyperlipidemia), HTN (hypertension), MI (myocardial infarction) ([place]), Osteomyelitis ([place]), 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  PSH:    Past Surgical History:  Procedure Laterality Date  . 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 CARDIAC CATHETERIZATION    . CORONARY ANGIOPLASTY     Patient has a total of 5 stents placed.  . CORONARY ARTERY BYPASS GRAFT  1998   x1 vessel @ [facility]  . RETINAL DETACHMENT SURGERY     Laser  . STENTS     x 5   . VITREOUS RETINAL SURGERY  1985   Laser tx [facility]    Current Outpatient Medications  Medication Sig Dispense Refill  . blood glucose meter kit and supplies KIT Dispense based on patient and insurance preference. Use up to four times daily as directed. (FOR ICD-9 250.00, 250.01). 1 each 0  . clopidogrel (PLAVIX) 75 MG tablet Take 1 tablet (75 mg total) by mouth daily. 90 tablet 3  . gabapentin (NEURONTIN) 300 MG capsule TAKE THREE CAPSULES BY MOUTH THREE TIMES DAILY 270 capsule 1  . insulin regular (NOVOLIN R RELION) 100 units/mL injection Inject 0.06 mLs (6 Units total) into the skin 3 (three) times daily before meals. 10 mL 2  . lisinopril (PRINIVIL,ZESTRIL) 5 MG tablet Take by mouth daily.    . simvastatin (ZOCOR) 80 MG tablet TAKE ONE TABLET BY MOUTH IN THE EVENING AT 6 PM 90 tablet 2  . TRESIBA FLEXTOUCH 100 UNIT/ML SOPN FlexTouch Pen Inject 16 Units into the skin at bedtime.   3   No current facility-administered medications for this visit.      Allergies:   Patient has no known allergies.   Social History:  The patient  reports that he has never smoked. He has never used smokeless tobacco. He reports that he does not drink alcohol or use drugs.   Family History:   family history includes Cancer in his brother; Diabetes in his maternal aunt, maternal grandfather, maternal grandmother, and maternal uncle; Heart disease in his father; Kidney disease in his mother.    Review of Systems: Review of Systems  Constitutional: Negative.        Very sleepy   Respiratory: Negative.   Cardiovascular: Negative.   Gastrointestinal: Negative.   Musculoskeletal: Negative.   Neurological: Negative.   Psychiatric/Behavioral: Negative.   All other systems reviewed and are negative.    PHYSICAL EXAM: VS:  BP (!) 117/56 (BP Location: Right Arm, Patient Position: Sitting, Cuff Size: Normal)   Pulse 83   Ht 5' 8"  (1.727 m)   Wt 200 lb (90.7 kg)   BMI 30.41 kg/m  , BMI Body mass index is 30.41 kg/m.  Communicative but looking lethargic and sleepy Constitutional:  oriented to person, place, and time. No distress.  HENT:  Head: Normocephalic and atraumatic.  Eyes:  no discharge. No scleral icterus.  Neck: Normal range of motion. Neck supple. No JVD present.  Cardiovascular: Normal rate, regular rhythm, normal heart sounds and intact distal pulses. Exam reveals no gallop and no friction rub. No edema No murmur heard. Pulmonary/Chest: Effort normal and breath sounds normal. No stridor. No respiratory distress.  no wheezes.  no rales.  no tenderness.  Abdominal: Soft.  no distension.  no tenderness.  Musculoskeletal: Normal range of motion.  no  tenderness or deformity.  Neurological:  normal muscle tone. Coordination normal. No atrophy Skin: Skin is warm and dry. No rash noted. not diaphoretic.  Psychiatric:  normal mood and affect. behavior is normal. Thought content normal.    Recent Labs: No results found for requested labs within last 8760 hours.    Lipid Panel No results found for: CHOL, HDL, LDLCALC, TRIG    Wt Readings from Last 3 Encounters:  02/16/18 200 lb (90.7 kg)  11/28/16 202 lb 12 oz (92 kg)  10/11/16 208 lb 12 oz (94.7 kg)     ASSESSMENT AND PLAN:  Sleep disorder Referral placed to pulmonary Unable to exclude underlying neurologic issue  Chronic CHF, diastolic Appears relatively euvolemic No changes to his medications  Hx of CABG - Plan: EKG 12-Lead Currently with no symptoms of angina. No further workup at this  time. Continue current medication regimen. 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) Working with endocrine Poor diet, sedentary  Dietary guide provided  Obesity We have encouraged continued exercise, careful diet management in an effort to lose weight. Needs to start walking program but he has no desire, sleeping most of the day while his wife is working  Most of his visit today was again spent discussing his status lifestyle    gait instability  sedentary lifestyle, leg weakness Poor sleep hygiene Needs referral to pulmonary for sleep evaluation  PVCs Asymptomatic No further work-up needed    Total encounter time more than 45 minutes  Greater than 50% was spent in counseling and coordination of care with the patient  Disposition:   F/U 12 months   Orders Placed This Encounter  Procedures  . EKG 12-Lead     Signed, Esmond Plants, M.D., Ph.D. 02/16/2018  [place], [place]

## 2018-02-16 ENCOUNTER — Ambulatory Visit: Payer: PPO | Admitting: Cardiovascular Disease

## 2018-02-16 ENCOUNTER — Encounter: Payer: Self-pay | Admitting: Cardiovascular Disease

## 2018-02-16 VITALS — BP 117/56 | HR 83 | Ht 68.0 in | Wt 200.0 lb

## 2018-02-16 DIAGNOSIS — G4733 Obstructive sleep apnea (adult) (pediatric): Secondary | ICD-10-CM

## 2018-02-16 DIAGNOSIS — R0602 Shortness of breath: Secondary | ICD-10-CM

## 2018-02-16 DIAGNOSIS — Z951 Presence of aortocoronary bypass graft: Secondary | ICD-10-CM | POA: Diagnosis not present

## 2018-02-16 DIAGNOSIS — E782 Mixed hyperlipidemia: Secondary | ICD-10-CM

## 2018-02-16 DIAGNOSIS — I25118 Atherosclerotic heart disease of native coronary artery with other forms of angina pectoris: Secondary | ICD-10-CM | POA: Diagnosis not present

## 2018-02-16 DIAGNOSIS — I5031 Acute diastolic (congestive) heart failure: Secondary | ICD-10-CM | POA: Diagnosis not present

## 2018-02-16 DIAGNOSIS — I951 Orthostatic hypotension: Secondary | ICD-10-CM

## 2018-02-16 DIAGNOSIS — I739 Peripheral vascular disease, unspecified: Secondary | ICD-10-CM | POA: Diagnosis not present

## 2018-02-16 DIAGNOSIS — I6529 Occlusion and stenosis of unspecified carotid artery: Secondary | ICD-10-CM | POA: Diagnosis not present

## 2018-02-16 NOTE — Patient Instructions (Addendum)
We will refer to pulmonary for OSA   Medication Instructions:   Stop the lisinopril  Labwork:  No new labs needed  Testing/Procedures:  We will order a carotid u/s for carotid stenosis   Follow-Up: It was a pleasure seeing you in the office today. Please call us if you have new issues that need to be addressed before your next appt.  [phone]  Your physician wants you to follow-up in: 12 months.  You will receive a reminder letter in the mail two months in advance. If you don't receive a letter, please call our office to schedule the follow-up appointment.  If you need a refill on your cardiac medications before your next appointment, please call your pharmacy.  For educational health videos Log in to : [email or web address] Or : [email or web address], password : [email or web address]

## 2018-02-20 ENCOUNTER — Ambulatory Visit (INDEPENDENT_AMBULATORY_CARE_PROVIDER_SITE_OTHER): Payer: PPO | Admitting: Internal Medicine

## 2018-02-20 ENCOUNTER — Encounter: Payer: Self-pay | Admitting: Internal Medicine

## 2018-02-20 VITALS — BP 118/72 | HR 65 | Resp 16 | Ht 68.0 in | Wt 198.0 lb

## 2018-02-20 DIAGNOSIS — G4719 Other hypersomnia: Secondary | ICD-10-CM

## 2018-02-20 DIAGNOSIS — G4733 Obstructive sleep apnea (adult) (pediatric): Secondary | ICD-10-CM | POA: Diagnosis not present

## 2018-02-20 NOTE — Progress Notes (Signed)
[facility] Consultation      Assessment and Plan:  Obstructive Sleep Apnea, excessive daytime sleepiness.  --Symptoms and signs of obstructive sleep apnea. - We will send for sleep study.  Patient had a bad experience with an in lab sleep study, and has unpaid medical bills from there, therefore he does not want an in lab sleep study.  Discussed that I am not sure that a home sleep test would be covered for him but I would be happy to order this.  Depression, diabetes mellitis, essential hypertension.  - Sleep apnea can contribute to above conditions, therefore treatment of sleep apnea is important part of their management.  Chronic rhinitis. - Patient advised to start Flonase 2 sprays in each nostril. -If not improved asked to call us back and we can try nasal ipratropium.  Orders Placed This Encounter  Procedures  . Home sleep test     Date: 02/20/2018  MRN# [id number] Todd Clay 1945-11-09    Todd Clay is a 72 y.o. old male seen in consultation for chief complaint of:    Chief Complaint  Patient presents with  . Consult    Referred by Dr. Rockey Situ for eval of possible OSA:  . excessive daytime sleepiness    snoring    HPI:   The patient is a 72 year old male with history of obstructive sleep apnea, was previously on CPAP but then stopped, turned his machine and after he lost significant amount of weight.  He returns with recurrent symptoms of excessive daytime sleepiness.  Typically goes to bed at around 1130 to about 2am then watches television for about 2-3 hours, goes to sleep around 5 am, then gets out of bed at 8:30 AM. Patient follows with Dr. Rockey Situ for history of chronic diastolic CHF, history of CABG, he also has poorly controlled type 1 diabetes mellitus, obesity.  He had his sleep study more than 5 years ago, he used it regularly for about a year. He then stopped using it after losing about 25 pounds and "figured I didn't need  it". His wife says that he does not sleep well. He is feeling sleepy again, he can sleep at any time of the day and still sleep at night.  He has been having a lot of sinus congestion recently.     PMHX:   Past Medical History:  Diagnosis Date  . CAD (coronary artery disease)   . Depression   . Diabetes mellitus with complication ([place])   . Diabetic neuropathy ([place])   . History of chickenpox   . HLD (hyperlipidemia)   . HTN (hypertension)   . MI (myocardial infarction) ([place])    x3  . Osteomyelitis ([place])    Left 2nd and 3rd metatarsal  . Proliferative diabetic retinopathy ([place])    OU  . PVD (peripheral vascular disease) ([place])   . Retinal detachment    OD   Surgical Hx:  Past Surgical History:  Procedure Laterality Date  . 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 CARDIAC CATHETERIZATION    . CORONARY ANGIOPLASTY     Patient has a total of 5 stents placed.  . CORONARY ARTERY BYPASS GRAFT  1998   x1 vessel @ [facility]  . RETINAL DETACHMENT SURGERY     Laser  . STENTS     x 5   . VITREOUS RETINAL SURGERY  1985   Laser tx [facility]   Family Hx:  Family  History  Problem Relation Age of Onset  . Kidney disease Mother        Kidney failure  . Heart disease Father        CAD  . Cancer Brother        Head & Neck Tumor  . Diabetes Maternal Aunt   . Diabetes Maternal Uncle   . Diabetes Maternal Grandmother   . Diabetes Maternal Grandfather    Social Hx:   Social History   Tobacco Use  . Smoking status: Never Smoker  . Smokeless tobacco: Never Used  Substance Use Topics  . Alcohol use: No  . Drug use: No   Medication:    Current Outpatient Medications:  .  blood glucose meter kit and supplies KIT, Dispense based on patient and insurance preference. Use up to four times daily as directed. (FOR ICD-9 250.00, 250.01)., Disp: 1 each, Rfl: 0 .  clopidogrel (PLAVIX) 75 MG tablet, Take 1 tablet (75 mg total) by mouth daily., Disp: 90 tablet, Rfl: 3 .   gabapentin (NEURONTIN) 300 MG capsule, TAKE THREE CAPSULES BY MOUTH THREE TIMES DAILY, Disp: 270 capsule, Rfl: 1 .  insulin regular (NOVOLIN R RELION) 100 units/mL injection, Inject 0.06 mLs (6 Units total) into the skin 3 (three) times daily before meals., Disp: 10 mL, Rfl: 2 .  simvastatin (ZOCOR) 80 MG tablet, TAKE ONE TABLET BY MOUTH IN THE EVENING AT 6 PM, Disp: 90 tablet, Rfl: 2 .  TRESIBA FLEXTOUCH 100 UNIT/ML SOPN FlexTouch Pen, Inject 16 Units into the skin at bedtime. , Disp: , Rfl: 3   Allergies:  Patient has no known allergies.  Review of Systems: Gen:  Denies  fever, sweats, chills HEENT: Denies blurred vision, double vision. bleeds, sore throat Cvc:  No dizziness, chest pain. Resp:   Denies cough or sputum production, shortness of breath Gi: Denies swallowing difficulty, stomach pain. Gu:  Denies bladder incontinence, burning urine Ext:   No Joint pain, stiffness. Skin: No skin rash,  hives  Endoc:  No polyuria, polydipsia. Psych: No depression, insomnia. Other:  All other systems were reviewed with the patient and were negative other that what is mentioned in the HPI.   Physical Examination:   VS: BP 118/72 (BP Location: Left Arm, Cuff Size: Normal)   Pulse 65   Resp 16   Ht _0  (1.727 m)   Wt 198 lb (89.8 kg)   SpO2 97%   BMI 30.11 kg/m   General Appearance: No distress  Neuro:without focal findings,  speech normal,  HEENT: PERRLA, EOM intact.   Pulmonary: normal breath sounds, No wheezing.  CardiovascularNormal S1,S2.  No m/r/g.   Abdomen: Benign, Soft, non-tender. Renal:  No costovertebral tenderness  GU:  No performed at this time. Endoc: No evident thyromegaly, no signs of acromegaly. Skin:   warm, no rashes, no ecchymosis  Extremities: normal, no cyanosis, clubbing.  Other findings:    LABORATORY PANEL:   CBC No results for input(s): WBC, HGB, HCT, PLT in the last 168  hours. ------------------------------------------------------------------------------------------------------------------  Chemistries  No results for input(s): NA, K, CL, CO2, GLUCOSE, BUN, CREATININE, CALCIUM, MG, AST, ALT, ALKPHOS, BILITOT in the last 168 hours.  Invalid input(s): GFRCGP ------------------------------------------------------------------------------------------------------------------  Cardiac Enzymes No results for input(s): TROPONINI in the last 168 hours. ------------------------------------------------------------  RADIOLOGY:  No results found.     Thank  you for the consultation and for allowing [facility] to assist in the care of your patient. Our recommendations are noted  above.  Please contact us if we can be of further service.   Marda Stalker, M.D., F.C.C.P.  Board Certified in Internal Medicine, Pulmonary Medicine, [place], and Sleep Medicine.  [facility] Office Number: [phone]   02/20/2018

## 2018-02-20 NOTE — Patient Instructions (Addendum)
Will send for a sleep study.  Try OTC flonase, 2 sprays in each nostril for nasal congestion.    Sleep Apnea    Sleep apnea is disorder that affects a person's sleep. A person with sleep apnea has abnormal pauses in their breathing when they sleep. It is hard for them to get a good sleep. This makes a person tired during the day. It also can lead to other physical problems. There are three types of sleep apnea. One type is when breathing stops for a short time because your airway is blocked (obstructive sleep apnea). Another type is when the brain sometimes fails to give the normal signal to breathe to the muscles that control your breathing (central sleep apnea). The third type is a combination of the other two types.  HOME CARE   Take all medicine as told by your doctor.  Avoid alcohol, calming medicines (sedatives), and depressant drugs.  Try to lose weight if you are overweight. Talk to your doctor about a healthy weight goal.  Your doctor may have you use a device that helps to open your airway. It can help you get the air that you need. It is called a positive airway pressure (PAP) device.   MAKE SURE YOU:   Understand these instructions.  Will watch your condition.  Will get help right away if you are not doing well or get worse.  It may take approximately 1 month for you to get used to wearing her CPAP every night.  Be sure to work with your machine to get used to it, be patient, it may take time!  If you have trouble tolerating CPAP DO NOT RETURN YOUR MACHINE; Contact our office to see if we can help you tolerate the CPAP better first!

## 2018-02-26 ENCOUNTER — Encounter: Payer: Self-pay | Admitting: Internal Medicine

## 2018-02-26 DIAGNOSIS — G4733 Obstructive sleep apnea (adult) (pediatric): Secondary | ICD-10-CM | POA: Diagnosis not present

## 2018-02-26 DIAGNOSIS — G4719 Other hypersomnia: Secondary | ICD-10-CM

## 2018-02-27 ENCOUNTER — Telehealth: Payer: Self-pay

## 2018-02-27 DIAGNOSIS — G4733 Obstructive sleep apnea (adult) (pediatric): Secondary | ICD-10-CM | POA: Diagnosis not present

## 2018-02-27 NOTE — Telephone Encounter (Signed)
LM on VM for patient to call for sleep study results.

## 2018-02-28 NOTE — Telephone Encounter (Signed)
LMTCB x2  

## 2018-03-01 NOTE — Telephone Encounter (Signed)
Orders will be placed after patient contact.   OV: 02/20/18  Recommendations: Auto-cpap with pressure range of 5-15 cmH20.

## 2018-03-01 NOTE — Telephone Encounter (Signed)
3 attempt to contact patient. Message closed.

## 2018-03-07 ENCOUNTER — Ambulatory Visit (INDEPENDENT_AMBULATORY_CARE_PROVIDER_SITE_OTHER): Payer: PPO

## 2018-03-07 DIAGNOSIS — I6529 Occlusion and stenosis of unspecified carotid artery: Secondary | ICD-10-CM

## 2018-03-19 ENCOUNTER — Telehealth: Payer: Self-pay | Admitting: Cardiovascular Disease

## 2018-03-19 NOTE — Telephone Encounter (Signed)
I left a message for the patient to call regarding his carotid US results.

## 2018-03-21 NOTE — Telephone Encounter (Signed)
Results called to pt. Pt verbalized understanding.  

## 2018-03-24 ENCOUNTER — Other Ambulatory Visit: Payer: Self-pay | Admitting: Cardiovascular Disease

## 2018-04-17 DIAGNOSIS — Z23 Encounter for immunization: Secondary | ICD-10-CM | POA: Diagnosis not present

## 2018-04-17 DIAGNOSIS — E785 Hyperlipidemia, unspecified: Secondary | ICD-10-CM | POA: Diagnosis not present

## 2018-04-17 DIAGNOSIS — E1159 Type 2 diabetes mellitus with other circulatory complications: Secondary | ICD-10-CM | POA: Diagnosis not present

## 2018-04-17 DIAGNOSIS — E1069 Type 1 diabetes mellitus with other specified complication: Secondary | ICD-10-CM | POA: Diagnosis not present

## 2018-04-17 DIAGNOSIS — I10 Essential (primary) hypertension: Secondary | ICD-10-CM | POA: Diagnosis not present

## 2018-04-17 DIAGNOSIS — E1042 Type 1 diabetes mellitus with diabetic polyneuropathy: Secondary | ICD-10-CM | POA: Diagnosis not present

## 2018-07-26 DIAGNOSIS — E785 Hyperlipidemia, unspecified: Secondary | ICD-10-CM | POA: Diagnosis not present

## 2018-07-26 DIAGNOSIS — E1042 Type 1 diabetes mellitus with diabetic polyneuropathy: Secondary | ICD-10-CM | POA: Diagnosis not present

## 2018-07-26 DIAGNOSIS — I10 Essential (primary) hypertension: Secondary | ICD-10-CM | POA: Diagnosis not present

## 2018-07-26 DIAGNOSIS — N528 Other male erectile dysfunction: Secondary | ICD-10-CM | POA: Diagnosis not present

## 2018-07-26 DIAGNOSIS — E1159 Type 2 diabetes mellitus with other circulatory complications: Secondary | ICD-10-CM | POA: Diagnosis not present

## 2018-07-26 DIAGNOSIS — E1069 Type 1 diabetes mellitus with other specified complication: Secondary | ICD-10-CM | POA: Diagnosis not present

## 2018-09-04 ENCOUNTER — Ambulatory Visit: Payer: Self-pay | Admitting: Urology

## 2018-09-20 ENCOUNTER — Other Ambulatory Visit: Payer: Self-pay | Admitting: Cardiovascular Disease

## 2018-11-26 ENCOUNTER — Ambulatory Visit: Payer: PPO | Admitting: Urology

## 2018-11-26 ENCOUNTER — Encounter: Payer: Self-pay | Admitting: Urology

## 2018-11-26 ENCOUNTER — Other Ambulatory Visit: Payer: Self-pay

## 2018-11-26 VITALS — BP 139/56 | HR 73 | Ht 68.0 in | Wt 203.0 lb

## 2018-11-26 DIAGNOSIS — N5201 Erectile dysfunction due to arterial insufficiency: Secondary | ICD-10-CM

## 2018-11-27 ENCOUNTER — Encounter: Payer: Self-pay | Admitting: Urology

## 2018-11-27 NOTE — Progress Notes (Signed)
11/26/2018 7:24 AM   Todd Clay 01-10-1946 [id number]  Referring provider: Lonia Farber, MD [address] [place],  [place] [address]  Chief Complaint  Patient presents with  . New Patient (Initial Visit)    HPI: Todd Clay is a 73 year old male seen at the request of Dr. Honor Junes for evaluation of erectile dysfunction.  He presents with a several year history of progressively worsening erectile dysfunction.  He presently has rare partial erections which are not firm enough for penetration.  He denies pain or curvature with erections.  SHIM score completed today was 7/25 indicating severe ED.  At symptom onset years ago he was treated with Viagra which was effective however states this medication has lost its effectiveness.   He also tried Muse which was not effective.  Organic risk factors include diabetes, hypertension, hyperlipidemia, coronary artery disease.   PMH: Past Medical History:  Diagnosis Date  . CAD (coronary artery disease)   . Depression   . Diabetes mellitus with complication ([place])   . Diabetic neuropathy ([place])   . History of chickenpox   . HLD (hyperlipidemia)   . HTN (hypertension)   . MI (myocardial infarction) ([place])    x3  . Osteomyelitis ([place])    Left 2nd and 3rd metatarsal  . Proliferative diabetic retinopathy ([place])    OU  . PVD (peripheral vascular disease) ([place])   . Retinal detachment    OD    Surgical History: Past Surgical History:  Procedure Laterality Date  . 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 CARDIAC CATHETERIZATION    . CORONARY ANGIOPLASTY     Patient has a total of 5 stents placed.  . CORONARY ARTERY BYPASS GRAFT  1998   x1 vessel @ [facility]  . RETINAL DETACHMENT SURGERY     Laser  . STENTS     x 5   . VITREOUS RETINAL SURGERY  1985   Laser tx OU    Home Medications:  Allergies as of 11/26/2018   No Known Allergies     Medication List       Accurate as of November 26, 2018  11:59 PM. If you have any questions, ask your nurse or doctor.        blood glucose meter kit and supplies Kit Dispense based on patient and insurance preference. Use up to four times daily as directed. (FOR ICD-9 250.00, 250.01).   clopidogrel 75 MG tablet Commonly known as: PLAVIX Take 1 tablet by mouth once daily   FreeStyle Libre 14 Day Reader Kerrin Mo USE TO TEST BLOOD SUGAR   FreeStyle Libre 14 Day Sensor Misc USE 1 SENSOR EVERY 14 DAYS   gabapentin 300 MG capsule Commonly known as: NEURONTIN TAKE THREE CAPSULES BY MOUTH THREE TIMES DAILY   insulin regular 100 units/mL injection Commonly known as: NovoLIN R ReliOn Inject 0.06 mLs (6 Units total) into the skin 3 (three) times daily before meals.   lisinopril 5 MG tablet Commonly known as: ZESTRIL Take by mouth.   simvastatin 80 MG tablet Commonly known as: ZOCOR TAKE ONE TABLET BY MOUTH IN THE EVENING AT 6 PM   Tresiba FlexTouch 100 UNIT/ML Sopn FlexTouch Pen Generic drug: insulin degludec Inject 16 Units into the skin at bedtime.       Allergies: No Known Allergies  Family History: Family History  Problem Relation Age of Onset  . Kidney disease Mother        Kidney  failure  . Heart disease Father        CAD  . Cancer Brother        Head & Neck Tumor  . Diabetes Maternal Aunt   . Diabetes Maternal Uncle   . Diabetes Maternal Grandmother   . Diabetes Maternal Grandfather     Social History:  reports that he has never smoked. He has never used smokeless tobacco. He reports that he does not drink alcohol or use drugs.  ROS: UROLOGY Frequent Urination?: No Hard to postpone urination?: No Burning/pain with urination?: No Get up at night to urinate?: No Leakage of urine?: No Urine stream starts and stops?: No Trouble starting stream?: No Do you have to strain to urinate?: No Blood in urine?: No Urinary tract infection?: No Sexually transmitted disease?: No Injury to kidneys or bladder?: No Painful  intercourse?: No Weak stream?: No Erection problems?: Yes Penile pain?: No  Gastrointestinal Nausea?: No Vomiting?: No Indigestion/heartburn?: No Diarrhea?: No Constipation?: No  Constitutional Fever: No Night sweats?: No Weight loss?: No Fatigue?: No  Skin Skin rash/lesions?: No Itching?: No  Eyes Blurred vision?: No Double vision?: No  Ears/Nose/Throat Sore throat?: No Sinus problems?: No  Hematologic/Lymphatic Swollen glands?: No Easy bruising?: No  Cardiovascular Leg swelling?: No Chest pain?: No  Respiratory Cough?: No Shortness of breath?: No  Endocrine Excessive thirst?: No  Musculoskeletal Back pain?: No Joint pain?: No  Neurological Headaches?: No Dizziness?: No  Psychologic Depression?: No Anxiety?: No  Physical Exam: BP (!) 139/56 (BP Location: Left Arm, Patient Position: Sitting)   Pulse 73   Ht _0  (1.727 m)   Wt 203 lb (92.1 kg)   BMI 30.87 kg/m   Constitutional:  Alert and oriented, No acute distress. HEENT: [place] AT, moist mucus membranes.  Trachea midline, no masses. Cardiovascular: No clubbing, cyanosis, or edema. Respiratory: Normal respiratory effort, no increased work of breathing. GI: Abdomen is soft, nontender, nondistended, no abdominal masses GU: No CVA tenderness.  Penis without lesions or plaques.  Testes descended bilaterally without masses or tenderness.  Spermatic cord/epididymis palpably normal bilaterally. Lymph: No cervical or inguinal lymphadenopathy. Skin: No rashes, bruises or suspicious lesions. Neurologic: Grossly intact, no focal deficits, moving all 4 extremities. Psychiatric: Normal mood and affect.   Assessment & Plan:   73 year old male with severe ED and multiple organic risk factors.  PDE 5 inhibitor therapy is no longer effective.  We discussed other available treatment options including intracavernosal injections, vacuum erection devices and penile implant surgery.  The pros and cons of each  treatment were discussed.    He was initially interested in a vacuum erection device and was given literature and a prescription.  He will return as needed.   Abbie Sons, [place] [address], [place], [place] [address] [phone]

## 2018-11-28 DIAGNOSIS — I10 Essential (primary) hypertension: Secondary | ICD-10-CM | POA: Diagnosis not present

## 2018-11-28 DIAGNOSIS — E1159 Type 2 diabetes mellitus with other circulatory complications: Secondary | ICD-10-CM | POA: Diagnosis not present

## 2018-11-28 DIAGNOSIS — E1042 Type 1 diabetes mellitus with diabetic polyneuropathy: Secondary | ICD-10-CM | POA: Diagnosis not present

## 2018-11-28 DIAGNOSIS — E1069 Type 1 diabetes mellitus with other specified complication: Secondary | ICD-10-CM | POA: Diagnosis not present

## 2018-11-28 DIAGNOSIS — E785 Hyperlipidemia, unspecified: Secondary | ICD-10-CM | POA: Diagnosis not present

## 2018-12-03 ENCOUNTER — Telehealth: Payer: Self-pay | Admitting: Urology

## 2018-12-03 NOTE — Telephone Encounter (Signed)
ERROR

## 2018-12-23 ENCOUNTER — Other Ambulatory Visit: Payer: Self-pay | Admitting: Cardiovascular Disease

## 2019-01-07 ENCOUNTER — Encounter: Payer: Self-pay | Admitting: Urology

## 2019-01-07 ENCOUNTER — Ambulatory Visit (INDEPENDENT_AMBULATORY_CARE_PROVIDER_SITE_OTHER): Payer: PPO | Admitting: Urology

## 2019-01-07 ENCOUNTER — Other Ambulatory Visit: Payer: Self-pay

## 2019-01-07 VITALS — BP 111/64 | HR 78 | Ht 68.0 in | Wt 203.0 lb

## 2019-01-07 DIAGNOSIS — N5201 Erectile dysfunction due to arterial insufficiency: Secondary | ICD-10-CM | POA: Diagnosis not present

## 2019-01-07 MED ORDER — NONFORMULARY OR COMPOUNDED ITEM: 0 refills | Status: DC

## 2019-01-07 NOTE — Addendum Note (Signed)
Addended by: Tommy Rainwater on: 01/07/2019 09:34 AM   Modules accepted: Orders

## 2019-01-07 NOTE — Progress Notes (Signed)
01/07/2019 8:57 AM   Todd Clay 12/14/1945 [id number]  Referring provider: Lonia Farber, MD [address] [place],  [place] [address]  Chief Complaint  Patient presents with  . Erectile Dysfunction    follow up    HPI: Todd Clay was seen June 2020 for ED and was interested in obtaining a vacuum erection device.  After thinking it over he has decided he wants to try intracavernosal injections and presents today to discuss further.   PMH: Past Medical History:  Diagnosis Date  . CAD (coronary artery disease)   . Depression   . Diabetes mellitus with complication ([place])   . Diabetic neuropathy ([place])   . History of chickenpox   . HLD (hyperlipidemia)   . HTN (hypertension)   . MI (myocardial infarction) ([place])    x3  . Osteomyelitis ([place])    Left 2nd and 3rd metatarsal  . Proliferative diabetic retinopathy ([place])    OU  . PVD (peripheral vascular disease) ([place])   . Retinal detachment    OD    Surgical History: Past Surgical History:  Procedure Laterality Date  . 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 CARDIAC CATHETERIZATION    . CORONARY ANGIOPLASTY     Patient has a total of 5 stents placed.  . CORONARY ARTERY BYPASS GRAFT  1998   x1 vessel @ [facility]  . RETINAL DETACHMENT SURGERY     Laser  . STENTS     x 5   . VITREOUS RETINAL SURGERY  1985   Laser tx OU    Home Medications:  Allergies as of 01/07/2019   No Known Allergies     Medication List       Accurate as of January 07, 2019  8:57 AM. If you have any questions, ask your nurse or doctor.        blood glucose meter kit and supplies Kit Dispense based on patient and insurance preference. Use up to four times daily as directed. (FOR ICD-9 250.00, 250.01).   clopidogrel 75 MG tablet Commonly known as: PLAVIX Take 1 tablet by mouth once daily   FreeStyle Libre 14 Day Reader Kerrin Mo USE TO TEST BLOOD SUGAR   FreeStyle Libre 14 Day Sensor Misc USE 1 SENSOR EVERY  14 DAYS   gabapentin 300 MG capsule Commonly known as: NEURONTIN TAKE THREE CAPSULES BY MOUTH THREE TIMES DAILY   insulin regular 100 units/mL injection Commonly known as: NovoLIN R ReliOn Inject 0.06 mLs (6 Units total) into the skin 3 (three) times daily before meals.   lisinopril 5 MG tablet Commonly known as: ZESTRIL Take by mouth.   simvastatin 80 MG tablet Commonly known as: ZOCOR TAKE ONE TABLET BY MOUTH IN THE EVENING AT 6 PM   Tresiba FlexTouch 100 UNIT/ML Sopn FlexTouch Pen Generic drug: insulin degludec Inject 16 Units into the skin at bedtime.       Allergies: No Known Allergies  Family History: Family History  Problem Relation Age of Onset  . Kidney disease Mother        Kidney failure  . Heart disease Father        CAD  . Cancer Brother        Head & Neck Tumor  . Diabetes Maternal Aunt   . Diabetes Maternal Uncle   . Diabetes Maternal Grandmother   . Diabetes Maternal Grandfather     Social History:  reports that he has never smoked.  He has never used smokeless tobacco. He reports that he does not drink alcohol or use drugs.  ROS: UROLOGY Frequent Urination?: No Hard to postpone urination?: No Burning/pain with urination?: No Get up at night to urinate?: No Leakage of urine?: No Urine stream starts and stops?: No Trouble starting stream?: No Do you have to strain to urinate?: No Blood in urine?: No Urinary tract infection?: No Sexually transmitted disease?: No Injury to kidneys or bladder?: No Painful intercourse?: No Weak stream?: No Erection problems?: No Penile pain?: No  Gastrointestinal Nausea?: No Vomiting?: No Indigestion/heartburn?: No Diarrhea?: No Constipation?: No  Constitutional Fever: No Night sweats?: No Weight loss?: No Fatigue?: No  Skin Skin rash/lesions?: No Itching?: No  Eyes Blurred vision?: No Double vision?: No  Ears/Nose/Throat Sore throat?: No Sinus problems?: No  Hematologic/Lymphatic  Swollen glands?: No Easy bruising?: No  Cardiovascular Leg swelling?: No Chest pain?: No  Respiratory Cough?: No Shortness of breath?: No  Endocrine Excessive thirst?: No  Musculoskeletal Back pain?: No Joint pain?: No  Neurological Headaches?: No Dizziness?: No  Psychologic Depression?: No Anxiety?: No  Physical Exam: BP 111/64   Pulse 78   Ht _0  (1.727 m)   Wt 203 lb (92.1 kg)   BMI 30.87 kg/m   Constitutional:  Alert and oriented, No acute distress. HEENT: [place] AT, moist mucus membranes.  Trachea midline, no masses. Cardiovascular: No clubbing, cyanosis, or edema. Respiratory: Normal respiratory effort, no increased work of    Assessment & Plan:    - Erectile dysfunction We discussed intracavernosal injections and potential side effects of priapism and corporal scarring.  Will send in Rx Trimix to [facility].  He will follow-up with [facility] to begin injection training.   Abbie Sons, [place] [address] [place], [place] [address] [phone]

## 2019-01-08 NOTE — Progress Notes (Signed)
He did not bring his medication.

## 2019-01-09 ENCOUNTER — Other Ambulatory Visit: Payer: Self-pay

## 2019-01-09 ENCOUNTER — Ambulatory Visit (INDEPENDENT_AMBULATORY_CARE_PROVIDER_SITE_OTHER): Payer: PPO | Admitting: Urology

## 2019-01-09 ENCOUNTER — Encounter: Payer: Self-pay | Admitting: Urology

## 2019-01-09 VITALS — BP 128/75 | HR 72 | Ht 68.0 in | Wt 203.0 lb

## 2019-01-09 DIAGNOSIS — N5201 Erectile dysfunction due to arterial insufficiency: Secondary | ICD-10-CM

## 2019-01-10 ENCOUNTER — Encounter: Payer: Self-pay | Admitting: Urology

## 2019-01-10 ENCOUNTER — Ambulatory Visit: Payer: PPO | Admitting: Urology

## 2019-01-10 VITALS — BP 138/78 | HR 82 | Wt 203.0 lb

## 2019-01-10 DIAGNOSIS — N5201 Erectile dysfunction due to arterial insufficiency: Secondary | ICD-10-CM

## 2019-01-10 NOTE — Progress Notes (Signed)
Mr. Nunn presents today for a Trimix titration.  He is no longer spontaneous erections x 3 years.   He has had no response to PDE5i's for three to five years.  He denies any history of sickle cell anemia or trait, a history of multiple myeloma or a history of leukemia.  He has not taken trazodone or a PDE5i's today.    Patient's left corpus cavernosum is identified.  An area near the base of the penis is cleansed with rubbing alcohol.  Careful to avoid the dorsal vein, 4 mcg of Trimix (papaverine 30 mg, phentolamine 1 mg and prostaglandin E1 10 mcg, Lot # [id number] ex_[id number]  exp # 02/17/2019) is injected at a 90 degree angle into the left corpus cavernosum near the base of the penis.  Patient experienced anerection in 15 minutes, but it was not satisfactory to the patient.  I then injected 2 mcg into his right corpus cavernosum and he experienced no improvement in his erection.    He will inject 8 mcg on Friday morning and report his results.   Advised patient of the condition of priapism, painful erection lasting for more than four hours, and to contact the office immediately or seek treatment in the ED

## 2019-01-11 ENCOUNTER — Ambulatory Visit: Payer: PPO | Admitting: Urology

## 2019-01-31 ENCOUNTER — Ambulatory Visit: Payer: PPO | Admitting: Urology

## 2019-02-05 ENCOUNTER — Telehealth: Payer: Self-pay | Admitting: Urology

## 2019-02-05 MED ORDER — NONFORMULARY OR COMPOUNDED ITEM: 6 refills | Status: DC

## 2019-02-05 NOTE — Telephone Encounter (Signed)
Patient wants to speak to [facility] only. He would like for her to call him today.

## 2019-02-05 NOTE — Telephone Encounter (Signed)
Script was ordered and printed to be faxed

## 2019-02-05 NOTE — Telephone Encounter (Signed)
Would you call in a script for the high dose Trimix (30 mg PAPA, 1 mL phen, 50 mg prostaglandin) for Todd Clay?  I have advised him to start with 4 mcg and titrate with 6 mcg, 8 mcg to 10 mcg if needed.  He will report results.

## 2019-03-24 ENCOUNTER — Other Ambulatory Visit: Payer: Self-pay | Admitting: Cardiovascular Disease

## 2019-03-25 NOTE — Telephone Encounter (Signed)
Pt overdue for 12 month f/u. °Please contact pt for future appointment. °

## 2019-03-26 ENCOUNTER — Other Ambulatory Visit: Payer: Self-pay

## 2019-03-26 MED ORDER — CLOPIDOGREL BISULFATE 75 MG PO TABS: 75.0000 mg | ORAL_TABLET | Freq: Every day | ORAL | 0 refills | Status: DC

## 2019-03-26 NOTE — Telephone Encounter (Signed)
*  STAT* If patient is at the pharmacy, call can be transferred to refill team.   1. Which medications need to be refilled? (please list name of each medication and dose if known) plavix  2. Which pharmacy/location (including street and city if local pharmacy) is medication to be sent to? [place]  3. Do they need a 30 day or 90 day supply? [place]

## 2019-04-01 ENCOUNTER — Telehealth: Payer: Self-pay | Admitting: Urology

## 2019-04-01 ENCOUNTER — Other Ambulatory Visit: Payer: Self-pay | Admitting: Urology

## 2019-04-01 DIAGNOSIS — N5201 Erectile dysfunction due to arterial insufficiency: Secondary | ICD-10-CM

## 2019-04-01 NOTE — Telephone Encounter (Signed)
Pt called office and would like for you to give him a call.  He wouldn't tell me why, but says it's concerning his care.

## 2019-04-01 NOTE — Progress Notes (Unsigned)
I have placed a referral to Dr. Francesca Jewett for a penile prothesis.

## 2019-04-03 DIAGNOSIS — E1042 Type 1 diabetes mellitus with diabetic polyneuropathy: Secondary | ICD-10-CM | POA: Diagnosis not present

## 2019-04-03 DIAGNOSIS — I10 Essential (primary) hypertension: Secondary | ICD-10-CM | POA: Diagnosis not present

## 2019-04-03 DIAGNOSIS — E1159 Type 2 diabetes mellitus with other circulatory complications: Secondary | ICD-10-CM | POA: Diagnosis not present

## 2019-04-03 DIAGNOSIS — E1069 Type 1 diabetes mellitus with other specified complication: Secondary | ICD-10-CM | POA: Diagnosis not present

## 2019-04-03 DIAGNOSIS — E785 Hyperlipidemia, unspecified: Secondary | ICD-10-CM | POA: Diagnosis not present

## 2019-04-03 NOTE — Telephone Encounter (Signed)
Scheduled

## 2019-04-29 NOTE — Progress Notes (Signed)
Cardiology Office Note  Date:  04/30/2019   ID:  Todd Clay, Todd Clay [birth year], MRN [id number]  PCP:  Lonia Farber, MD   Chief Complaint  Patient presents with  . other    12 month f/u no complaints today.  Meds reviewed verbally with pt.    HPI:  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 Chronic Diastolic CHF PVCs and bigeminal pattern in the past 2 office visits who presents for routine followup of his coronary artery disease, leg swelling and shortness of breath  Doing well, no regular exercise program Was told testosterone is low Thinks that is affecting his energy and libido  Some cheating with diet, candy  Reports orthostasis symptoms have resolved Leg swelling above the sock line, no significant shortness of breath Wonders if he needs Lasix  sedentary lifestyle, wife reports that he sleeps most of the day, sits and watches TV  Lab work reviewed CR 1.1 HBA1C 7.3 Total chol 152, LDL 77  EKG personally reviewed by myself on todays visit Shows normal sinus rhythm with rate 69 bpm PVCs  Past medical history reviewed Echocardiogram  showing preserved LV function, diastolic relaxation abnormality  Cardiac catheterization in 2008 at [facility] showed 99% proximal RCA disease with endeavor DES placed 2.5 x 15 mm, also with 60% distal RCA disease, 100% OM1 disease followed by 50%, 80% proximal LAD disease, 100% followed by 90% mid LAD disease, 100% diagonal disease Normal ejection fraction at that time estimated greater than 60%  Prior cardiac catheterization in July 2005 details 60% proximal RCA  followed by 50%, 90% and 70% distal RCA disease 90% mid left circumflex disease, 90% OM 3 disease, 70% proximal LAD disease extending to the mid vessel, 100% mid LAD disease, 70% distal LAD disease  Cardiac catheterization July 2000 with 70% proximal RCA disease, 90% mid RCA disease, 70% distal RCA disease, 70% proximal left circumflex disease, 70% OM1 disease, 100% OM 2 disease, 70% proximal LAD to mid LAD disease, 70% mid and distal LAD disease, 100% diagonal disease   lab work in the hospital 07/26/2013 shows creatinine 1.43, BUN 21, potassium 4.1 Total cholesterol 130, hemoglobin A1c of 10  PMH:   has a past medical history of CAD (coronary artery disease), Depression, Diabetes mellitus with complication ([place]), Diabetic neuropathy ([place]), History of chickenpox, HLD (hyperlipidemia), HTN (hypertension), MI (myocardial infarction) ([place]), Osteomyelitis ([place]), Proliferative diabetic retinopathy ([place]), PVD (peripheral vascular disease) ([place]), and Retinal detachment.  PSH:    Past Surgical History:  Procedure Laterality Date  . 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 CARDIAC CATHETERIZATION    . CORONARY ANGIOPLASTY     Patient has a total of 5 stents placed.  . CORONARY ARTERY BYPASS GRAFT  1998   x1 vessel @ [facility]  . RETINAL DETACHMENT SURGERY     Laser  . STENTS     x 5   . VITREOUS RETINAL SURGERY  1985   Laser tx OU    Current Outpatient Medications  Medication Sig Dispense Refill  . blood glucose meter kit and supplies KIT  Dispense based on patient and insurance preference. Use up to four times daily as directed. (FOR ICD-9 250.00, 250.01). 1 each 0  . clopidogrel (PLAVIX) 75 MG tablet Take 1 tablet (75 mg total) by mouth daily. 90 tablet 0  . Continuous Blood Gluc Receiver (FREESTYLE LIBRE 14 DAY READER) DEVI USE TO TEST BLOOD SUGAR    . Continuous Blood Gluc Sensor (FREESTYLE LIBRE 14 DAY SENSOR) MISC USE 1 SENSOR EVERY 14 DAYS    . gabapentin  (NEURONTIN) 300 MG capsule TAKE THREE CAPSULES BY MOUTH THREE TIMES DAILY 270 capsule 1  . insulin regular (NOVOLIN R RELION) 100 units/mL injection Inject 0.06 mLs (6 Units total) into the skin 3 (three) times daily before meals. 10 mL 2  . lisinopril (ZESTRIL) 5 MG tablet Take by mouth.    . NONFORMULARY OR COMPOUNDED ITEM Trimix (30/1/10)-(Pap/Phent/PGE)  Test Dose 24m vial  Qty #1 Refills 0  [phone] [phone] each 0  . NONFORMULARY OR COMPOUNDED ITEM Super Trimix (30/1/50)-(Pap/Phent/PGE)  Dosage: Inject 0.04 cc per injection titrate up to 0.6, 0.8, 148mas needed   Vial 35m27mQty #10 vials Refills 6  [id number] [phone] each 6  . simvastatin (ZOCOR) 80 MG tablet TAKE ONE TABLET BY MOUTH IN THE EVENING AT 6 PM 90 tablet 2  . TRESIBA FLEXTOUCH 100 UNIT/ML SOPN FlexTouch Pen Inject 14 Units into the skin daily.   3   No current facility-administered medications for this visit.     Allergies:   Patient has no known allergies.   Social History:  The patient  reports that he has never smoked. He has never used smokeless tobacco. He reports that he does not drink alcohol or use drugs.   Family History:   family history includes Cancer in his brother; Diabetes in his maternal aunt, maternal grandfather, maternal grandmother, and maternal uncle; Heart disease in his father; Kidney disease in his mother.    Review of Systems: Review of Systems  Constitutional: Negative.   HENT: Negative.   Respiratory: Negative.   Cardiovascular: Negative.   Gastrointestinal: Negative.   Musculoskeletal: Negative.   Neurological: Negative.   Psychiatric/Behavioral: Negative.   All other systems reviewed and are negative.   PHYSICAL EXAM: VS:  BP (!) 142/60 (BP Location: Left Arm, Patient Position: Sitting, Cuff Size: Normal)   Pulse 69   Ht 5' 8" (1.727 m)   Wt 202 lb 12 oz (92 kg)   SpO2 98%   BMI 30.83 kg/m  , BMI  Body mass index is 30.83 kg/m.  Constitutional:  oriented to person, place, and time. No distress.  HENT:  Head: Grossly normal Eyes:  no discharge. No scleral icterus.  Neck: No JVD, no carotid bruits  Cardiovascular: Regular rate and rhythm, no murmurs appreciated Pulmonary/Chest: Clear to auscultation bilaterally, no wheezes or rails Abdominal: Soft.  no distension.  no tenderness.  Musculoskeletal: Normal range of motion Neurological:  normal muscle tone. Coordination normal. No atrophy Skin: Skin warm and dry Psychiatric: normal affect, pleasant   Recent Labs: No results found for requested labs within last 8760 hours.    Lipid Panel No results found for: CHOL, HDL, LDLCALC, TRIG    Wt Readings from Last 3 Encounters:  04/30/19 202 lb 12 oz (92 kg)  01/10/19 203 lb (92.1 kg)  01/09/19 203 lb (92.1 kg)     ASSESSMENT AND PLAN:  Chronic CHF, diastolic Has mild leg swelling Recommended Lasix 20  mg 2-3 times a week,  moderate salt and fluid intake  Hx of CABG - Currently with no symptoms of angina. No further workup at this time. Continue current medication regimen. 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 EKG 12-Lead     Signed, Esmond Plants, M.D., Ph.D. 04/30/2019  [place],  [place]

## 2019-04-30 ENCOUNTER — Ambulatory Visit (INDEPENDENT_AMBULATORY_CARE_PROVIDER_SITE_OTHER): Payer: PPO | Admitting: Cardiovascular Disease

## 2019-04-30 ENCOUNTER — Encounter: Payer: Self-pay | Admitting: Cardiovascular Disease

## 2019-04-30 ENCOUNTER — Other Ambulatory Visit: Payer: Self-pay

## 2019-04-30 VITALS — BP 142/60 | HR 69 | Ht 68.0 in | Wt 202.8 lb

## 2019-04-30 DIAGNOSIS — R0602 Shortness of breath: Secondary | ICD-10-CM

## 2019-04-30 DIAGNOSIS — I5032 Chronic diastolic (congestive) heart failure: Secondary | ICD-10-CM | POA: Diagnosis not present

## 2019-04-30 DIAGNOSIS — G4733 Obstructive sleep apnea (adult) (pediatric): Secondary | ICD-10-CM | POA: Diagnosis not present

## 2019-04-30 DIAGNOSIS — I25118 Atherosclerotic heart disease of native coronary artery with other forms of angina pectoris: Secondary | ICD-10-CM

## 2019-04-30 DIAGNOSIS — E782 Mixed hyperlipidemia: Secondary | ICD-10-CM

## 2019-04-30 DIAGNOSIS — Z951 Presence of aortocoronary bypass graft: Secondary | ICD-10-CM | POA: Diagnosis not present

## 2019-04-30 DIAGNOSIS — I739 Peripheral vascular disease, unspecified: Secondary | ICD-10-CM | POA: Diagnosis not present

## 2019-04-30 DIAGNOSIS — I6529 Occlusion and stenosis of unspecified carotid artery: Secondary | ICD-10-CM | POA: Diagnosis not present

## 2019-04-30 DIAGNOSIS — I951 Orthostatic hypotension: Secondary | ICD-10-CM | POA: Diagnosis not present

## 2019-04-30 MED ORDER — FUROSEMIDE 20 MG PO TABS: 20.0000 mg | ORAL_TABLET | Freq: Every day | ORAL | 3 refills | Status: DC | PRN

## 2019-04-30 NOTE — Patient Instructions (Addendum)
Medication Instructions:  Lasix 20 mg daily as needed for leg swelling Try twice a week  If you need a refill on your cardiac medications before your next appointment, please call your pharmacy.    Lab work: No new labs needed   If you have labs (blood work) drawn today and your tests are completely normal, you will receive your results only by: Marland Kitchen MyChart Message (if you have MyChart) OR . A paper copy in the mail If you have any lab test that is abnormal or we need to change your treatment, we will call you to review the results.   Testing/Procedures: No new testing needed   Follow-Up: At [facility], you and your health needs are our priority.  As part of our continuing mission to provide you with exceptional heart care, we have created designated Provider Care Teams.  These Care Teams include your primary Cardiologist (physician) and Advanced Practice Providers (APPs -  Physician Assistants and Nurse Practitioners) who all work together to provide you with the care you need, when you need it.  . You will need a follow up appointment in 6 months .  Marland Kitchen Providers on your designated Care Team:   . Murray Hodgkins, NP . Christell Faith, PA-C . Marrianne Mood, PA-C  Any Other Special Instructions Will Be Listed Below (If Applicable).  For educational health videos Log in to : [email or web address] Or : [email or web address], password : [email or web address]

## 2019-06-24 ENCOUNTER — Other Ambulatory Visit: Payer: Self-pay | Admitting: Cardiovascular Disease

## 2019-06-25 ENCOUNTER — Other Ambulatory Visit: Payer: Self-pay | Admitting: *Deleted

## 2019-06-25 MED ORDER — CLOPIDOGREL BISULFATE 75 MG PO TABS: 75.0000 mg | ORAL_TABLET | Freq: Every day | ORAL | 2 refills | Status: DC

## 2019-06-25 NOTE — Telephone Encounter (Signed)
Requested Prescriptions   Pending Prescriptions Disp Refills  . clopidogrel (PLAVIX) 75 MG tablet 90 tablet 2    Sig: Take 1 tablet (75 mg total) by mouth daily.

## 2019-07-26 ENCOUNTER — Ambulatory Visit: Payer: PPO | Attending: Internal Medicine

## 2019-07-26 DIAGNOSIS — Z23 Encounter for immunization: Secondary | ICD-10-CM

## 2019-07-26 NOTE — Progress Notes (Signed)
   Covid-19 Vaccination Clinic  Name:  ZHION TRUITT    MRN: [id number] DOB: [birth year]  07/26/2019  Mr. Hernandezgarci was observed post Covid-19 immunization for 15 minutes without incidence. He was provided with Vaccine Information Sheet and instruction to access the V-Safe system.   Mr. Thor was instructed to call 911 with any severe reactions post vaccine: Marland Kitchen Difficulty breathing  . Swelling of your face and throat  . A fast heartbeat  . A bad rash all over your body  . Dizziness and weakness    Immunizations Administered    Name Date Dose VIS Date Route   Pfizer COVID-19 Vaccine 07/26/2019 12:06 PM 0.3 mL 05/10/2019 Intramuscular   Manufacturer: [employer]   Lot: [id number]   [place]: [id number]

## 2019-08-07 DIAGNOSIS — E1069 Type 1 diabetes mellitus with other specified complication: Secondary | ICD-10-CM | POA: Diagnosis not present

## 2019-08-07 DIAGNOSIS — E785 Hyperlipidemia, unspecified: Secondary | ICD-10-CM | POA: Diagnosis not present

## 2019-08-07 DIAGNOSIS — E1042 Type 1 diabetes mellitus with diabetic polyneuropathy: Secondary | ICD-10-CM | POA: Diagnosis not present

## 2019-08-07 DIAGNOSIS — E1159 Type 2 diabetes mellitus with other circulatory complications: Secondary | ICD-10-CM | POA: Diagnosis not present

## 2019-08-07 DIAGNOSIS — I10 Essential (primary) hypertension: Secondary | ICD-10-CM | POA: Diagnosis not present

## 2019-08-19 DIAGNOSIS — N529 Male erectile dysfunction, unspecified: Secondary | ICD-10-CM | POA: Diagnosis not present

## 2019-08-21 ENCOUNTER — Ambulatory Visit: Payer: PPO | Attending: Internal Medicine

## 2019-08-21 DIAGNOSIS — Z23 Encounter for immunization: Secondary | ICD-10-CM

## 2019-08-21 NOTE — Progress Notes (Signed)
   Covid-19 Vaccination Clinic  Name:  Todd Clay    MRN: [id number] DOB: Oct 25, 1945  08/21/2019  [name] was observed post Covid-19 immunization for 15 minutes without incident. He was provided with Vaccine Information Sheet and instruction to access the V-Safe system.   [name] was instructed to call 911 with any severe reactions post vaccine: Marland Kitchen Difficulty breathing  . Swelling of face and throat  . A fast heartbeat  . A bad rash all over body  . Dizziness and weakness   Immunizations Administered    Name Date Dose VIS Date Route   Pfizer COVID-19 Vaccine 08/21/2019  4:12 PM 0.3 mL 05/10/2019 Intramuscular   Manufacturer: [place]   Lot: [id number]   Gann: [id number]

## 2019-10-30 ENCOUNTER — Ambulatory Visit: Payer: PPO | Admitting: Cardiovascular Disease

## 2019-11-05 ENCOUNTER — Ambulatory Visit: Payer: PPO | Admitting: Cardiovascular Disease

## 2019-12-02 NOTE — Progress Notes (Signed)
Cardiology Office Note  Date:  12/03/2019   ID:  URA HAUSEN, DOB 07-Feb-1946, MRN [id number]  PCP:  Lonia Farber, MD   Chief Complaint  Patient presents with  . office visit    6 month F/U; Meds verbally reviewed with patient.    HPI:  [name] is a pleasant 74 year old gentleman with history of  CAD,  CABG December 1997  LIMA to the LAD at [facility],  several catheterizations since that time with stents placed,  stent to the left circumflex and RCA in 2005,  Hx of poorly controlled diabetes, with PAD toe  ostiomyelitis requiring PICC line and long-term antibiotics,  Prior history of orthostasis Carotid u/s 40 to 59% on the left Chronic Diastolic CHF PVCs and bigeminal pattern in the past 2 office visits who presents for routine followup of his coronary artery disease, leg swelling and shortness of breath  In follow-up today, weight is trending upwards Sedentary lifestyle, no regular exercise program Eating more, including potato chips Prior office visit was sleeping in the daytime, sitting a lot, watching TV  Denies any chest pain current for angina No significant PND orthopnea, shortness of breath chest discomfort on exertion  Lab work reviewed in detail HBA1c 6.8 Total chol 152,LDL 77 CR 1.1  EKG personally reviewed by myself on todays visit Shows normal sinus rhythm with rate 65 bpm PVCs  Past medical history reviewed Echocardiogram  showing preserved LV function, diastolic relaxation abnormality  Cardiac catheterization in 2008 at [facility] showed 99% proximal RCA disease with endeavor DES placed 2.5 x 15 mm, also with 60% distal RCA disease, 100% OM1 disease followed by 50%, 80% proximal LAD disease, 100% followed by 90% mid LAD disease, 100% diagonal disease Normal ejection fraction at that time estimated greater than 60%  Prior cardiac catheterization in July 2005 details 60% proximal RCA followed by 50%, 90% and 70% distal RCA disease 90% mid  left circumflex disease, 90% OM 3 disease, 70% proximal LAD disease extending to the mid vessel, 100% mid LAD disease, 70% distal LAD disease  Cardiac catheterization July 2000 with 70% proximal RCA disease, 90% mid RCA disease, 70% distal RCA disease, 70% proximal left circumflex disease, 70% OM1 disease, 100% OM 2 disease, 70% proximal LAD to mid LAD disease, 70% mid and distal LAD disease, 100% diagonal disease   lab work in the hospital 07/26/2013 shows creatinine 1.43, BUN 21, potassium 4.1 Total cholesterol 130, hemoglobin A1c of 10  PMH:   has a past medical history of CAD (coronary artery disease), Depression, Diabetes mellitus with complication ([place]), Diabetic neuropathy ([place]), History of chickenpox, HLD (hyperlipidemia), HTN (hypertension), MI (myocardial infarction) ([place]), Osteomyelitis ([place]), Proliferative diabetic retinopathy ([place]), PVD (peripheral vascular disease) ([place]), and Retinal detachment.  PSH:    Past Surgical History:  Procedure Laterality Date  . 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 CARDIAC CATHETERIZATION    . CORONARY ANGIOPLASTY     Patient has a total of 5 stents placed.  . CORONARY ARTERY BYPASS GRAFT  1998   x1 vessel @ [facility]  . RETINAL DETACHMENT SURGERY     Laser  . STENTS     x 5   . VITREOUS RETINAL SURGERY  1985   Laser tx [facility]    Current Outpatient Medications  Medication Sig Dispense Refill  . aspirin EC 81 MG tablet Take 81 mg by mouth daily. Swallow whole.    . blood glucose meter kit  and supplies KIT Dispense based on patient and insurance preference. Use up to four times daily as directed. (FOR ICD-9 250.00, 250.01). 1 each 0  . clopidogrel (PLAVIX) 75 MG tablet Take 1 tablet (75 mg total) by mouth daily. 90 tablet 2  . Continuous Blood Gluc Receiver (FREESTYLE LIBRE 14 DAY READER) DEVI USE TO TEST BLOOD SUGAR    . Continuous Blood Gluc Sensor (FREESTYLE LIBRE 14 DAY SENSOR) MISC USE 1 SENSOR EVERY 14 DAYS    .  gabapentin (NEURONTIN) 300 MG capsule TAKE THREE CAPSULES BY MOUTH THREE TIMES DAILY 270 capsule 1  . insulin regular (NOVOLIN R RELION) 100 units/mL injection Inject 0.06 mLs (6 Units total) into the skin 3 (three) times daily before meals. 10 mL 2  . lisinopril (ZESTRIL) 5 MG tablet Take 5 mg by mouth daily.     . simvastatin (ZOCOR) 80 MG tablet TAKE ONE TABLET BY MOUTH IN THE EVENING AT 6 PM 90 tablet 2  . TRESIBA FLEXTOUCH 100 UNIT/ML SOPN FlexTouch Pen Inject 14 Units into the skin daily.   3  . furosemide (LASIX) 20 MG tablet Take 1 tablet (20 mg total) by mouth daily as needed (As needed for leg swelling). 90 tablet 3  . NONFORMULARY OR COMPOUNDED ITEM Trimix (30/1/10)-(Pap/Phent/PGE)  Test Dose 92m vial  Qty #1 Refills 0  [phone] [phone] each 0  . NONFORMULARY OR COMPOUNDED ITEM Super Trimix (30/1/50)-(Pap/Phent/PGE)  Dosage: Inject 0.04 cc per injection titrate up to 0.6, 0.8, 136mas needed   Vial 92m26mQty #10 vials Refills 6  [phone] [phone] each 6   No current facility-administered medications for this visit.    Allergies:   Patient has no known allergies.   Social History:  The patient  reports that he has never smoked. He has never used smokeless tobacco. He reports that he does not drink alcohol and does not use drugs.   Family History:   family history includes Cancer in his brother; Diabetes in his maternal aunt, maternal grandfather, maternal grandmother, and maternal uncle; Heart disease in his father; Kidney disease in his mother.    Review of Systems: Review of Systems  Constitutional: Negative.   HENT: Negative.   Respiratory: Negative.   Cardiovascular: Negative.   Gastrointestinal: Negative.   Musculoskeletal: Negative.   Neurological: Negative.   Psychiatric/Behavioral: Negative.   All other systems reviewed and are negative.   PHYSICAL EXAM: VS:  BP 128/70 (BP  Location: Left Arm, Patient Position: Sitting, Cuff Size: Normal)   Pulse 65   Ht _0  (1.727 m)   Wt 209 lb 6 oz (95 kg)   SpO2 97%   BMI 31.84 kg/m  , BMI Body mass index is 31.84 kg/m.  Constitutional:  oriented to person, place, and time. No distress.  HENT:  Head: Grossly normal Eyes:  no discharge. No scleral icterus.  Neck: No JVD, no carotid bruits  Cardiovascular: Regular rate and rhythm, no murmurs appreciated Pulmonary/Chest: Clear to auscultation bilaterally, no wheezes or rails Abdominal: Soft.  no distension.  no tenderness.  Musculoskeletal: Normal range of motion Neurological:  normal muscle tone. Coordination normal. No atrophy Skin: Skin warm and dry Psychiatric: normal affect, pleasant   Recent Labs: No results found for requested labs within last 8760 hours.    Lipid Panel No results found for: CHOL, HDL, LDLCALC, TRIG    Wt Readings from Last 3 Encounters:  12/03/19 209 lb 6  oz (95 kg)  04/30/19 202 lb 12 oz (92 kg)  01/10/19 203 lb (92.1 kg)     ASSESSMENT AND PLAN:  Chronic CHF, diastolic On today's visit appears euvolemic,  No edema, will recommend he continue lasix daily  Hx of CABG - Currently with no symptoms of angina. No further workup at this time. Continue current medication regimen. 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 EKG  12-Lead     Signed, Esmond Plants, M.D., Ph.D. 12/03/2019  [place], [place]

## 2019-12-03 ENCOUNTER — Other Ambulatory Visit: Payer: Self-pay

## 2019-12-03 ENCOUNTER — Ambulatory Visit: Payer: PPO | Admitting: Cardiovascular Disease

## 2019-12-03 ENCOUNTER — Encounter: Payer: Self-pay | Admitting: Cardiovascular Disease

## 2019-12-03 VITALS — BP 128/70 | HR 65 | Ht 68.0 in | Wt 209.4 lb

## 2019-12-03 DIAGNOSIS — G4733 Obstructive sleep apnea (adult) (pediatric): Secondary | ICD-10-CM

## 2019-12-03 DIAGNOSIS — E782 Mixed hyperlipidemia: Secondary | ICD-10-CM

## 2019-12-03 DIAGNOSIS — I25118 Atherosclerotic heart disease of native coronary artery with other forms of angina pectoris: Secondary | ICD-10-CM | POA: Diagnosis not present

## 2019-12-03 DIAGNOSIS — I5032 Chronic diastolic (congestive) heart failure: Secondary | ICD-10-CM | POA: Diagnosis not present

## 2019-12-03 DIAGNOSIS — R0602 Shortness of breath: Secondary | ICD-10-CM | POA: Diagnosis not present

## 2019-12-03 DIAGNOSIS — I739 Peripheral vascular disease, unspecified: Secondary | ICD-10-CM

## 2019-12-03 DIAGNOSIS — I951 Orthostatic hypotension: Secondary | ICD-10-CM

## 2019-12-03 DIAGNOSIS — Z951 Presence of aortocoronary bypass graft: Secondary | ICD-10-CM

## 2019-12-03 NOTE — Patient Instructions (Signed)
Medication Instructions:  No changes  If you need a refill on your cardiac medications before your next appointment, please call your pharmacy.    Lab work: No new labs needed   If you have labs (blood work) drawn today and your tests are completely normal, you will receive your results only by: . MyChart Message (if you have MyChart) OR . A paper copy in the mail If you have any lab test that is abnormal or we need to change your treatment, we will call you to review the results.   Testing/Procedures: No new testing needed   Follow-Up: At [facility], you and your health needs are our priority.  As part of our continuing mission to provide you with exceptional heart care, we have created designated Provider Care Teams.  These Care Teams include your primary Cardiologist (physician) and Advanced Practice Providers (APPs -  Physician Assistants and Nurse Practitioners) who all work together to provide you with the care you need, when you need it.  . You will need a follow up appointment in 6 months  . Providers on your designated Care Team:   . Christopher Berge, NP . Ryan Dunn, PA-C . Jacquelyn Visser, PA-C  Any Other Special Instructions Will Be Listed Below (If Applicable).  COVID-19 Vaccine Information can be found at: [email or web address] For questions related to vaccine distribution or appointments, please email [email or web address] or call [phone].     

## 2019-12-11 DIAGNOSIS — E1069 Type 1 diabetes mellitus with other specified complication: Secondary | ICD-10-CM | POA: Diagnosis not present

## 2019-12-11 DIAGNOSIS — E1042 Type 1 diabetes mellitus with diabetic polyneuropathy: Secondary | ICD-10-CM | POA: Diagnosis not present

## 2019-12-11 DIAGNOSIS — E785 Hyperlipidemia, unspecified: Secondary | ICD-10-CM | POA: Diagnosis not present

## 2019-12-11 DIAGNOSIS — I152 Hypertension secondary to endocrine disorders: Secondary | ICD-10-CM | POA: Diagnosis not present

## 2019-12-11 DIAGNOSIS — E1159 Type 2 diabetes mellitus with other circulatory complications: Secondary | ICD-10-CM | POA: Diagnosis not present

## 2019-12-19 ENCOUNTER — Other Ambulatory Visit: Payer: Self-pay | Admitting: Cardiovascular Disease

## 2020-01-10 ENCOUNTER — Ambulatory Visit: Payer: PPO | Admitting: Urology

## 2020-02-18 ENCOUNTER — Ambulatory Visit (HOSPITAL_COMMUNITY): Admission: RE | Admit: 2020-02-18 | Payer: PPO | Source: Ambulatory Visit

## 2020-02-18 ENCOUNTER — Other Ambulatory Visit: Payer: Self-pay | Admitting: Nurse Practitioner

## 2020-02-18 DIAGNOSIS — E118 Type 2 diabetes mellitus with unspecified complications: Secondary | ICD-10-CM

## 2020-02-18 DIAGNOSIS — Z20822 Contact with and (suspected) exposure to covid-19: Secondary | ICD-10-CM

## 2020-02-18 NOTE — Progress Notes (Signed)
Todd Clay is a 74 y.o. male that meets the FDA criteria for Emergency Use Authorization of casirivimab/imdevimab (REGEN-COV) for post-exposure prophylaxis of COVID-19 in individuals who are at high risk for progression to severe COVID-19, including hospitalization or death, and are: . Not fully vaccinated or who are not expected to mount an adequate immune response to complete SARS-CoV-2 vaccination (for example, individuals with immunocompromising conditions including those taking immunosuppressive medications) and - Have been exposed to an individual infected with SARS-CoV-2 consistent with close contact criteria per CDC or - Who are at high risk of exposure to an individual infected with SARS-CoV-2 because of occurrence of COVID-19 infection in other individuals in the same institutional setting (for example, nursing homes or prisons) . For individuals in whom repeat dosing is determined to be appropriate for ongoing exposure to SARS-CoV-2 for longer than 4 weeks and who are not expected to mount an adequate immune response to complete SARS-CoV-2 vaccination, the initial dose is 600 mg of casirivimab and 600 mg of imdevimab (1275m total) by subcutaneous injection or intravenous infusion followed by subsequent repeat dosing of 300 mg of casirivimab and 300 mg of imdevimab (6023mtotal) by subcutaneous injection or intravenous infusion once every 4 weeks for the duration of ongoing exposure.   Patient has met the above post-exposure definition and has the following high risk criteria: Diabetes  I have spoken and communicated the following to the patient or parent/caregiver regarding COVID monoclonal antibody treatment:   FDA has authorized REGEN-COV for emergency use of post-exposure prophylaxis of COVID-19   The significant known and potential risks and benefits of COVID monoclonal antibody, and the extent to which such potential risks and benefits are unknown.   Information on available  alternative treatments and the risks and benefits of those alternatives, including clinical trials.   Patients treated with COVID monoclonal antibody should continue to self-isolate and use infection control measures (e.g., wear mask, isolate, social distance, avoid sharing personal items, clean and disinfect "high touch" surfaces, and frequent handwashing) according to CDC guidelines.    The patient or parent/caregiver has the option to accept or refuse COVID monoclonal antibody treatment.  After reviewing this information with the patient, The patient has agreed to proceed with receiving casirivimab\imdevimab 120046mnjection and will be provided a copy of the patient fact sheet prior to receiving the injection.  TonFenton FoyP 02/18/2020 9:27 AM

## 2020-02-19 ENCOUNTER — Encounter: Payer: Self-pay | Admitting: *Deleted

## 2020-02-19 ENCOUNTER — Other Ambulatory Visit: Payer: Self-pay

## 2020-02-19 ENCOUNTER — Ambulatory Visit (INDEPENDENT_AMBULATORY_CARE_PROVIDER_SITE_OTHER): Payer: PPO | Admitting: *Deleted

## 2020-02-19 DIAGNOSIS — E118 Type 2 diabetes mellitus with unspecified complications: Secondary | ICD-10-CM

## 2020-02-19 DIAGNOSIS — Z20822 Contact with and (suspected) exposure to covid-19: Secondary | ICD-10-CM

## 2020-02-19 MED ORDER — CASIRIVIMAB-IMDEVIMAB 600-600 MG/10ML IJ SOLN
300.0000 mg | INTRAMUSCULAR | Status: AC
  Administered 2020-02-19 (×4): 300 mg via SUBCUTANEOUS

## 2020-02-19 NOTE — Progress Notes (Signed)
  Diagnosis: COVID-19 post-exposure prophylaxis  Provider: Lazaro Arms NP  Procedure: casirivimab\imdevimab subcutaneous injection - Provided patient with casirivimab\imdevimab fact sheet for patients, parents and caregivers prior to injection.  Complications: No immediate complications noted.  Discharge: Patient placed in observation.   Neita Carp, RN 02/19/2020

## 2020-02-19 NOTE — Patient Instructions (Signed)

## 2020-02-21 ENCOUNTER — Telehealth: Payer: Self-pay

## 2020-02-21 NOTE — Telephone Encounter (Signed)
Pt's wife called & stated her husband was having funny symptoms. She wanted to make [place] aware he has an appointment 03/04/20 as NP. Pt got antibody injection on Wednesday & received 4 shots in stomach. His arm that night after started jerking & raised up multiple times off the bed jerking. Then next day his hand was jerking & pt stated that he wasn't trying to move his it just happened. Pt stated that he has felt like he is trembling lately. She fears he has a brain tumor bc the same thing happened to her mother with out any other symptoms.   I called patient back after speaking with Joycelyn Schmid & advised that she take patient to be evaluated. If not ED then UC. She could not physically evaluate him for another 10 days & that was too long to wait. Pt's wife said that she was on isolation due to covid & I told her that she could drop him by [facility] walkin next to ED. I advised that patient should let them know that he had covid exposure & just received antibody injection. Pt's wife said that she would take him to be evaluated if another episode occurred. She said that patient felt that she was making too much out of the situation. Pt said that she would take him if he hand another episode of his arm involuntarily jerking though since that's what was advised.

## 2020-03-03 ENCOUNTER — Telehealth: Payer: Self-pay | Admitting: "Endocrinology

## 2020-03-03 NOTE — Telephone Encounter (Signed)
Patient advice message has been routed to [facility]

## 2020-03-03 NOTE — Telephone Encounter (Signed)
Patient 's wife called an stated that she sent Joycelyn Schmid a message in [place] would like for her to read it before patient's visit on Wednesday

## 2020-03-04 ENCOUNTER — Ambulatory Visit (INDEPENDENT_AMBULATORY_CARE_PROVIDER_SITE_OTHER): Payer: PPO | Admitting: Family

## 2020-03-04 ENCOUNTER — Other Ambulatory Visit (INDEPENDENT_AMBULATORY_CARE_PROVIDER_SITE_OTHER): Payer: PPO

## 2020-03-04 ENCOUNTER — Other Ambulatory Visit: Payer: Self-pay

## 2020-03-04 ENCOUNTER — Telehealth: Payer: Self-pay | Admitting: Family

## 2020-03-04 ENCOUNTER — Encounter: Payer: Self-pay | Admitting: Family

## 2020-03-04 VITALS — BP 124/78 | HR 64 | Temp 97.6°F | Ht 65.75 in | Wt 212.6 lb

## 2020-03-04 DIAGNOSIS — D619 Aplastic anemia, unspecified: Secondary | ICD-10-CM | POA: Diagnosis not present

## 2020-03-04 DIAGNOSIS — G4733 Obstructive sleep apnea (adult) (pediatric): Secondary | ICD-10-CM | POA: Diagnosis not present

## 2020-03-04 DIAGNOSIS — I25118 Atherosclerotic heart disease of native coronary artery with other forms of angina pectoris: Secondary | ICD-10-CM | POA: Diagnosis not present

## 2020-03-04 DIAGNOSIS — Z23 Encounter for immunization: Secondary | ICD-10-CM | POA: Diagnosis not present

## 2020-03-04 DIAGNOSIS — I152 Hypertension secondary to endocrine disorders: Secondary | ICD-10-CM | POA: Diagnosis not present

## 2020-03-04 DIAGNOSIS — R252 Cramp and spasm: Secondary | ICD-10-CM

## 2020-03-04 DIAGNOSIS — Z1211 Encounter for screening for malignant neoplasm of colon: Secondary | ICD-10-CM

## 2020-03-04 DIAGNOSIS — E1065 Type 1 diabetes mellitus with hyperglycemia: Secondary | ICD-10-CM | POA: Diagnosis not present

## 2020-03-04 DIAGNOSIS — E1159 Type 2 diabetes mellitus with other circulatory complications: Secondary | ICD-10-CM

## 2020-03-04 LAB — COMPREHENSIVE METABOLIC PANEL
ALT: 14 U/L (ref 0–53)
AST: 21 U/L (ref 0–37)
Albumin: 3.6 g/dL (ref 3.5–5.2)
Alkaline Phosphatase: 76 U/L (ref 39–117)
BUN: 21 mg/dL (ref 6–23)
CO2: 29 mEq/L (ref 19–32)
Calcium: 8.4 mg/dL (ref 8.4–10.5)
Chloride: 103 mEq/L (ref 96–112)
Creatinine, Ser: 1.12 mg/dL (ref 0.40–1.50)
GFR: 64.4 mL/min (ref >60.00)
Glucose, Bld: 198 mg/dL — ABNORMAL HIGH (ref 70–99)
Potassium: 4.5 mEq/L (ref 3.5–5.1)
Sodium: 137 mEq/L (ref 135–145)
Total Bilirubin: 0.5 mg/dL (ref 0.2–1.2)
Total Protein: 6.2 g/dL (ref 6.0–8.3)

## 2020-03-04 LAB — CBC WITH DIFFERENTIAL/PLATELET
Basophils Absolute: 0 10*3/uL (ref 0.0–0.1)
Basophils Relative: 0.4 % (ref 0.0–3.0)
Eosinophils Absolute: 0.3 10*3/uL (ref 0.0–0.7)
Eosinophils Relative: 3.1 % (ref 0.0–5.0)
HCT: 37.9 % — ABNORMAL LOW (ref 39.0–52.0)
Hemoglobin: 12.6 g/dL — ABNORMAL LOW (ref 13.0–17.0)
Lymphocytes Relative: 24 % (ref 12.0–46.0)
Lymphs Abs: 2.1 10*3/uL (ref 0.7–4.0)
MCHC: 33.2 g/dL (ref 30.0–36.0)
MCV: 94.8 fl (ref 78.0–100.0)
Monocytes Absolute: 0.7 10*3/uL (ref 0.1–1.0)
Monocytes Relative: 7.7 % (ref 3.0–12.0)
Neutro Abs: 5.8 10*3/uL (ref 1.4–7.7)
Neutrophils Relative %: 64.8 % (ref 43.0–77.0)
Platelets: 209 10*3/uL (ref 150.0–400.0)
RBC: 4 Mil/uL — ABNORMAL LOW (ref 4.22–5.81)
RDW: 13.3 % (ref 11.5–15.5)
WBC: 8.9 10*3/uL (ref 4.0–10.5)

## 2020-03-04 LAB — IBC + FERRITIN
Ferritin: 124.4 ng/mL (ref 22.0–322.0)
Iron: 83 ug/dL (ref 42–165)
Saturation Ratios: 33.1 % (ref 20.0–50.0)
Transferrin: 179 mg/dL — ABNORMAL LOW (ref 212.0–360.0)

## 2020-03-04 LAB — B12 AND FOLATE PANEL
Folate: 12 ng/mL (ref >5.9)
Vitamin B-12: 376 pg/mL (ref 211–911)

## 2020-03-04 LAB — TSH: TSH: 1.29 u[IU]/mL (ref 0.35–4.50)

## 2020-03-04 NOTE — Telephone Encounter (Signed)
Can I add on iron stores, b12 to labs today?

## 2020-03-04 NOTE — Progress Notes (Signed)
Subjective:    Patient ID: Todd Clay, male    DOB: 1945/08/16, 74 y.o.   MRN: [id number]  CC: Todd Clay is a 74 y.o. male who presents today to establish care, last visit in our clinic 07/2015.    HPI: Establish care today Prior PCP had been Todd Clay,  His endocrinologist.   Complains of left arm jerking episode 2 weeks ago, onset during the middle night, resolved on its own. No further episodes. Left arm rose up straight in front of him. describes pain throughout entire arm when occurred. Felt numbness in all the fingers of left hand and radiates up his left arm. No sob, cp, left shoulder pain, neck pain, tremor in hand, ha, vision changes.   Recognizes difference with hypoglycemic episode when CGM will alarm, these episodes are different. He denies diaphoresis, palpitations during episode 2 weeks ago.    Dm I - wears freestyle libre and follows with Todd Clay. a1c 7.3 11/2019.  Takes gabapentin for peripheral neuropathy    HTN- felt weak on lisinopril and since has stopped. He reports that he doesn't take losartan either. No further dizziness.   ED- follows with urology for viagra  OSA- states mild and felt aggravated by cipap and stopped wearing osa.   Ho/o CABG - follows with Todd Clay; had follow up 3 months ago. Compliant with asa, plavix, simvastatin.     2016 MRI brain Todd Clay , showed mild cortical atrophy, ischemic change.  Consult with Todd Clay 12/2014 for seizure like activity; EEG ordered at that time.    HISTORY:  Past Medical History:  Diagnosis Date  . CAD (coronary artery disease)   . Depression   . Diabetes mellitus with complication ([place])   . Diabetic neuropathy ([place])   . History of chickenpox   . HLD (hyperlipidemia)   . HTN (hypertension)   . MI (myocardial infarction) ([place])    x3  . Osteomyelitis ([place])    Left 2nd and 3rd metatarsal  . Proliferative diabetic retinopathy ([place])    OU  . PVD (peripheral vascular disease) ([place])     . Retinal detachment    OD   Past Surgical History:  Procedure Laterality Date  . 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 CARDIAC CATHETERIZATION    . CORONARY ANGIOPLASTY     Patient has a total of 5 stents placed.  . CORONARY ARTERY BYPASS GRAFT  1998   x1 vessel @ [facility]  . RETINAL DETACHMENT SURGERY     Laser  . STENTS     x 5   . VITREOUS RETINAL SURGERY  1985   Laser tx OU   Family History  Problem Relation Age of Onset  . Kidney disease Mother        Kidney failure  . Heart disease Father        CAD  . Cancer Brother        Head & Neck Tumor  . Diabetes Maternal Aunt   . Diabetes Maternal Uncle   . Diabetes Maternal Grandmother   . Diabetes Maternal Grandfather     Allergies: Patient has no known allergies. Current Outpatient Medications on File Prior to Visit  Medication Sig Dispense Refill  . sildenafil (VIAGRA) 100 MG tablet Take 1 tablet by mouth as needed.    Marland Kitchen aspirin EC 81 MG tablet Take 81 mg by mouth daily. Swallow whole.    . blood glucose meter  kit and supplies KIT Dispense based on patient and insurance preference. Use up to four times daily as directed. (FOR ICD-9 250.00, 250.01). 1 each 0  . clopidogrel (PLAVIX) 75 MG tablet Take 1 tablet by mouth once daily 90 tablet 2  . Continuous Blood Gluc Receiver (FREESTYLE LIBRE 14 DAY READER) DEVI USE TO TEST BLOOD SUGAR    . Continuous Blood Gluc Sensor (FREESTYLE LIBRE 14 DAY SENSOR) MISC USE 1 SENSOR EVERY 14 DAYS    . gabapentin (NEURONTIN) 300 MG capsule TAKE THREE CAPSULES BY MOUTH THREE TIMES DAILY 270 capsule 1  . insulin regular (NOVOLIN R RELION) 100 units/mL injection Inject 0.06 mLs (6 Units total) into the skin 3 (three) times daily before meals. 10 mL 2  . NONFORMULARY OR COMPOUNDED ITEM Trimix (30/1/10)-(Pap/Phent/PGE)  Test Dose 66ml vial  Qty #1 Refills 0  [facility] [phone] Fax [phone] 1 each 0  . NONFORMULARY OR COMPOUNDED ITEM Super  Trimix (30/1/50)-(Pap/Phent/PGE)  Dosage: Inject 0.04 cc per injection titrate up to 0.6, 0.8, 41ml as needed   Vial 88ml  Qty #10 vials Refills 6  [facility] [phone] Fax [phone] each 6  . simvastatin (ZOCOR) 80 MG tablet TAKE ONE TABLET BY MOUTH IN THE EVENING AT 6 PM 90 tablet 2  . TRESIBA FLEXTOUCH 100 UNIT/ML SOPN FlexTouch Pen Inject 14 Units into the skin daily.   3   No current facility-administered medications on file prior to visit.    Social History   Tobacco Use  . Smoking status: Never Smoker  . Smokeless tobacco: Never Used  Vaping Use  . Vaping Use: Never used  Substance Use Topics  . Alcohol use: No  . Drug use: No    Review of Systems  Constitutional: Negative for chills and fever.  Respiratory: Negative for cough.   Cardiovascular: Negative for chest pain and palpitations.  Gastrointestinal: Negative for nausea and vomiting.  Neurological: Negative for dizziness, tremors, weakness and headaches.      Objective:    BP 124/78   Pulse 64   Temp 97.6 F (36.4 C)   Ht 5' 5.75" (1.67 m)   Wt 212 lb 9.6 oz (96.4 kg)   SpO2 98%   BMI 34.58 kg/m  BP Readings from Last 3 Encounters:  03/04/20 124/78  02/19/20 (!) 144/78  12/03/19 128/70   Wt Readings from Last 3 Encounters:  03/04/20 212 lb 9.6 oz (96.4 kg)  12/03/19 209 lb 6 oz (95 kg)  04/30/19 202 lb 12 oz (92 kg)    Physical Exam Vitals reviewed.  Constitutional:      Appearance: He is well-developed.  HENT:     Right Ear: Hearing normal.     Left Ear: Hearing normal.     Mouth/Throat:     Pharynx: Uvula midline. No posterior oropharyngeal erythema.  Eyes:     General: Lids are normal. Lids are everted, no foreign bodies appreciated.     Conjunctiva/sclera: Conjunctivae normal.     Pupils: Pupils are equal, round, and reactive to light.     Comments: Normal fundus bilaterally.  Cardiovascular:     Rate and Rhythm: Regular rhythm.     Heart sounds: Normal heart  sounds.  Pulmonary:     Effort: Pulmonary effort is normal. No respiratory distress.     Breath sounds: Normal breath sounds. No wheezing, rhonchi or rales.  Lymphadenopathy:     Head:     Right side of head: No submental, submandibular, tonsillar,  preauricular, posterior auricular or occipital adenopathy.     Left side of head: No submental, submandibular, tonsillar, preauricular, posterior auricular or occipital adenopathy.     Cervical: No cervical adenopathy.  Skin:    General: Skin is warm and dry.  Neurological:     Mental Status: He is alert.     Cranial Nerves: No cranial nerve deficit.     Sensory: No sensory deficit.     Deep Tendon Reflexes:     Reflex Scores:      Bicep reflexes are 2+ on the right side and 2+ on the left side.      Patellar reflexes are 2+ on the right side and 2+ on the left side.    Comments: Grip equal and strong bilateral upper extremities. Gait strong and steady. Able to perform rapid alternating movement without difficulty.  Psychiatric:        Speech: Speech normal.        Behavior: Behavior normal.        Assessment & Plan:   Problem List Items Addressed This Visit      Cardiovascular and Mediastinum   CAD (coronary artery disease)    Symptomatically stable. He will continue to follow with Todd Clay.       Relevant Medications   sildenafil (VIAGRA) 100 MG tablet   Hypertension associated with diabetes ([place])    Well controlled. Unfortunately unable to tolerate RAAS medication in setting of DMI.  Will defer to endocrine for surveillance of proteinuria for DM care      Relevant Medications   sildenafil (VIAGRA) 100 MG tablet     Respiratory   OSA (obstructive sleep apnea)    Untreated. Education provided of increased risks of CVA, MI with untreated OSA. Provided pulmonology's phone number and patient verbalized that he would call to make a follow up appointment for Cipap titration. Will follow.         Endocrine   Diabetes type 1,  uncontrolled ([place])    Uncontrolled. a1c 7.3. Following with endocrine, will follow.        Other   Jerking movements of extremities - Primary    One self limiting episode. Enticing event unknown. TSH, B12 normal. Does appear similar episode had happened approx 5 years , and at that time had consult with Todd Clay. Patient declines neuroimaging today and I have placed referral back to Todd Clay.       Relevant Orders   Ambulatory referral to Neurology   CBC with Differential/Platelet (Completed)   Comprehensive metabolic panel (Completed)   TSH (Completed)    Other Visit Diagnoses    Need for immunization against influenza       Relevant Orders   Flu Vaccine QUAD High Dose(Fluad) (Completed)       I have discontinued Preet L. Dina's lisinopril and furosemide. I am also having him maintain his insulin regular, blood glucose meter kit and supplies, gabapentin, simvastatin, Tyler Aas FlexTouch, [employer] 14 Day Reader, [employer] 14 Day Sensor, NONFORMULARY OR COMPOUNDED ITEM, NONFORMULARY OR COMPOUNDED ITEM, aspirin EC, clopidogrel, and sildenafil.   No orders of the defined types were placed in this encounter.   Return precautions given.   Risks, benefits, and alternatives of the medications and treatment plan prescribed today were discussed, and patient expressed understanding.   Education regarding symptom management and diagnosis given to patient on AVS.  Continue to follow with Burnard Hawthorne, FNP for routine health maintenance.   Chee L Langenderfer and  I agreed with plan.   Mable Paris, FNP

## 2020-03-04 NOTE — Telephone Encounter (Signed)
Yes! Faxed add on sheet was sent to the lab

## 2020-03-04 NOTE — Patient Instructions (Addendum)
Please call pulmonary, Dr Ashby Dawes, to re engage and have cipap for sleep apnea prescribed Phone number below:   [phone]  Referral back to neurology  Let us know if you dont hear back within a week in regards to an appointment being scheduled.    Nice to meet you!

## 2020-03-06 ENCOUNTER — Telehealth: Payer: Self-pay

## 2020-03-06 ENCOUNTER — Other Ambulatory Visit: Payer: Self-pay | Admitting: Family

## 2020-03-06 DIAGNOSIS — Z1211 Encounter for screening for malignant neoplasm of colon: Secondary | ICD-10-CM

## 2020-03-06 DIAGNOSIS — D619 Aplastic anemia, unspecified: Secondary | ICD-10-CM

## 2020-03-06 NOTE — Telephone Encounter (Signed)
LMTCB for lab results.  

## 2020-03-09 DIAGNOSIS — G4733 Obstructive sleep apnea (adult) (pediatric): Secondary | ICD-10-CM | POA: Insufficient documentation

## 2020-03-09 NOTE — Assessment & Plan Note (Addendum)
Symptomatically stable. He will continue to follow with [name].

## 2020-03-09 NOTE — Assessment & Plan Note (Signed)
Uncontrolled. a1c 7.3. Following with endocrine, will follow.

## 2020-03-09 NOTE — Assessment & Plan Note (Signed)
Untreated. Education provided of increased risks of CVA, MI with untreated OSA. Provided pulmonology's phone number and patient verbalized that he would call to make a follow up appointment for Cipap titration. Will follow.

## 2020-03-09 NOTE — Assessment & Plan Note (Addendum)
One self limiting episode. Enticing event unknown. TSH, B12 normal. Does appear similar episode had happened approx 5 years , and at that time had consult with Dr Jaynee Eagles. Patient declines neuroimaging today and I have placed referral back to Dr Jaynee Eagles.

## 2020-03-09 NOTE — Assessment & Plan Note (Signed)
Well controlled. Unfortunately unable to tolerate RAAS medication in setting of DMI.  Will defer to endocrine for surveillance of proteinuria for DM care

## 2020-03-12 ENCOUNTER — Telehealth: Payer: Self-pay | Admitting: Family

## 2020-03-12 NOTE — Telephone Encounter (Signed)
Rejection Reason - Patient Declined" [facility] said on Mar 10, 2020 9:02 AM  On 03/10/2020 Pt's wife left message on our VM about scheduling him/returned her call, had to LVM

## 2020-03-13 ENCOUNTER — Other Ambulatory Visit: Payer: Self-pay

## 2020-03-13 NOTE — Telephone Encounter (Signed)
Stool cards at my desk.

## 2020-03-13 NOTE — Telephone Encounter (Signed)
Call pt May speak to wife if on DPR  Why did he decline neurology consult?

## 2020-03-13 NOTE — Telephone Encounter (Signed)
I spoke with patient & patient's wife as well as patient he does have appt with [facility] on 12/17. I was also able to go over labs with patient too. He will purchase iron OTC & his wife will be by to pick up stool cards on Monday.

## 2020-03-18 ENCOUNTER — Encounter: Payer: Self-pay | Admitting: *Deleted

## 2020-03-19 ENCOUNTER — Telehealth: Payer: Self-pay | Admitting: Family

## 2020-03-19 NOTE — Telephone Encounter (Signed)
Rejection Reason - Patient did not respondundefined" [facility] said on Mar 19, 2020 10:13 AM  Mailed "unable to contact" letter to home and referring provider.

## 2020-03-20 NOTE — Telephone Encounter (Signed)
I called and spoke with patient & wife. Wife was given number to GI to call to set up an appointment. She stated that it is always best to call her bc husband does not carry cell phone.

## 2020-03-20 NOTE — Telephone Encounter (Signed)
callpt Appears declines GI referral Again, this is incredibly important in setting of anemia. He will need colonoscopy and perhaps EGD to evaluate for anemia, sources of blood.   Please advise him to call GI ( they have mailed letter) and let us know once he has a scheduled appt

## 2020-04-07 ENCOUNTER — Telehealth: Payer: Self-pay | Admitting: Family

## 2020-04-07 NOTE — Telephone Encounter (Signed)
Call pt Stool cards not returned Please advise pt to return asap

## 2020-04-07 NOTE — Telephone Encounter (Signed)
Pt's wife was on her way to bring stool cards.

## 2020-04-08 ENCOUNTER — Other Ambulatory Visit (INDEPENDENT_AMBULATORY_CARE_PROVIDER_SITE_OTHER): Payer: PPO

## 2020-04-08 DIAGNOSIS — Z1211 Encounter for screening for malignant neoplasm of colon: Secondary | ICD-10-CM | POA: Diagnosis not present

## 2020-04-09 LAB — FECAL OCCULT BLOOD, IMMUNOCHEMICAL: Fecal Occult Bld: NEGATIVE

## 2020-04-09 NOTE — Addendum Note (Signed)
Addended by: Leeanne Rio on: 04/09/2020 08:14 AM   Modules accepted: Orders

## 2020-04-13 ENCOUNTER — Other Ambulatory Visit: Payer: Self-pay

## 2020-04-15 ENCOUNTER — Ambulatory Visit (INDEPENDENT_AMBULATORY_CARE_PROVIDER_SITE_OTHER): Payer: PPO | Admitting: Family

## 2020-04-15 ENCOUNTER — Encounter: Payer: Self-pay | Admitting: Family

## 2020-04-15 ENCOUNTER — Other Ambulatory Visit: Payer: Self-pay

## 2020-04-15 VITALS — BP 130/82 | HR 83 | Temp 97.8°F | Ht 65.75 in | Wt 219.0 lb

## 2020-04-15 DIAGNOSIS — I152 Hypertension secondary to endocrine disorders: Secondary | ICD-10-CM

## 2020-04-15 DIAGNOSIS — E1069 Type 1 diabetes mellitus with other specified complication: Secondary | ICD-10-CM | POA: Diagnosis not present

## 2020-04-15 DIAGNOSIS — E1065 Type 1 diabetes mellitus with hyperglycemia: Secondary | ICD-10-CM | POA: Diagnosis not present

## 2020-04-15 DIAGNOSIS — E782 Mixed hyperlipidemia: Secondary | ICD-10-CM | POA: Diagnosis not present

## 2020-04-15 DIAGNOSIS — E1159 Type 2 diabetes mellitus with other circulatory complications: Secondary | ICD-10-CM

## 2020-04-15 DIAGNOSIS — D649 Anemia, unspecified: Secondary | ICD-10-CM | POA: Diagnosis not present

## 2020-04-15 DIAGNOSIS — E1042 Type 1 diabetes mellitus with diabetic polyneuropathy: Secondary | ICD-10-CM | POA: Diagnosis not present

## 2020-04-15 DIAGNOSIS — E785 Hyperlipidemia, unspecified: Secondary | ICD-10-CM | POA: Diagnosis not present

## 2020-04-15 LAB — HEMOGLOBIN A1C: Hemoglobin A1C: 6.8

## 2020-04-15 MED ORDER — LOSARTAN POTASSIUM 25 MG PO TABS: 25.0000 mg | ORAL_TABLET | Freq: Every day | ORAL | 1 refills | Status: DC

## 2020-04-15 NOTE — Progress Notes (Signed)
Subjective:    Patient ID: Todd Clay, male    DOB: 07/01/1945, 74 y.o.   MRN: [id number]  CC: Todd Clay is a 74 y.o. male who presents today for follow up.   HPI: Has gained weight secondary to lifestyle. No orthopnea, leg swelling, sob.  Had been diuretic in the past.    DM 1 - follows with Dr Ladell Pier and seen to day a1c 6.8   Jerking movement in right arm has decreased in frequency. Upcoming appointment with Dr Leta Baptist for jerking movement.   Anemia- Negative occult cards Compliant with Ferrous sulfate 394m TID. No constipation   HLD- compliant with zocor   echo 2018 LV function low normal . Last appointment with Dr GRockey Situ7/2021 Due prevnar 13  HISTORY:  Past Medical History:  Diagnosis Date  . CAD (coronary artery disease)   . Depression   . Diabetes mellitus with complication ([place]   . Diabetic neuropathy ([place]   . History of chickenpox   . HLD (hyperlipidemia)   . HTN (hypertension)   . MI (myocardial infarction) ([place]    x3  . Osteomyelitis ([place]    Left 2nd and 3rd metatarsal  . Proliferative diabetic retinopathy ([place]    OU  . PVD (peripheral vascular disease) ([place]   . Retinal detachment    OD   Past Surgical History:  Procedure Laterality Date  . 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 CARDIAC CATHETERIZATION    . CORONARY ANGIOPLASTY     Patient has a total of 5 stents placed.  . CORONARY ARTERY BYPASS GRAFT  1998   x1 vessel @ [facility]  . RETINAL DETACHMENT SURGERY     Laser  . STENTS     x 5   . VITREOUS RETINAL SURGERY  1985   Laser tx OU   Family History  Problem Relation Age of Onset  . Kidney disease Mother        Kidney failure  . Heart disease Father        CAD  . Cancer Brother        Head & Neck Tumor  . Diabetes Maternal Aunt   . Diabetes Maternal Uncle   . Diabetes Maternal Grandmother   . Diabetes Maternal Grandfather     Allergies: Patient has no known allergies. Current  Outpatient Medications on File Prior to Visit  Medication Sig Dispense Refill  . aspirin EC 81 MG tablet Take 81 mg by mouth daily. Swallow whole.    . clopidogrel (PLAVIX) 75 MG tablet Take 1 tablet by mouth once daily 90 tablet 2  . Continuous Blood Gluc Receiver (FREESTYLE LIBRE 14 DAY READER) DEVI USE TO TEST BLOOD SUGAR    . Continuous Blood Gluc Sensor (FREESTYLE LIBRE 14 DAY SENSOR) MISC USE 1 SENSOR EVERY 14 DAYS    . gabapentin (NEURONTIN) 300 MG capsule TAKE THREE CAPSULES BY MOUTH THREE TIMES DAILY 270 capsule 1  . insulin regular (NOVOLIN R RELION) 100 units/mL injection Inject 0.06 mLs (6 Units total) into the skin 3 (three) times daily before meals. 10 mL 2  . NONFORMULARY OR COMPOUNDED ITEM Trimix (30/1/10)-(Pap/Phent/PGE)  Test Dose 369mvial  Qty #1 Refills 0  [facility] [phone] [phone] each 0  . NONFORMULARY OR COMPOUNDED ITEM Super Trimix (30/1/50)-(Pap/Phent/PGE)  Dosage: Inject 0.04 cc per injection titrate up to 0.6, 0.8, 64m64ms needed   Vial 64ml70mty #10 vials  [facility] Fax [phone] 10 each 6  . simvastatin (ZOCOR) 80 MG tablet TAKE ONE TABLET BY MOUTH IN THE EVENING AT 6 PM 90 tablet 2  . TRESIBA FLEXTOUCH 100 UNIT/ML SOPN FlexTouch Pen Inject 14 Units into the skin daily.   3   No current facility-administered medications on file prior to visit.    Social History   Tobacco Use  . Smoking status: Never Smoker  . Smokeless tobacco: Never Used  Vaping Use  . Vaping Use: Never used  Substance Use Topics  . Alcohol use: No  . Drug use: No    Review of Systems  Constitutional: Negative for chills and fever.  Respiratory: Negative for cough and shortness of breath.   Cardiovascular: Negative for chest pain, palpitations and leg swelling.  Gastrointestinal: Negative for constipation, nausea and vomiting.      Objective:    BP 130/82 (BP Location: Left Arm, Patient Position:  Sitting, Cuff Size: Normal)   Pulse 83   Temp 97.8 F (36.6 C) (Oral)   Ht 5' 5.75" (1.67 m)   Wt 219 lb (99.3 kg)   SpO2 96%   BMI 35.62 kg/m  BP Readings from Last 3 Encounters:  04/15/20 130/82  03/04/20 124/78  02/19/20 (!) 144/78   Wt Readings from Last 3 Encounters:  04/15/20 219 lb (99.3 kg)  03/04/20 212 lb 9.6 oz (96.4 kg)  12/03/19 209 lb 6 oz (95 kg)    Physical Exam Vitals reviewed.  Constitutional:      Appearance: He is well-developed.  Cardiovascular:     Rate and Rhythm: Regular rhythm.     Heart sounds: Normal heart sounds.  Pulmonary:     Effort: Pulmonary effort is normal. No respiratory distress.     Breath sounds: Normal breath sounds. No wheezing, rhonchi or rales.  Musculoskeletal:     Right lower leg: No edema.     Left lower leg: No edema.  Skin:    General: Skin is warm and dry.  Neurological:     Mental Status: He is alert.  Psychiatric:        Speech: Speech normal.        Behavior: Behavior normal.        Assessment & Plan:   Problem List Items Addressed This Visit      Cardiovascular and Mediastinum   Hypertension associated with diabetes ([place]) - Primary    Elevated. Advised to start low dose losartan 14m. Urine protein today. BMP in one week.       Relevant Medications   losartan (COZAAR) 25 MG tablet   Other Relevant Orders   Microalbumin / creatinine urine ratio   Basic metabolic panel     Endocrine   Diabetes type 1, uncontrolled ([facility])    Controlled. Follows with endocrine . Will follow      Relevant Medications   losartan (COZAAR) 25 MG tablet   Other Relevant Orders   Lipid panel     Other   Anemia    Pending cbc, iron studies. Likely stop iron. GI appointment scheduled. Will follow      Relevant Orders   CBC with Differential/Platelet   IBC + Ferritin   Hyperlipidemia    Stable. Continue zocor 851m Pending lipid panel.       Relevant Medications   losartan (COZAAR) 25 MG tablet       I have  discontinued Reise L. Curbow's blood glucose meter kit and supplies and  sildenafil. I am also having him start on losartan. Additionally, I am having him maintain his insulin regular, gabapentin, simvastatin, Tyler Aas FlexTouch, [employer] 14 Day Reader, [employer] 14 Day Sensor, NONFORMULARY OR COMPOUNDED ITEM, NONFORMULARY OR COMPOUNDED ITEM, aspirin EC, and clopidogrel.   Meds ordered this encounter  Medications  . losartan (COZAAR) 25 MG tablet    Sig: Take 1 tablet (25 mg total) by mouth daily.    Dispense:  90 tablet    Refill:  1    Order Specific Question:   Supervising Provider    Answer:   Crecencio Mc [[id number]]    Return precautions given.   Risks, benefits, and alternatives of the medications and treatment plan prescribed today were discussed, and patient expressed understanding.   Education regarding symptom management and diagnosis given to patient on AVS.  Continue to follow with Burnard Hawthorne, FNP for routine health maintenance.   Patience Musca and I agreed with plan.   Mable Paris, FNP

## 2020-04-15 NOTE — Assessment & Plan Note (Signed)
Stable. Continue zocor 80mg . Pending lipid panel.

## 2020-04-15 NOTE — Assessment & Plan Note (Signed)
Pending cbc, iron studies. Likely stop iron. GI appointment scheduled. Will follow

## 2020-04-15 NOTE — Assessment & Plan Note (Signed)
Elevated. Advised to start low dose losartan 25mg . Urine protein today. BMP in one week.

## 2020-04-15 NOTE — Assessment & Plan Note (Signed)
Controlled. Follows with endocrine . Will follow

## 2020-04-15 NOTE — Patient Instructions (Signed)
Start  Losartan 25mg  and schedule non fasting lab ONE week after starting .

## 2020-04-16 LAB — MICROALBUMIN / CREATININE URINE RATIO
Creatinine,U: 98.7 mg/dL
Microalb Creat Ratio: 35.8 mg/g — ABNORMAL HIGH (ref 0.0–30.0)
Microalb, Ur: 35.3 mg/dL — ABNORMAL HIGH (ref 0.0–1.9)

## 2020-04-16 NOTE — Progress Notes (Signed)
Patient scheduled for fasting labs & prevnar 13 12/2.

## 2020-04-16 NOTE — Progress Notes (Signed)
Patient scheduled for labs & prevanr 13 12/2.

## 2020-04-27 ENCOUNTER — Other Ambulatory Visit: Payer: PPO

## 2020-04-30 ENCOUNTER — Other Ambulatory Visit (INDEPENDENT_AMBULATORY_CARE_PROVIDER_SITE_OTHER): Payer: PPO

## 2020-04-30 ENCOUNTER — Ambulatory Visit (INDEPENDENT_AMBULATORY_CARE_PROVIDER_SITE_OTHER): Payer: PPO

## 2020-04-30 ENCOUNTER — Other Ambulatory Visit: Payer: Self-pay

## 2020-04-30 DIAGNOSIS — D649 Anemia, unspecified: Secondary | ICD-10-CM

## 2020-04-30 DIAGNOSIS — I152 Hypertension secondary to endocrine disorders: Secondary | ICD-10-CM

## 2020-04-30 DIAGNOSIS — Z23 Encounter for immunization: Secondary | ICD-10-CM

## 2020-04-30 DIAGNOSIS — E1065 Type 1 diabetes mellitus with hyperglycemia: Secondary | ICD-10-CM | POA: Diagnosis not present

## 2020-04-30 DIAGNOSIS — E1159 Type 2 diabetes mellitus with other circulatory complications: Secondary | ICD-10-CM | POA: Diagnosis not present

## 2020-04-30 LAB — CBC WITH DIFFERENTIAL/PLATELET
Basophils Absolute: 0.1 K/uL (ref 0.0–0.1)
Basophils Relative: 0.9 % (ref 0.0–3.0)
Eosinophils Absolute: 0.4 K/uL (ref 0.0–0.7)
Eosinophils Relative: 6.7 % — ABNORMAL HIGH (ref 0.0–5.0)
HCT: 39.6 % (ref 39.0–52.0)
Hemoglobin: 13.4 g/dL (ref 13.0–17.0)
Lymphocytes Relative: 36 % (ref 12.0–46.0)
Lymphs Abs: 2.3 K/uL (ref 0.7–4.0)
MCHC: 33.9 g/dL (ref 30.0–36.0)
MCV: 96.9 fl (ref 78.0–100.0)
Monocytes Absolute: 0.5 K/uL (ref 0.1–1.0)
Monocytes Relative: 7.6 % (ref 3.0–12.0)
Neutro Abs: 3 K/uL (ref 1.4–7.7)
Neutrophils Relative %: 48.8 % (ref 43.0–77.0)
Platelets: 203 K/uL (ref 150.0–400.0)
RBC: 4.08 Mil/uL — ABNORMAL LOW (ref 4.22–5.81)
RDW: 14 % (ref 11.5–15.5)
WBC: 6.2 K/uL (ref 4.0–10.5)

## 2020-04-30 LAB — LIPID PANEL
Cholesterol: 137 mg/dL (ref 0–200)
HDL: 50.2 mg/dL
LDL Cholesterol: 70 mg/dL (ref 0–99)
NonHDL: 86.91
Total CHOL/HDL Ratio: 3
Triglycerides: 85 mg/dL (ref 0.0–149.0)
VLDL: 17 mg/dL (ref 0.0–40.0)

## 2020-04-30 LAB — IBC + FERRITIN
Ferritin: 90.9 ng/mL (ref 22.0–322.0)
Iron: 72 ug/dL (ref 42–165)
Saturation Ratios: 27.2 % (ref 20.0–50.0)
Transferrin: 189 mg/dL — ABNORMAL LOW (ref 212.0–360.0)

## 2020-04-30 LAB — BASIC METABOLIC PANEL
BUN: 18 mg/dL (ref 6–23)
CO2: 30 mEq/L (ref 19–32)
Calcium: 8.8 mg/dL (ref 8.4–10.5)
Chloride: 102 mEq/L (ref 96–112)
Creatinine, Ser: 1.21 mg/dL (ref 0.40–1.50)
GFR: 59.17 mL/min — ABNORMAL LOW (ref >60.00)
Glucose, Bld: 135 mg/dL — ABNORMAL HIGH (ref 70–99)
Potassium: 4.2 mEq/L (ref 3.5–5.1)
Sodium: 141 mEq/L (ref 135–145)

## 2020-04-30 NOTE — Progress Notes (Signed)
Patient presented for prevnar 13 injection to left deltoid, patient voiced no concerns nor showed any signs of distress during injection.

## 2020-05-01 ENCOUNTER — Telehealth: Payer: Self-pay

## 2020-05-01 NOTE — Telephone Encounter (Signed)
LM on wife's cell to call back for lab results.

## 2020-05-04 NOTE — Telephone Encounter (Signed)
Returned call got labs LMTCB.

## 2020-05-04 NOTE — Telephone Encounter (Signed)
Wife returned your call about labs

## 2020-05-05 DIAGNOSIS — H3321 Serous retinal detachment, right eye: Secondary | ICD-10-CM | POA: Diagnosis not present

## 2020-05-05 DIAGNOSIS — H43812 Vitreous degeneration, left eye: Secondary | ICD-10-CM | POA: Diagnosis not present

## 2020-05-05 DIAGNOSIS — E103593 Type 1 diabetes mellitus with proliferative diabetic retinopathy without macular edema, bilateral: Secondary | ICD-10-CM | POA: Diagnosis not present

## 2020-05-05 DIAGNOSIS — H35372 Puckering of macula, left eye: Secondary | ICD-10-CM | POA: Diagnosis not present

## 2020-05-05 DIAGNOSIS — Z961 Presence of intraocular lens: Secondary | ICD-10-CM | POA: Diagnosis not present

## 2020-05-05 NOTE — Progress Notes (Signed)
Subjective:    Patient ID: Todd Clay, male    DOB: 02-23-1946, 74 y.o.   MRN: [id number]  CC: Todd Clay is a 74 y.o. male who presents today for an acute visit.    HPI: Accompanied by wife BLE leg swelling for past couple of months, gradually worsening.   Always slept propped on pillows as more comfortable this way. No cp, sob.  Not physically active which is a lifestyle he has chosen.  Normal weight has been 196- 201.   Had been on lasix in the past and stopped due to dizziness.   Complains of anxiety and anxiety attacks. Anxiety more apparent at night and he has trouble sleeping due this.  No h/o anxiety or depression. Has never been on medication on for this.  Stressor from granddaughter who moved in.  Trouble remembering appointments.   No si/hi.   Taking iron TID.     [name] 11/2019: no changes made Echo 2018 showed low normal EF 50-55%,  20 mg lasix prn in 2020  HISTORY:  Past Medical History:  Diagnosis Date  . CAD (coronary artery disease)   . Depression   . Diabetes mellitus with complication ([place])   . Diabetic neuropathy ([place])   . History of chickenpox   . HLD (hyperlipidemia)   . HTN (hypertension)   . MI (myocardial infarction) ([place])    x3  . Osteomyelitis ([place])    Left 2nd and 3rd metatarsal  . Proliferative diabetic retinopathy ([place])    OU  . PVD (peripheral vascular disease) ([place])   . Retinal detachment    OD   Past Surgical History:  Procedure Laterality Date  . 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 CARDIAC CATHETERIZATION    . CORONARY ANGIOPLASTY     Patient has a total of 5 stents placed.  . CORONARY ARTERY BYPASS GRAFT  1998   x1 vessel @ [facility]  . RETINAL DETACHMENT SURGERY     Laser  . STENTS     x 5   . VITREOUS RETINAL SURGERY  1985   Laser tx [facility]   Family History  Problem Relation Age of Onset  . Kidney disease Mother        Kidney failure  . Heart disease Father        CAD  .  Cancer Brother        Head & Neck Tumor  . Diabetes Maternal Aunt   . Diabetes Maternal Uncle   . Diabetes Maternal Grandmother   . Diabetes Maternal Grandfather     Allergies: Patient has no known allergies. Current Outpatient Medications on File Prior to Visit  Medication Sig Dispense Refill  . aspirin EC 81 MG tablet Take 81 mg by mouth daily. Swallow whole.    . clopidogrel (PLAVIX) 75 MG tablet Take 1 tablet by mouth once daily 90 tablet 2  . Continuous Blood Gluc Receiver (FREESTYLE LIBRE 14 DAY READER) DEVI USE TO TEST BLOOD SUGAR    . Continuous Blood Gluc Sensor (FREESTYLE LIBRE 14 DAY SENSOR) MISC USE 1 SENSOR EVERY 14 DAYS    . gabapentin (NEURONTIN) 300 MG capsule TAKE THREE CAPSULES BY MOUTH THREE TIMES DAILY 270 capsule 1  . insulin regular (NOVOLIN R RELION) 100 units/mL injection Inject 0.06 mLs (6 Units total) into the skin 3 (three) times daily before meals. 10 mL 2  . losartan (COZAAR) 25 MG tablet Take 1 tablet (25  mg total) by mouth daily. 90 tablet 1  . NONFORMULARY OR COMPOUNDED ITEM Trimix (30/1/10)-(Pap/Phent/PGE)  Test Dose 29ml vial  Qty #1 Refills 0  [place] [phone] Fax [phone] 1 each 0  . NONFORMULARY OR COMPOUNDED ITEM Super Trimix (30/1/50)-(Pap/Phent/PGE)  Dosage: Inject 0.04 cc per injection titrate up to 0.6, 0.8, 37ml as needed   Vial 62ml  Qty #10 vials Refills [place] [phone] Fax [phone] 10 each 6  . simvastatin (ZOCOR) 80 MG tablet TAKE ONE TABLET BY MOUTH IN THE EVENING AT 6 PM 90 tablet 2  . TRESIBA FLEXTOUCH 100 UNIT/ML SOPN FlexTouch Pen Inject 14 Units into the skin daily.   3   No current facility-administered medications on file prior to visit.    Social History   Tobacco Use  . Smoking status: Never Smoker  . Smokeless tobacco: Never Used  Vaping Use  . Vaping Use: Never used  Substance Use Topics  . Alcohol use: No  . Drug use: No    Review of Systems  Constitutional:  Negative for chills and fever.  Respiratory: Negative for cough and shortness of breath.   Cardiovascular: Positive for leg swelling. Negative for chest pain and palpitations.  Gastrointestinal: Negative for nausea and vomiting.  Psychiatric/Behavioral: Positive for sleep disturbance. Negative for suicidal ideas. The patient is nervous/anxious.       Objective:    BP 122/68   Pulse (!) 58   Temp 97.6 F (36.4 C)   Ht 5' 5.75" (1.67 m)   Wt 216 lb (98 kg)   SpO2 97%   BMI 35.13 kg/m  Wt Readings from Last 3 Encounters:  05/06/20 216 lb (98 kg)  04/15/20 219 lb (99.3 kg)  03/04/20 212 lb 9.6 oz (96.4 kg)   BP Readings from Last 3 Encounters:  05/06/20 122/68  04/15/20 130/82  03/04/20 124/78     Physical Exam Vitals reviewed.  Constitutional:      Appearance: He is well-developed.  Cardiovascular:     Rate and Rhythm: Regular rhythm.     Heart sounds: Normal heart sounds.     Comments: BLE non pitting +1 pedal edema. No palpable cords or masses. No erythema or increased warmth. No asymmetry in calf size when compared bilaterally LE hair growth symmetric and present. No discoloration or varicosities noted. LE warm and palpable pedal pulses.  Pulmonary:     Effort: Pulmonary effort is normal. No respiratory distress.     Breath sounds: Normal breath sounds. No wheezing, rhonchi or rales.  Musculoskeletal:     Right lower leg: 1+ Edema present.     Left lower leg: 1+ Edema present.  Skin:    General: Skin is warm and dry.  Neurological:     Mental Status: He is alert.  Psychiatric:        Speech: Speech normal.        Behavior: Behavior normal.        Assessment & Plan:   Problem List Items Addressed This Visit      Cardiovascular and Mediastinum   Acute congestive heart failure (HCC) - Primary    Weight increased , +1 BLE.  No crackles on exam. Restart lasix 20mg  , advised to take 2-3 days in a row and then use PRN. BMP in one week. Due for follow up with  [name] and we will make him an appointment      Relevant Medications   furosemide (LASIX) 20 MG tablet  Other Relevant Orders   Basic metabolic panel     Other   Anxiety    Uncontrolled. Start zoloft 50mg . Close follow up.       Relevant Medications   sertraline (ZOLOFT) 50 MG tablet        I am having Merrit L. Mccomb maintain his insulin regular, gabapentin, simvastatin, Tyler Aas FlexTouch, [employer] 14 Day Reader, [employer] 14 Day Sensor, NONFORMULARY OR COMPOUNDED ITEM, NONFORMULARY OR COMPOUNDED ITEM, aspirin EC, clopidogrel, and losartan.   No orders of the defined types were placed in this encounter.   Return precautions given.   Risks, benefits, and alternatives of the medications and treatment plan prescribed today were discussed, and patient expressed understanding.   Education regarding symptom management and diagnosis given to patient on AVS.  Continue to follow with Burnard Hawthorne, FNP for routine health maintenance.   Patience Musca and I agreed with plan.   Mable Paris, FNP

## 2020-05-06 ENCOUNTER — Other Ambulatory Visit: Payer: Self-pay

## 2020-05-06 ENCOUNTER — Encounter: Payer: Self-pay | Admitting: Family

## 2020-05-06 ENCOUNTER — Ambulatory Visit (INDEPENDENT_AMBULATORY_CARE_PROVIDER_SITE_OTHER): Payer: PPO | Admitting: Family

## 2020-05-06 VITALS — BP 122/68 | HR 58 | Temp 97.6°F | Ht 65.75 in | Wt 216.0 lb

## 2020-05-06 DIAGNOSIS — F419 Anxiety disorder, unspecified: Secondary | ICD-10-CM | POA: Insufficient documentation

## 2020-05-06 DIAGNOSIS — I5031 Acute diastolic (congestive) heart failure: Secondary | ICD-10-CM

## 2020-05-06 MED ORDER — FUROSEMIDE 20 MG PO TABS: 20.0000 mg | ORAL_TABLET | Freq: Every day | ORAL | 1 refills | Status: DC | PRN

## 2020-05-06 MED ORDER — SERTRALINE HCL 50 MG PO TABS: 50.0000 mg | ORAL_TABLET | Freq: Every day | ORAL | 3 refills | Status: DC

## 2020-05-06 NOTE — Patient Instructions (Addendum)
Please STOP iron. You are not anemic.   Start zoloft for anxiety  Take lasix 20mg  for the  Next 2-3 days, then use as needed  Labs in one week

## 2020-05-06 NOTE — Assessment & Plan Note (Signed)
Uncontrolled. Start zoloft 50mg . Close follow up.

## 2020-05-06 NOTE — Assessment & Plan Note (Addendum)
Weight increased , +1 BLE.  No crackles on exam. Restart lasix 20mg  , advised to take 2-3 days in a row and then use PRN. BMP in one week. Due for follow up with [name] and we will make him an appointment

## 2020-05-07 ENCOUNTER — Other Ambulatory Visit: Payer: Self-pay

## 2020-05-07 ENCOUNTER — Telehealth: Payer: Self-pay | Admitting: Family

## 2020-05-07 ENCOUNTER — Ambulatory Visit (INDEPENDENT_AMBULATORY_CARE_PROVIDER_SITE_OTHER): Payer: PPO | Admitting: Gastroenterology

## 2020-05-07 ENCOUNTER — Encounter: Payer: Self-pay | Admitting: Gastroenterology

## 2020-05-07 VITALS — BP 178/65 | HR 68 | Temp 97.7°F | Ht 65.75 in | Wt 215.4 lb

## 2020-05-07 DIAGNOSIS — E1065 Type 1 diabetes mellitus with hyperglycemia: Secondary | ICD-10-CM

## 2020-05-07 DIAGNOSIS — D649 Anemia, unspecified: Secondary | ICD-10-CM | POA: Diagnosis not present

## 2020-05-07 DIAGNOSIS — O24019 Pre-existing diabetes mellitus, type 1, in pregnancy, unspecified trimester: Secondary | ICD-10-CM

## 2020-05-07 NOTE — Progress Notes (Signed)
Gastroenterology Consultation  Referring Provider:     Burnard Hawthorne, FNP Primary Care Physician:  Burnard Hawthorne, FNP Primary Gastroenterologist:  Dr. [name]     Reason for Consultation:     In need of a screening colonoscopy        HPI:   Todd Clay is a 74 y.o. y/o male referred for consultation & management of In need of a screening colonoscopy by Dr. Vidal Schwalbe, Yvetta Coder, FNP.  This patient comes in today with a history of congestive heart failure and diabetes since he is 74 years old.  The patient was on iron because he states that he had been told his iron level was low but then told to stop iron because it had come back to normal.  The patient denies any black stools or bloody stools.  He is concerned about his colonoscopy since he has to stop take insulin 4 times a day.  He is looking for guidance on what to do with his insulin.  The patient is also on Plavix. The patient denies ever having a colonoscopy in the past.  He denies any abdominal pain nausea vomiting fevers chills black stools or bloody stools.  There is no report of any abdominal pain.  He also denies any family history of colon cancer or colon polyps.  Past Medical History:  Diagnosis Date  . CAD (coronary artery disease)   . Depression   . Diabetes mellitus with complication ([place])   . Diabetic neuropathy ([place])   . History of chickenpox   . HLD (hyperlipidemia)   . HTN (hypertension)   . MI (myocardial infarction) ([place])    x3  . Osteomyelitis ([place])    Left 2nd and 3rd metatarsal  . Proliferative diabetic retinopathy ([place])    OU  . PVD (peripheral vascular disease) ([place])   . Retinal detachment    OD    Past Surgical History:  Procedure Laterality Date  . 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 CARDIAC CATHETERIZATION    . CORONARY ANGIOPLASTY     Patient has a total of 5 stents placed.  . CORONARY ARTERY BYPASS GRAFT  1998   x1 vessel @ [facility]  . RETINAL DETACHMENT  SURGERY     Laser  . STENTS     x 5   . VITREOUS RETINAL SURGERY  1985   Laser tx [facility]    Prior to Admission medications   Medication Sig Start Date End Date Taking? Authorizing Provider  aspirin EC 81 MG tablet Take 81 mg by mouth daily. Swallow whole.   Yes [provider]  clopidogrel (PLAVIX) 75 MG tablet Take 1 tablet by mouth once daily 12/19/19  Yes Gollan, Kathlene November, MD  Continuous Blood Gluc Receiver (FREESTYLE LIBRE 14 DAY READER) DEVI USE TO TEST BLOOD SUGAR 10/12/18  Yes [provider]  Continuous Blood Gluc Sensor (FREESTYLE LIBRE 14 DAY SENSOR) MISC USE 1 SENSOR EVERY 14 DAYS 10/29/18  Yes [provider]  gabapentin (NEURONTIN) 300 MG capsule TAKE THREE CAPSULES BY MOUTH THREE TIMES DAILY 09/21/15  Yes Doss, Velora Heckler, RN  insulin regular (NOVOLIN R RELION) 100 units/mL injection Inject 0.06 mLs (6 Units total) into the skin 3 (three) times daily before meals. 06/19/14  Yes Phadke, Karsten Ro, MD  losartan (COZAAR) 25 MG tablet Take 1 tablet (25 mg total) by mouth daily. 04/15/20  Yes Burnard Hawthorne, FNP  NONFORMULARY OR  COMPOUNDED ITEM Trimix (30/1/10)-(Pap/Phent/PGE)  Test Dose 71ml vial  Qty #1 [place] [phone] Fax [phone] 01/07/19  Yes Stoioff, Ronda Fairly, MD  NONFORMULARY OR COMPOUNDED ITEM Super Trimix (30/1/50)-(Pap/Phent/PGE)  Dosage: Inject 0.04 cc per injection titrate up to 0.6, 0.8, 31ml as needed   Vial 46ml  Qty #10 vials Refills [place] [phone] Fax [phone] 02/05/19  Yes McGowan, Larene Beach A, PA-C  simvastatin (ZOCOR) 80 MG tablet TAKE ONE TABLET BY MOUTH IN THE EVENING AT 6 PM 12/26/16  Yes Cook, Jayce G, DO  TRESIBA FLEXTOUCH 100 UNIT/ML SOPN FlexTouch Pen Inject 14 Units into the skin daily.  12/21/17  Yes [provider]  furosemide (LASIX) 20 MG tablet Take 1 tablet (20 mg total) by mouth daily as needed. Patient not taking: Reported on 05/07/2020 05/06/20   Burnard Hawthorne, FNP  sertraline (ZOLOFT) 50 MG tablet Take 1 tablet (50 mg total) by mouth at bedtime. 05/06/20   Burnard Hawthorne, FNP    Family History  Problem Relation Age of Onset  . Kidney disease Mother        Kidney failure  . Heart disease Father        CAD  . Cancer Brother        Head & Neck Tumor  . Diabetes Maternal Aunt   . Diabetes Maternal Uncle   . Diabetes Maternal Grandmother   . Diabetes Maternal Grandfather      Social History   Tobacco Use  . Smoking status: Never Smoker  . Smokeless tobacco: Never Used  Vaping Use  . Vaping Use: Never used  Substance Use Topics  . Alcohol use: No  . Drug use: No    Allergies as of 05/07/2020  . (No Known Allergies)    Review of Systems:    All systems reviewed and negative except where noted in HPI.   Physical Exam:  BP (!) 178/65   Pulse 68   Temp 97.7 F (36.5 C) (Oral)   Ht 5' 5.75" (1.67 m)   Wt 215 lb 6 oz (97.7 kg)   BMI 35.03 kg/m  No LMP for male patient. General:   Alert,  Well-developed, well-nourished, pleasant and cooperative in NAD Head:  Normocephalic and atraumatic. Eyes:  Sclera clear, no icterus.   Conjunctiva pink. Ears:  Normal auditory acuity. Neck:  Supple; no masses or thyromegaly. Lungs:  Respirations even and unlabored.  Clear throughout to auscultation.   No wheezes, crackles, or rhonchi. No acute distress. Heart:  Regular rate and rhythm; no murmurs, clicks, rubs, or gallops. Abdomen:  Normal bowel sounds.  No bruits.  Soft, non-tender and non-distended without masses, hepatosplenomegaly or hernias noted.  No guarding or rebound tenderness.  Negative Carnett sign.   Rectal:  Deferred.  Pulses:  Normal pulses noted. Extremities:  No clubbing or edema.  No cyanosis. Neurologic:  Alert and oriented x3;  grossly normal neurologically. Skin:  Intact without significant lesions or rashes.  No jaundice. Lymph Nodes:  No significant cervical adenopathy. Psych:  Alert and cooperative.  Normal mood and affect.  Imaging Studies: No results found.  Assessment and Plan:   Todd Clay is a 74 y.o. y/o male who comes in with a history of diabetes since he's 74 years old.  The patient will need instructions on what to do with his insulin on the day before the procedure and the day of the procedure. We will also get clearance for him  to stop his Plavix prior to the procedure.  The patient has been explained the plan and agrees with it.    Lucilla Lame, MD. Marval Regal    Note: This dictation was prepared with Dragon dictation along with smaller phrase technology. Any transcriptional errors that result from this process are unintentional.

## 2020-05-07 NOTE — Telephone Encounter (Signed)
Patient's wife wanted a call back about the referral need a call back before 12-17

## 2020-05-08 ENCOUNTER — Telehealth: Payer: Self-pay | Admitting: Student

## 2020-05-08 ENCOUNTER — Other Ambulatory Visit: Payer: Self-pay

## 2020-05-08 DIAGNOSIS — D649 Anemia, unspecified: Secondary | ICD-10-CM

## 2020-05-08 NOTE — Telephone Encounter (Signed)
LMTCB

## 2020-05-08 NOTE — Telephone Encounter (Signed)
Pt's wife called & wanted to know what your opinion was on her canceling appointment with neurology? She said that they had figured out patient had only been jerking when his blood sugar was very low. Pt has a lot going on & doesn't know if this appointment or seeing neurology period will be beneficial?

## 2020-05-08 NOTE — Telephone Encounter (Signed)
   Primary Cardiologist: Dr. Rockey Situ  Chart reviewed as part of pre-operative protocol coverage. Received clearance form under CC'd Charts. Patient is scheduled for a colonoscopy on 05/14/2020. Contacted patient today for further pre-op evaluation and patient reported doing well since last office visit with Dr. Rockey Situ in 11/2019. No chest pain, significant shortness of breath, palpitations, lightheadedness, dizziness, or syncope. Patient has had some lower extremity edema recently for which PCP recently started Lasix. Patient states he eats out a lot so recommended low sodium diet. No other acute CHF symptoms. Patient is mostly sedentary but he is able to complete >4.0 METS without angina. Given past medical history and time since last visit, based on ACC/AHA guidelines, Todd Clay would be at acceptable risk for the planned procedure without further cardiovascular testing.   Discussed with DOD (Dr. Acie Fredrickson) given request was not noticed until very end of the day who stated Plavix could be held for 5 days prior to procedure, which means patient would start hold tomorrow. Discussed this with patient. Please restart Plavix as soon as able following procedure.   I will route this recommendation to the requesting party (Dr. [name] at [facility]) via [facility] fax function and remove from pre-op pool.  Please call with questions.  Todd Mclean, PA-C 05/08/2020, 6:00 PM

## 2020-05-10 NOTE — Progress Notes (Signed)
Will cc preop pool

## 2020-05-11 ENCOUNTER — Telehealth: Payer: Self-pay | Admitting: Family

## 2020-05-11 NOTE — Telephone Encounter (Signed)
I spoke with patient's wife & she stated that she has pushed patient's appointment out to 2/8. She will continue to observe patient & see if she feels the appointment is necessary. Based on this she will keep or cancel appointment.

## 2020-05-11 NOTE — Telephone Encounter (Signed)
patient was returning call for results

## 2020-05-11 NOTE — Telephone Encounter (Signed)
LM for patients wife to call back.

## 2020-05-11 NOTE — Telephone Encounter (Signed)
I have returned patients wife call.

## 2020-05-11 NOTE — Telephone Encounter (Signed)
Call wife and patient  Wife and patient had been concerned with seizure activity and told me at that the time NOT related to hypoglycemia. I would advise to keep appt however if they feel strongly related to DM, they may cancel

## 2020-05-12 ENCOUNTER — Other Ambulatory Visit
Admission: RE | Admit: 2020-05-12 | Discharge: 2020-05-12 | Disposition: A | Discharge status: Home / Self Care | Payer: PPO | Source: Ambulatory Visit | Attending: Gastroenterology | Admitting: Gastroenterology

## 2020-05-12 ENCOUNTER — Other Ambulatory Visit: Payer: Self-pay

## 2020-05-12 DIAGNOSIS — Z01812 Encounter for preprocedural laboratory examination: Secondary | ICD-10-CM | POA: Insufficient documentation

## 2020-05-12 DIAGNOSIS — Z20822 Contact with and (suspected) exposure to covid-19: Secondary | ICD-10-CM | POA: Insufficient documentation

## 2020-05-12 LAB — SARS CORONAVIRUS 2 (TAT 6-24 HRS): SARS Coronavirus 2: NEGATIVE

## 2020-05-12 NOTE — Progress Notes (Signed)
I have called & left message with patient's wife that patient is scheduled 06/19/20 with Dr. Rockey Situ for f/u.

## 2020-05-13 ENCOUNTER — Other Ambulatory Visit: Payer: PPO

## 2020-05-14 ENCOUNTER — Ambulatory Visit: Admission: RE | Admit: 2020-05-14 | Payer: PPO | Source: Home / Self Care | Admitting: Gastroenterology

## 2020-05-14 ENCOUNTER — Encounter: Admission: RE | Payer: Self-pay | Source: Home / Self Care

## 2020-05-14 SURGERY — COLONOSCOPY WITH PROPOFOL
Anesthesia: General

## 2020-05-15 ENCOUNTER — Ambulatory Visit: Payer: PPO | Admitting: Diagnostic Neuroimaging

## 2020-05-18 ENCOUNTER — Other Ambulatory Visit: Payer: PPO

## 2020-05-18 ENCOUNTER — Other Ambulatory Visit: Payer: Self-pay

## 2020-05-18 ENCOUNTER — Other Ambulatory Visit (INDEPENDENT_AMBULATORY_CARE_PROVIDER_SITE_OTHER): Payer: PPO

## 2020-05-18 DIAGNOSIS — I5031 Acute diastolic (congestive) heart failure: Secondary | ICD-10-CM

## 2020-05-19 LAB — BASIC METABOLIC PANEL
BUN: 26 mg/dL — ABNORMAL HIGH (ref 6–23)
CO2: 30 mEq/L (ref 19–32)
Calcium: 8.9 mg/dL (ref 8.4–10.5)
Chloride: 102 mEq/L (ref 96–112)
Creatinine, Ser: 1.25 mg/dL (ref 0.40–1.50)
GFR: 56.88 mL/min — ABNORMAL LOW (ref >60.00)
Glucose, Bld: 195 mg/dL — ABNORMAL HIGH (ref 70–99)
Potassium: 4.7 mEq/L (ref 3.5–5.1)
Sodium: 138 mEq/L (ref 135–145)

## 2020-06-09 ENCOUNTER — Ambulatory Visit: Payer: PPO | Admitting: Family

## 2020-06-09 ENCOUNTER — Other Ambulatory Visit: Payer: Self-pay

## 2020-06-09 ENCOUNTER — Encounter: Payer: Self-pay | Admitting: Family

## 2020-06-09 DIAGNOSIS — Z1211 Encounter for screening for malignant neoplasm of colon: Secondary | ICD-10-CM

## 2020-06-09 DIAGNOSIS — I5031 Acute diastolic (congestive) heart failure: Secondary | ICD-10-CM

## 2020-06-09 DIAGNOSIS — F419 Anxiety disorder, unspecified: Secondary | ICD-10-CM | POA: Diagnosis not present

## 2020-06-09 NOTE — Patient Instructions (Signed)
We have ordered cologuard for you Please let us know if you do not receive a Cologuard Kit in the next month.    Nice to see you!

## 2020-06-09 NOTE — Progress Notes (Signed)
Done

## 2020-06-09 NOTE — Progress Notes (Signed)
Subjective:    Patient ID: Todd Clay, male    DOB: 1945-07-25, 75 y.o.   MRN: [id number]  CC: Todd Clay is a 75 y.o. male who presents today for follow up.   HPI: Feels well today No new complaints  Anxiety is 'much better'. Compliant with zoloft 50mg . Sleeping well. No anxiety attacks. No si/hi.   CHF- resumed lasix 20mg . Leg swelling improved. No sob, cp. Follow up with [name] next week.   He canceled colonoscopy with Dr [name] as concerned about drinking the prep and in particular his blood sugar as DM 1. Never had a colonscopy before and has decided not to pursue.  No family h/o colon cancer. No changes in bowel habits.   Anemia resolved. No longer on iron.       HISTORY:  Past Medical History:  Diagnosis Date   CAD (coronary artery disease)    Depression    Diabetes mellitus with complication (HCC)    Diabetic neuropathy ([place])    History of chickenpox    HLD (hyperlipidemia)    HTN (hypertension)    MI (myocardial infarction) ([place])    x3   Osteomyelitis ([place])    Left 2nd and 3rd metatarsal   Proliferative diabetic retinopathy ([place])    OU   PVD (peripheral vascular disease) ([place])    Retinal detachment    OD   Past Surgical History:  Procedure Laterality Date   AMPUTATION OF REPLICATED TOES     Left 2nd and 3rd metatarsal   CARDIAC CATHETERIZATION     CORONARY ANGIOPLASTY     Patient has a total of 5 stents placed.   CORONARY ARTERY BYPASS GRAFT  1998   x1 vessel @ [facility]   RETINAL DETACHMENT SURGERY     Laser   STENTS     x 5    VITREOUS RETINAL SURGERY  1985   Laser tx [facility]   Family History  Problem Relation Age of Onset   Kidney disease Mother        Kidney failure   Heart disease Father        CAD   Cancer Brother        Head & Neck Tumor   Diabetes Maternal Aunt    Diabetes Maternal Uncle    Diabetes Maternal Grandmother    Diabetes Maternal Grandfather     Allergies: Patient has no known  allergies. Current Outpatient Medications on File Prior to Visit  Medication Sig Dispense Refill   aspirin EC 81 MG tablet Take 81 mg by mouth daily. Swallow whole.     clopidogrel (PLAVIX) 75 MG tablet Take 1 tablet by mouth once daily 90 tablet 2   Continuous Blood Gluc Receiver (FREESTYLE LIBRE 14 DAY READER) DEVI USE TO TEST BLOOD SUGAR     Continuous Blood Gluc Sensor (FREESTYLE LIBRE 14 DAY SENSOR) MISC USE 1 SENSOR EVERY 14 DAYS     furosemide (LASIX) 20 MG tablet Take 1 tablet (20 mg total) by mouth daily as needed. 90 tablet 1   gabapentin (NEURONTIN) 300 MG capsule TAKE THREE CAPSULES BY MOUTH THREE TIMES DAILY 270 capsule 1   insulin regular (NOVOLIN R RELION) 100 units/mL injection Inject 0.06 mLs (6 Units total) into the skin 3 (three) times daily before meals. 10 mL 2   losartan (COZAAR) 25 MG tablet Take 1 tablet (25 mg total) by mouth daily. 90 tablet 1   NONFORMULARY OR COMPOUNDED ITEM Trimix (30/1/10)-(Pap/Phent/PGE)  Test Dose 31ml  vial  Qty #1 [place] [phone] Fax [phone] 1 each 0   NONFORMULARY OR COMPOUNDED ITEM Super Trimix (30/1/50)-(Pap/Phent/PGE)  Dosage: Inject 0.04 cc per injection titrate up to 0.6, 0.8, 6ml as needed   Vial 75ml  Qty #10 vials Refills [place] [phone] Fax [phone] 10 each 6   sertraline (ZOLOFT) 50 MG tablet Take 1 tablet (50 mg total) by mouth at bedtime. 90 tablet 3   simvastatin (ZOCOR) 80 MG tablet TAKE ONE TABLET BY MOUTH IN THE EVENING AT 6 PM 90 tablet 2   TRESIBA FLEXTOUCH 100 UNIT/ML SOPN FlexTouch Pen Inject 14 Units into the skin daily.   3   No current facility-administered medications on file prior to visit.    Social History   Tobacco Use   Smoking status: Never Smoker   Smokeless tobacco: Never Used  Vaping Use   Vaping Use: Never used  Substance Use Topics   Alcohol use: No   Drug use: No    Review of Systems  Constitutional:  Negative for chills and fever.  Respiratory: Negative for cough and shortness of breath.   Cardiovascular: Negative for chest pain, palpitations and leg swelling.  Gastrointestinal: Negative for nausea and vomiting.  Psychiatric/Behavioral: Negative for sleep disturbance and suicidal ideas. The patient is not nervous/anxious (improved).       Objective:    BP 110/70    Pulse 69    Temp 98 F (36.7 C)    Ht 5' 5.75" (1.67 m)    Wt 208 lb 3.2 oz (94.4 kg)    SpO2 97%    BMI 33.86 kg/m  BP Readings from Last 3 Encounters:  06/09/20 110/70  05/07/20 (!) 178/65  05/06/20 122/68   Wt Readings from Last 3 Encounters:  06/09/20 208 lb 3.2 oz (94.4 kg)  05/07/20 215 lb 6 oz (97.7 kg)  05/06/20 216 lb (98 kg)    Physical Exam Vitals reviewed.  Constitutional:      Appearance: He is well-developed and well-nourished.  Cardiovascular:     Rate and Rhythm: Regular rhythm.     Heart sounds: Normal heart sounds.  Pulmonary:     Effort: Pulmonary effort is normal. No respiratory distress.     Breath sounds: Normal breath sounds. No wheezing, rhonchi or rales.  Musculoskeletal:     Right lower leg: No edema.     Left lower leg: No edema.  Skin:    General: Skin is warm and dry.  Neurological:     Mental Status: He is alert.  Psychiatric:        Mood and Affect: Mood and affect normal.        Speech: Speech normal.        Behavior: Behavior normal.        Assessment & Plan:   Problem List Items Addressed This Visit      Cardiovascular and Mediastinum   Acute congestive heart failure ([place])    Appears euvolemic today. No edema, electrolytes stable 3 weeks ago. Continue lasix 20mg .         Other   Anxiety    Improved. Continue zoloft 50mg .       Screen for colon cancer    CMA [name] has ordered Cologuard study for patient. Counseled on if positive, he would need diagnostic colonoscopy and in my experience insurance will not cover at 100% diagnostic colonoscopy. He verbalized  understanding of all  I am having [name] maintain his insulin regular, gabapentin, simvastatin, Tresiba FlexTouch, [employer] 14 Day Reader, [employer] 14 Day Sensor, NONFORMULARY OR COMPOUNDED ITEM, NONFORMULARY OR COMPOUNDED ITEM, aspirin EC, clopidogrel, losartan, furosemide, and sertraline.   No orders of the defined types were placed in this encounter.   Return precautions given.   Risks, benefits, and alternatives of the medications and treatment plan prescribed today were discussed, and patient expressed understanding.   Education regarding symptom management and diagnosis given to patient on AVS.  Continue to follow with Burnard Hawthorne, FNP for routine health maintenance.   Patience Musca and I agreed with plan.   Mable Paris, FNP

## 2020-06-10 NOTE — Assessment & Plan Note (Addendum)
Appears euvolemic today. No edema, electrolytes stable 3 weeks ago. Continue lasix 20mg .

## 2020-06-10 NOTE — Assessment & Plan Note (Signed)
CMA Todd Clay has ordered Cologuard study for patient. Counseled on if positive, he would need diagnostic colonoscopy and in my experience insurance will not cover at 100% diagnostic colonoscopy. He verbalized understanding of all

## 2020-06-10 NOTE — Assessment & Plan Note (Signed)
Improved. Continue zoloft 50mg .

## 2020-06-11 ENCOUNTER — Telehealth: Payer: Self-pay | Admitting: *Deleted

## 2020-06-11 DIAGNOSIS — R899 Unspecified abnormal finding in specimens from other organs, systems and tissues: Secondary | ICD-10-CM

## 2020-06-11 NOTE — Telephone Encounter (Signed)
Please place future orders for lab appt.  

## 2020-06-13 DIAGNOSIS — E785 Hyperlipidemia, unspecified: Secondary | ICD-10-CM | POA: Diagnosis not present

## 2020-06-13 DIAGNOSIS — E1042 Type 1 diabetes mellitus with diabetic polyneuropathy: Secondary | ICD-10-CM | POA: Diagnosis not present

## 2020-06-13 DIAGNOSIS — I10 Essential (primary) hypertension: Secondary | ICD-10-CM | POA: Diagnosis not present

## 2020-06-13 DIAGNOSIS — I2510 Atherosclerotic heart disease of native coronary artery without angina pectoris: Secondary | ICD-10-CM | POA: Diagnosis not present

## 2020-06-13 DIAGNOSIS — F419 Anxiety disorder, unspecified: Secondary | ICD-10-CM | POA: Diagnosis not present

## 2020-06-13 DIAGNOSIS — Z89421 Acquired absence of other right toe(s): Secondary | ICD-10-CM | POA: Diagnosis not present

## 2020-06-13 DIAGNOSIS — D8481 Immunodeficiency due to conditions classified elsewhere: Secondary | ICD-10-CM | POA: Diagnosis not present

## 2020-06-13 DIAGNOSIS — E669 Obesity, unspecified: Secondary | ICD-10-CM | POA: Diagnosis not present

## 2020-06-13 DIAGNOSIS — Z89422 Acquired absence of other left toe(s): Secondary | ICD-10-CM | POA: Diagnosis not present

## 2020-06-15 ENCOUNTER — Other Ambulatory Visit: Payer: PPO

## 2020-06-17 ENCOUNTER — Other Ambulatory Visit: Payer: Medicare PPO

## 2020-06-19 ENCOUNTER — Ambulatory Visit: Payer: Medicare PPO | Admitting: Cardiovascular Disease

## 2020-06-24 ENCOUNTER — Other Ambulatory Visit: Payer: Self-pay

## 2020-06-24 ENCOUNTER — Other Ambulatory Visit (INDEPENDENT_AMBULATORY_CARE_PROVIDER_SITE_OTHER): Payer: Medicare PPO

## 2020-06-24 DIAGNOSIS — R899 Unspecified abnormal finding in specimens from other organs, systems and tissues: Secondary | ICD-10-CM

## 2020-06-24 LAB — CBC WITH DIFFERENTIAL/PLATELET
Basophils Absolute: 0.1 10*3/uL (ref 0.0–0.1)
Basophils Relative: 1 % (ref 0.0–3.0)
Eosinophils Absolute: 0.5 10*3/uL (ref 0.0–0.7)
Eosinophils Relative: 6.4 % — ABNORMAL HIGH (ref 0.0–5.0)
HCT: 41 % (ref 39.0–52.0)
Hemoglobin: 13.6 g/dL (ref 13.0–17.0)
Lymphocytes Relative: 33.7 % (ref 12.0–46.0)
Lymphs Abs: 2.6 10*3/uL (ref 0.7–4.0)
MCHC: 33.3 g/dL (ref 30.0–36.0)
MCV: 92.9 fl (ref 78.0–100.0)
Monocytes Absolute: 0.5 10*3/uL (ref 0.1–1.0)
Monocytes Relative: 6 % (ref 3.0–12.0)
Neutro Abs: 4.1 10*3/uL (ref 1.4–7.7)
Neutrophils Relative %: 52.9 % (ref 43.0–77.0)
Platelets: 187 10*3/uL (ref 150.0–400.0)
RBC: 4.41 Mil/uL (ref 4.22–5.81)
RDW: 13.1 % (ref 11.5–15.5)
WBC: 7.8 10*3/uL (ref 4.0–10.5)

## 2020-06-25 ENCOUNTER — Other Ambulatory Visit: Payer: Self-pay

## 2020-06-25 DIAGNOSIS — D649 Anemia, unspecified: Secondary | ICD-10-CM

## 2020-07-10 ENCOUNTER — Telehealth: Payer: Self-pay | Admitting: Family

## 2020-07-10 NOTE — Telephone Encounter (Signed)
Correction: this should be scheduled as AWV-S ([id number])

## 2020-07-10 NOTE — Telephone Encounter (Signed)
Left message for patient to call back and schedule Medicare Annual Wellness Visit (AWV)   This should be a virtual visit only=30 minutes.  No hx of AWV; please schedule at anytime with Denisa O'Brien-Blaney at [facility] [place]  AWV-I PER [place] AS OF 05/30/2009

## 2020-07-11 NOTE — Progress Notes (Signed)
Cardiology Office Note  Date:  07/13/2020   ID:  Todd Clay, DOB 1946/04/24, MRN [id number]  PCP:  Burnard Hawthorne, FNP   Chief Complaint  Patient presents with  . Follow-up    6 month F/U    HPI:  Todd Clay is a pleasant 75 year old gentleman with history of  CAD,  CABG December 1997  LIMA to the LAD at [facility],  several catheterizations since that time with stents placed,  stent to the left circumflex and RCA in 2005,  Hx of poorly controlled diabetes, with PAD toe  ostiomyelitis requiring PICC line and long-term antibiotics,  Prior history of orthostasis Carotid u/s 40 to 59% on the left Chronic Diastolic CHF PVCs and bigeminal pattern in the past 2 office visits who presents for routine followup of his coronary artery disease, leg swelling and shortness of breath  Last seen in clinic by myself July 2021  Feels well, Weight down 10 pounds No regular exercise Eating better  Walking "ok", denies chest pain concerning for angina No significant shortness of breath No leg swelling, no PND orthopnea  Lab work reviewed in detail HBA1c 6.8 Total chol 137,LDL 70 CR 1.25  EKG personally reviewed by myself on todays visit Shows normal sinus rhythm with rate 66 bpm PVCs  Past medical history reviewed Echocardiogram  showing preserved LV function, diastolic relaxation abnormality  Cardiac catheterization in 2008 at [facility] showed 99% proximal RCA disease with endeavor DES placed 2.5 x 15 mm, also with 60% distal RCA disease, 100% OM1 disease followed by 50%, 80% proximal LAD disease, 100% followed by 90% mid LAD disease, 100% diagonal disease Normal ejection fraction at that time estimated greater than 60%  Prior cardiac catheterization in July 2005 details 60% proximal RCA followed by 50%, 90% and 70% distal RCA disease 90% mid left circumflex disease, 90% OM 3 disease, 70% proximal LAD disease extending to the mid vessel, 100% mid LAD disease, 70% distal  LAD disease  Cardiac catheterization July 2000 with 70% proximal RCA disease, 90% mid RCA disease, 70% distal RCA disease, 70% proximal left circumflex disease, 70% OM1 disease, 100% OM 2 disease, 70% proximal LAD to mid LAD disease, 70% mid and distal LAD disease, 100% diagonal disease    PMH:   has a past medical history of CAD (coronary artery disease), Depression, Diabetes mellitus with complication ([place]), Diabetic neuropathy ([place]), History of chickenpox, HLD (hyperlipidemia), HTN (hypertension), MI (myocardial infarction) ([place]), Osteomyelitis ([place]), Proliferative diabetic retinopathy ([place]), PVD (peripheral vascular disease) ([place]), and Retinal detachment.  PSH:    Past Surgical History:  Procedure Laterality Date  . 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 CARDIAC CATHETERIZATION    . CORONARY ANGIOPLASTY     Patient has a total of 5 stents placed.  . CORONARY ARTERY BYPASS GRAFT  1998   x1 vessel @ [facility]  . RETINAL DETACHMENT SURGERY     Laser  . STENTS     x 5   . VITREOUS RETINAL SURGERY  1985   Laser tx [facility]    Current Outpatient Medications  Medication Sig Dispense Refill  . aspirin EC 81 MG tablet Take 81 mg by mouth daily. Swallow whole.    . clopidogrel (PLAVIX) 75 MG tablet Take 1 tablet by mouth once daily 90 tablet 2  . Continuous Blood Gluc Receiver (FREESTYLE LIBRE 14 DAY READER) DEVI USE TO TEST BLOOD SUGAR    . Continuous Blood Gluc Sensor (  FREESTYLE LIBRE 14 DAY SENSOR) MISC USE 1 SENSOR EVERY 14 DAYS    . furosemide (LASIX) 20 MG tablet Take 1 tablet (20 mg total) by mouth daily as needed. 90 tablet 1  . gabapentin (NEURONTIN) 300 MG capsule TAKE THREE CAPSULES BY MOUTH THREE TIMES DAILY 270 capsule 1  . insulin regular (NOVOLIN R RELION) 100 units/mL injection Inject 0.06 mLs (6 Units total) into the skin 3 (three) times daily before meals. 10 mL 2  . losartan (COZAAR) 25 MG tablet Take 1 tablet (25 mg total) by mouth daily. 90 tablet 1   . sertraline (ZOLOFT) 50 MG tablet Take 1 tablet (50 mg total) by mouth at bedtime. 90 tablet 3  . simvastatin (ZOCOR) 80 MG tablet TAKE ONE TABLET BY MOUTH IN THE EVENING AT 6 PM 90 tablet 2  . TRESIBA FLEXTOUCH 100 UNIT/ML SOPN FlexTouch Pen Inject 14 Units into the skin daily.   3   No current facility-administered medications for this visit.    Allergies:   Patient has no known allergies.   Social History:  The patient  reports that he has never smoked. He has never used smokeless tobacco. He reports that he does not drink alcohol and does not use drugs.   Family History:   family history includes Cancer in his brother; Diabetes in his maternal aunt, maternal grandfather, maternal grandmother, and maternal uncle; Heart disease in his father; Kidney disease in his mother.    Review of Systems: Review of Systems  Constitutional: Negative.   HENT: Negative.   Respiratory: Negative.   Cardiovascular: Negative.   Gastrointestinal: Negative.   Musculoskeletal: Negative.   Neurological: Negative.   Psychiatric/Behavioral: Negative.   All other systems reviewed and are negative.   PHYSICAL EXAM: VS:  BP 120/62 (BP Location: Left Arm, Patient Position: Sitting, Cuff Size: Large)   Pulse 66   Ht 5\' 8"  (1.727 m)   Wt 205 lb (93 kg)   SpO2 95%   BMI 31.17 kg/m  , BMI Body mass index is 31.17 kg/m.  Constitutional:  oriented to person, place, and time. No distress.  HENT:  Head: Grossly normal Eyes:  no discharge. No scleral icterus.  Neck: No JVD, no carotid bruits  Cardiovascular: Regular rate and rhythm, no murmurs appreciated Pulmonary/Chest: Clear to auscultation bilaterally, no wheezes or rails Abdominal: Soft.  no distension.  no tenderness.  Musculoskeletal: Normal range of motion Neurological:  normal muscle tone. Coordination normal. No atrophy Skin: Skin warm and dry Psychiatric: normal affect, pleasant   Recent Labs: 03/04/2020: ALT 14; TSH 1.29 05/18/2020: BUN  26; Creatinine, Ser 1.25; Potassium 4.7; Sodium 138 06/24/2020: Hemoglobin 13.6; Platelets 187.0    Lipid Panel Lab Results  Component Value Date   CHOL 137 04/30/2020   HDL 50.20 04/30/2020   LDLCALC 70 04/30/2020   TRIG 85.0 04/30/2020      Wt Readings from Last 3 Encounters:  07/13/20 205 lb (93 kg)  06/09/20 208 lb 3.2 oz (94.4 kg)  05/07/20 215 lb 6 oz (97.7 kg)     ASSESSMENT AND PLAN:  Chronic CHF, diastolic  euvolemic, no swelling Lasix qod  Hx of CABG - Currently with no symptoms of angina. No further workup at this time. Continue current medication regimen. 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 No changes to the medications were made.  PVCs Asymptomatic Rare on EKG  Anxiety Doing better on zoloft    Total encounter time more than 25 minutes  Greater than 50% was spent in counseling and coordination of care with the patient   Orders Placed This Encounter  Procedures  . EKG 12-Lead     Signed, Esmond Plants, M.D., Ph.D. 07/13/2020  [place], [place]

## 2020-07-13 ENCOUNTER — Encounter: Payer: Self-pay | Admitting: Cardiovascular Disease

## 2020-07-13 ENCOUNTER — Other Ambulatory Visit: Payer: Self-pay

## 2020-07-13 ENCOUNTER — Ambulatory Visit: Payer: Medicare PPO | Admitting: Cardiovascular Disease

## 2020-07-13 VITALS — BP 120/62 | HR 66 | Ht 68.0 in | Wt 205.0 lb

## 2020-07-13 DIAGNOSIS — R0602 Shortness of breath: Secondary | ICD-10-CM | POA: Diagnosis not present

## 2020-07-13 DIAGNOSIS — I739 Peripheral vascular disease, unspecified: Secondary | ICD-10-CM

## 2020-07-13 DIAGNOSIS — I5032 Chronic diastolic (congestive) heart failure: Secondary | ICD-10-CM

## 2020-07-13 DIAGNOSIS — E782 Mixed hyperlipidemia: Secondary | ICD-10-CM

## 2020-07-13 DIAGNOSIS — I951 Orthostatic hypotension: Secondary | ICD-10-CM | POA: Diagnosis not present

## 2020-07-13 DIAGNOSIS — I25118 Atherosclerotic heart disease of native coronary artery with other forms of angina pectoris: Secondary | ICD-10-CM

## 2020-07-13 DIAGNOSIS — Z951 Presence of aortocoronary bypass graft: Secondary | ICD-10-CM

## 2020-07-13 NOTE — Patient Instructions (Signed)
Medication Instructions:  No changes  If you need a refill on your cardiac medications before your next appointment, please call your pharmacy.    Lab work: No new labs needed   If you have labs (blood work) drawn today and your tests are completely normal, you will receive your results only by: . MyChart Message (if you have MyChart) OR . A paper copy in the mail If you have any lab test that is abnormal or we need to change your treatment, we will call you to review the results.   Testing/Procedures: No new testing needed   Follow-Up: At [facility], you and your health needs are our priority.  As part of our continuing mission to provide you with exceptional heart care, we have created designated Provider Care Teams.  These Care Teams include your primary Cardiologist (physician) and Advanced Practice Providers (APPs -  Physician Assistants and Nurse Practitioners) who all work together to provide you with the care you need, when you need it.  . You will need a follow up appointment in 12 months  . Providers on your designated Care Team:   . Christopher Berge, NP . Ryan Dunn, PA-C . Jacquelyn Visser, PA-C  Any Other Special Instructions Will Be Listed Below (If Applicable).  COVID-19 Vaccine Information can be found at: [email or web address] For questions related to vaccine distribution or appointments, please email [email or web address] or call [phone].     

## 2020-07-17 ENCOUNTER — Ambulatory Visit: Payer: PPO | Admitting: Family

## 2020-07-20 ENCOUNTER — Ambulatory Visit: Payer: PPO | Admitting: Diagnostic Neuroimaging

## 2020-07-25 DIAGNOSIS — E1042 Type 1 diabetes mellitus with diabetic polyneuropathy: Secondary | ICD-10-CM | POA: Diagnosis not present

## 2020-08-04 ENCOUNTER — Other Ambulatory Visit: Payer: Medicare PPO

## 2020-08-18 ENCOUNTER — Other Ambulatory Visit (INDEPENDENT_AMBULATORY_CARE_PROVIDER_SITE_OTHER): Payer: Medicare PPO

## 2020-08-18 ENCOUNTER — Other Ambulatory Visit: Payer: Self-pay

## 2020-08-18 DIAGNOSIS — D649 Anemia, unspecified: Secondary | ICD-10-CM

## 2020-08-19 LAB — CBC WITH DIFFERENTIAL/PLATELET
Basophils Absolute: 0.1 10*3/uL (ref 0.0–0.1)
Basophils Relative: 0.7 % (ref 0.0–3.0)
Eosinophils Absolute: 0.4 10*3/uL (ref 0.0–0.7)
Eosinophils Relative: 6 % — ABNORMAL HIGH (ref 0.0–5.0)
HCT: 37.8 % — ABNORMAL LOW (ref 39.0–52.0)
Hemoglobin: 13 g/dL (ref 13.0–17.0)
Lymphocytes Relative: 35.3 % (ref 12.0–46.0)
Lymphs Abs: 2.6 10*3/uL (ref 0.7–4.0)
MCHC: 34.4 g/dL (ref 30.0–36.0)
MCV: 96.3 fl (ref 78.0–100.0)
Monocytes Absolute: 0.4 10*3/uL (ref 0.1–1.0)
Monocytes Relative: 5.9 % (ref 3.0–12.0)
Neutro Abs: 3.8 10*3/uL (ref 1.4–7.7)
Neutrophils Relative %: 52.1 % (ref 43.0–77.0)
Platelets: 201 10*3/uL (ref 150.0–400.0)
RBC: 3.93 Mil/uL — ABNORMAL LOW (ref 4.22–5.81)
RDW: 13.9 % (ref 11.5–15.5)
WBC: 7.3 10*3/uL (ref 4.0–10.5)

## 2020-08-20 DIAGNOSIS — E1069 Type 1 diabetes mellitus with other specified complication: Secondary | ICD-10-CM | POA: Diagnosis not present

## 2020-08-20 DIAGNOSIS — E785 Hyperlipidemia, unspecified: Secondary | ICD-10-CM | POA: Diagnosis not present

## 2020-08-20 DIAGNOSIS — E1159 Type 2 diabetes mellitus with other circulatory complications: Secondary | ICD-10-CM | POA: Diagnosis not present

## 2020-08-20 DIAGNOSIS — I152 Hypertension secondary to endocrine disorders: Secondary | ICD-10-CM | POA: Diagnosis not present

## 2020-08-20 DIAGNOSIS — E1042 Type 1 diabetes mellitus with diabetic polyneuropathy: Secondary | ICD-10-CM | POA: Diagnosis not present

## 2020-08-21 DIAGNOSIS — E1042 Type 1 diabetes mellitus with diabetic polyneuropathy: Secondary | ICD-10-CM | POA: Diagnosis not present

## 2020-08-28 ENCOUNTER — Other Ambulatory Visit: Payer: Self-pay | Admitting: Family

## 2020-08-28 DIAGNOSIS — R7989 Other specified abnormal findings of blood chemistry: Secondary | ICD-10-CM

## 2020-09-01 DIAGNOSIS — E1042 Type 1 diabetes mellitus with diabetic polyneuropathy: Secondary | ICD-10-CM | POA: Diagnosis not present

## 2020-09-07 DIAGNOSIS — E1042 Type 1 diabetes mellitus with diabetic polyneuropathy: Secondary | ICD-10-CM | POA: Diagnosis not present

## 2020-09-17 ENCOUNTER — Other Ambulatory Visit: Payer: Self-pay | Admitting: Cardiovascular Disease

## 2020-09-21 ENCOUNTER — Encounter: Payer: Self-pay | Admitting: Oncology

## 2020-09-21 ENCOUNTER — Inpatient Hospital Stay: Payer: Medicare PPO | Attending: Oncology | Admitting: Oncology

## 2020-09-21 ENCOUNTER — Inpatient Hospital Stay: Payer: Medicare PPO

## 2020-09-21 ENCOUNTER — Telehealth: Payer: Self-pay | Admitting: Family

## 2020-09-21 ENCOUNTER — Other Ambulatory Visit: Payer: Self-pay

## 2020-09-21 VITALS — BP 156/60 | HR 62 | Temp 97.8°F | Resp 18 | Wt 203.2 lb

## 2020-09-21 DIAGNOSIS — D721 Eosinophilia, unspecified: Secondary | ICD-10-CM

## 2020-09-21 DIAGNOSIS — M25541 Pain in joints of right hand: Secondary | ICD-10-CM | POA: Diagnosis not present

## 2020-09-21 DIAGNOSIS — M25542 Pain in joints of left hand: Secondary | ICD-10-CM | POA: Insufficient documentation

## 2020-09-21 DIAGNOSIS — M2560 Stiffness of unspecified joint, not elsewhere classified: Secondary | ICD-10-CM | POA: Diagnosis not present

## 2020-09-21 DIAGNOSIS — R898 Other abnormal findings in specimens from other organs, systems and tissues: Secondary | ICD-10-CM

## 2020-09-21 DIAGNOSIS — R7989 Other specified abnormal findings of blood chemistry: Secondary | ICD-10-CM

## 2020-09-21 LAB — CBC WITH DIFFERENTIAL/PLATELET
Abs Immature Granulocytes: 0.02 10*3/uL (ref 0.00–0.07)
Basophils Absolute: 0 10*3/uL (ref 0.0–0.1)
Basophils Relative: 0 %
Eosinophils Absolute: 0.4 10*3/uL (ref 0.0–0.5)
Eosinophils Relative: 6 %
HCT: 37 % — ABNORMAL LOW (ref 39.0–52.0)
Hemoglobin: 12.4 g/dL — ABNORMAL LOW (ref 13.0–17.0)
Immature Granulocytes: 0 %
Lymphocytes Relative: 39 %
Lymphs Abs: 2.3 10*3/uL (ref 0.7–4.0)
MCH: 31 pg (ref 26.0–34.0)
MCHC: 33.5 g/dL (ref 30.0–36.0)
MCV: 92.5 fL (ref 80.0–100.0)
Monocytes Absolute: 0.4 10*3/uL (ref 0.1–1.0)
Monocytes Relative: 6 %
Neutro Abs: 2.9 10*3/uL (ref 1.7–7.7)
Neutrophils Relative %: 49 %
Platelets: 191 10*3/uL (ref 150–400)
RBC: 4 MIL/uL — ABNORMAL LOW (ref 4.22–5.81)
RDW: 12.6 % (ref 11.5–15.5)
WBC: 6 10*3/uL (ref 4.0–10.5)
nRBC: 0 % (ref 0.0–0.2)

## 2020-09-21 LAB — C-REACTIVE PROTEIN: CRP: 0.7 mg/dL (ref <1.0)

## 2020-09-21 LAB — COMPREHENSIVE METABOLIC PANEL
ALT: 15 U/L (ref 0–44)
AST: 23 U/L (ref 15–41)
Albumin: 3.6 g/dL (ref 3.5–5.0)
Alkaline Phosphatase: 75 U/L (ref 38–126)
Anion gap: 11 (ref 5–15)
BUN: 19 mg/dL (ref 8–23)
CO2: 24 mmol/L (ref 22–32)
Calcium: 8.4 mg/dL — ABNORMAL LOW (ref 8.9–10.3)
Chloride: 102 mmol/L (ref 98–111)
Creatinine, Ser: 1.16 mg/dL (ref 0.61–1.24)
GFR, Estimated: >60 mL/min (ref >60)
Glucose, Bld: 200 mg/dL — ABNORMAL HIGH (ref 70–99)
Potassium: 4 mmol/L (ref 3.5–5.1)
Sodium: 137 mmol/L (ref 135–145)
Total Bilirubin: 0.6 mg/dL (ref 0.3–1.2)
Total Protein: 6.5 g/dL (ref 6.5–8.1)

## 2020-09-21 LAB — VITAMIN B12: Vitamin B-12: 377 pg/mL (ref 180–914)

## 2020-09-21 LAB — TROPONIN I (HIGH SENSITIVITY): Troponin I (High Sensitivity): 12 ng/L (ref <18)

## 2020-09-21 LAB — PATHOLOGIST SMEAR REVIEW

## 2020-09-21 LAB — SEDIMENTATION RATE: Sed Rate: 22 mm/hr — ABNORMAL HIGH (ref 0–20)

## 2020-09-21 NOTE — Telephone Encounter (Signed)
Patient's wife called in wanted a call back about the referral that patient has today she wanted a call before 2:30 [phone]

## 2020-09-21 NOTE — Telephone Encounter (Signed)
I spoke with patient's wife & explained that hematology was in the cancer center & they also were oncologist. I told her patient was not going bc he had cancer, but that his eosinophils were elevated for a while now & Joycelyn Schmid felt further work up warranted. Pt's wife felt bettter & did not want to be surprised of something serious was wrong.

## 2020-09-21 NOTE — Progress Notes (Signed)
Hematology/Oncology Consult note [facility] Telephone:([phone] Fax:([phone]   Patient Care Team: Burnard Hawthorne, FNP as PCP - General (Family Medicine) Rockey Situ Kathlene November, MD as Consulting Physician (Cardiology)  REFERRING PROVIDER: Burnard Hawthorne, FNP  CHIEF COMPLAINTS/REASON FOR VISIT:  Evaluation of abnormal CBC  HISTORY OF PRESENTING ILLNESS:   Todd Clay is a  75 y.o.  male with PMH listed below was seen in consultation at the request of  Burnard Hawthorne, FNP  for evaluation of abnormal CBC  Patient recently had blood work done on 08/18/2020, CBC showed increased eosinophil percentage at 6%-[normal reference 0-5%]. He has no complaints today.  With further questioning, he endorses some chronic hand joint pain, morning stiffness.  No unintentional weight loss, fever, night sweats. Also reports chronic sinus congestion, he does not think the symptoms are worse during spring. He was started on losartan 25 mg in November 2021, restarted on Lasix in December 2021.  He reports that helps his breathing symptoms.   Review of Systems  Constitutional: Positive for fatigue. Negative for appetite change, chills, fever and unexpected weight change.  HENT:   Negative for hearing loss and voice change.   Eyes: Negative for eye problems and icterus.  Respiratory: Negative for chest tightness, cough and shortness of breath.   Cardiovascular: Negative for chest pain and leg swelling.  Gastrointestinal: Negative for abdominal distention and abdominal pain.  Endocrine: Negative for hot flashes.  Genitourinary: Negative for difficulty urinating, dysuria and frequency.   Musculoskeletal: Positive for arthralgias.       Bilateral hand joint pain and morning stiffness  Skin: Negative for itching and rash.  Neurological: Negative for light-headedness and numbness.  Hematological: Negative for adenopathy. Does not bruise/bleed easily.   Psychiatric/Behavioral: Negative for confusion.    MEDICAL HISTORY:  Past Medical History:  Diagnosis Date  . CAD (coronary artery disease)   . Depression   . Diabetes mellitus with complication ([place])   . Diabetic neuropathy ([place])   . History of chickenpox   . HLD (hyperlipidemia)   . HTN (hypertension)   . MI (myocardial infarction) ([place])    x3  . Osteomyelitis ([place])    Left 2nd and 3rd metatarsal  . Proliferative diabetic retinopathy ([place])    OU  . PVD (peripheral vascular disease) ([place])   . Retinal detachment    OD    SURGICAL HISTORY: Past Surgical History:  Procedure Laterality Date  . 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 CARDIAC CATHETERIZATION    . CORONARY ANGIOPLASTY     Patient has a total of 5 stents placed.  . CORONARY ARTERY BYPASS GRAFT  1998   x1 vessel @ [facility]  . RETINAL DETACHMENT SURGERY     Laser  . STENTS     x 5   . VITREOUS RETINAL SURGERY  1985   Laser tx [facility]    SOCIAL HISTORY: Social History   Socioeconomic History  . Marital status: Married    Spouse name: Heritage manager  . Number of children: 2  . Years of education: 51  . Highest education level: Not on file  Occupational History  . Occupation: Retired  Tobacco Use  . Smoking status: Never Smoker  . Smokeless tobacco: Never Used  Vaping Use  . Vaping Use: Never used  Substance and Sexual Activity  . Alcohol use: No  . Drug use: No  . Sexual activity: Not on file  Other  Topics Concern  . Not on file  Social History Narrative   Lives at home with wife.   Caffeine use: daily   Social Determinants of Health   Financial Resource Strain: Not on file  Food Insecurity: Not on file  Transportation Needs: Not on file  Physical Activity: Not on file  Stress: Not on file  Social Connections: Not on file  Intimate Partner Violence: Not on file    FAMILY HISTORY: Family History  Problem Relation Age of Onset  . Kidney disease Mother        Kidney  failure  . Heart disease Mother   . Heart disease Father        CAD  . Cancer Brother        Head & Neck Tumor  . Diabetes Maternal Aunt   . Diabetes Maternal Uncle   . Diabetes Maternal Grandmother   . Cancer Maternal Grandmother   . Diabetes Maternal Grandfather     ALLERGIES:  has No Known Allergies.  MEDICATIONS:  Current Outpatient Medications  Medication Sig Dispense Refill  . aspirin EC 81 MG tablet Take 81 mg by mouth daily. Swallow whole.    . clopidogrel (PLAVIX) 75 MG tablet Take 1 tablet by mouth once daily 90 tablet 2  . Continuous Blood Gluc Receiver (FREESTYLE LIBRE 14 DAY READER) DEVI USE TO TEST BLOOD SUGAR    . Continuous Blood Gluc Sensor (FREESTYLE LIBRE 14 DAY SENSOR) MISC USE 1 SENSOR EVERY 14 DAYS    . furosemide (LASIX) 20 MG tablet Take 1 tablet (20 mg total) by mouth daily as needed. 90 tablet 1  . gabapentin (NEURONTIN) 300 MG capsule TAKE THREE CAPSULES BY MOUTH THREE TIMES DAILY 270 capsule 1  . insulin regular (NOVOLIN R RELION) 100 units/mL injection Inject 0.06 mLs (6 Units total) into the skin 3 (three) times daily before meals. 10 mL 2  . losartan (COZAAR) 25 MG tablet Take 1 tablet (25 mg total) by mouth daily. 90 tablet 1  . sertraline (ZOLOFT) 50 MG tablet Take 1 tablet (50 mg total) by mouth at bedtime. 90 tablet 3  . simvastatin (ZOCOR) 80 MG tablet TAKE ONE TABLET BY MOUTH IN THE EVENING AT 6 PM 90 tablet 2  . TRESIBA FLEXTOUCH 100 UNIT/ML SOPN FlexTouch Pen Inject 14 Units into the skin daily.   3   No current facility-administered medications for this visit.     PHYSICAL EXAMINATION: ECOG PERFORMANCE STATUS: 1 - Symptomatic but completely ambulatory Vitals:   09/21/20 1447  BP: (!) 156/60  Pulse: 62  Resp: 18  Temp: 97.8 F (36.6 C)   Filed Weights   09/21/20 1447  Weight: 203 lb 3.2 oz (92.2 kg)    Physical Exam Constitutional:      General: He is not in acute distress. HENT:     Head: Normocephalic and atraumatic.   Eyes:     General: No scleral icterus. Cardiovascular:     Rate and Rhythm: Normal rate and regular rhythm.     Heart sounds: Normal heart sounds.  Pulmonary:     Effort: Pulmonary effort is normal. No respiratory distress.     Breath sounds: No wheezing.  Abdominal:     General: Bowel sounds are normal. There is no distension.     Palpations: Abdomen is soft.  Musculoskeletal:        General: Normal range of motion.     Cervical back: Normal range of motion and neck supple.  Comments: Bilateral interphalangeal joint deformity  Skin:    General: Skin is warm and dry.     Findings: No erythema or rash.  Neurological:     Mental Status: He is alert and oriented to person, place, and time. Mental status is at baseline.     Cranial Nerves: No cranial nerve deficit.     Coordination: Coordination normal.  Psychiatric:        Mood and Affect: Mood normal.     LABORATORY DATA:  I have reviewed the data as listed Lab Results  Component Value Date   WBC 6.0 09/21/2020   HGB 12.4 (L) 09/21/2020   HCT 37.0 (L) 09/21/2020   MCV 92.5 09/21/2020   PLT 191 09/21/2020   Recent Labs    03/04/20 1156 04/30/20 0818 05/18/20 1449 09/21/20 1528  NA 137 141 138 137  K 4.5 4.2 4.7 4.0  CL 103 102 102 102  CO2 _0 GLUCOSE 198* 135* 195* 200*  BUN 21 18 26* 19  CREATININE 1.12 1.21 1.25 1.16  CALCIUM 8.4 8.8 8.9 8.4*  GFRNONAA  --   --   --  >60  PROT 6.2  --   --  6.5  ALBUMIN 3.6  --   --  3.6  AST 21  --   --  23  ALT 14  --   --  15  ALKPHOS 76  --   --  75  BILITOT 0.5  --   --  0.6   Iron/TIBC/Ferritin/ %Sat    Component Value Date/Time   IRON 72 04/30/2020 0818   FERRITIN 90.9 04/30/2020 0818   IRONPCTSAT 27.2 04/30/2020 0818      RADIOGRAPHIC STUDIES: I have personally reviewed the radiological images as listed and agreed with the findings in the report. No results found.    ASSESSMENT & PLAN:  1. Eosinophilia, unspecified type   2. Morning  joint stiffness   3. Arthralgia of both hands    #Eosinophilia, Very mild increased eosinophilia percentage.  Normal absolute eosinophil count.  Discussed with patient that the increased eosinophil percentage is most likely reactive.  Possible underlying chronic inflammation, drug reaction. Absolute eosinophil is normal I will hold off myeloproliferative disease work-up at this point. Check CBC, pathology smear, CMP, tryptase, troponin.  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  Check ANA, CRP, ESR  Orders Placed This Encounter  Procedures  . CBC with Differential/Platelet    Standing Status:   Future    Number of Occurrences:   1    Standing Expiration Date:   09/21/2021  . Pathologist smear review    Standing Status:   Future    Number of Occurrences:   1    Standing Expiration Date:   09/21/2021  . Comprehensive metabolic panel    Standing Status:   Future    Number of Occurrences:   1    Standing Expiration Date:   09/21/2021  . ANA w/Reflex    Standing Status:   Future    Number of Occurrences:   1    Standing Expiration Date:   09/21/2021  . C-reactive protein    Standing Status:   Future    Number of Occurrences:   1    Standing Expiration Date:   09/21/2021  . Sedimentation rate    Standing Status:   Future    Number of Occurrences:   1  Standing Expiration Date:   09/21/2021  . Vitamin B12    Standing Status:   Future    Number of Occurrences:   1    Standing Expiration Date:   09/21/2021  . Tryptase    Standing Status:   Future    Number of Occurrences:   1    Standing Expiration Date:   09/21/2021    All questions were answered. The patient knows to call the clinic with any problems questions or concerns.  cc Burnard Hawthorne, FNP    Return of visit: Follow-up virtually in 2 weeks Thank you for this kind referral and the opportunity to participate in the care of this patient. A copy  of today's note is routed to referring provider    Earlie Server, MD, PhD Hematology Oncology [facility] Pager- [phone] 09/21/2020

## 2020-09-21 NOTE — Progress Notes (Signed)
Pt here to establish care.

## 2020-09-22 LAB — ANA W/REFLEX: Anti Nuclear Antibody (ANA): NEGATIVE

## 2020-09-27 LAB — TRYPTASE: Tryptase: 13.7 ug/L — ABNORMAL HIGH (ref 2.2–13.2)

## 2020-10-06 ENCOUNTER — Telehealth: Payer: Self-pay | Admitting: Oncology

## 2020-10-06 ENCOUNTER — Encounter: Payer: Self-pay | Admitting: Oncology

## 2020-10-06 ENCOUNTER — Inpatient Hospital Stay: Payer: Medicare PPO | Admitting: Oncology

## 2020-10-06 NOTE — Telephone Encounter (Signed)
Left VM with patient to notify him of rescheduled appt from 5/10 (Virtual) to in person on 5/16 due to not having the capability to have a video visit on current phone.

## 2020-10-10 NOTE — Progress Notes (Signed)
cancelled

## 2020-10-12 ENCOUNTER — Other Ambulatory Visit: Payer: Self-pay

## 2020-10-12 ENCOUNTER — Inpatient Hospital Stay: Payer: Medicare PPO | Attending: Oncology | Admitting: Oncology

## 2020-10-12 ENCOUNTER — Encounter: Payer: Self-pay | Admitting: Oncology

## 2020-10-12 VITALS — BP 191/70 | HR 63 | Temp 97.4°F | Resp 18 | Wt 202.5 lb

## 2020-10-12 DIAGNOSIS — M25541 Pain in joints of right hand: Secondary | ICD-10-CM

## 2020-10-12 DIAGNOSIS — Z7982 Long term (current) use of aspirin: Secondary | ICD-10-CM | POA: Insufficient documentation

## 2020-10-12 DIAGNOSIS — Z79899 Other long term (current) drug therapy: Secondary | ICD-10-CM | POA: Insufficient documentation

## 2020-10-12 DIAGNOSIS — M25542 Pain in joints of left hand: Secondary | ICD-10-CM

## 2020-10-12 DIAGNOSIS — D721 Eosinophilia, unspecified: Secondary | ICD-10-CM | POA: Diagnosis not present

## 2020-10-12 NOTE — Progress Notes (Signed)
Pt here for follow up. No new concerns voiced.   

## 2020-10-12 NOTE — Progress Notes (Addendum)
Hematology/Oncology Consult note [facility] [phone] [phone]   Patient Care Team: Burnard Hawthorne, FNP as PCP - General (Family Medicine) Rockey Situ Kathlene November, MD as Consulting Physician (Cardiology)  REFERRING PROVIDER: Burnard Hawthorne, FNP  CHIEF COMPLAINTS/REASON FOR VISIT:  Evaluation of abnormal CBC  HISTORY OF PRESENTING ILLNESS:   Todd Clay is a  75 y.o.  male with PMH listed below was seen in consultation at the request of  Burnard Hawthorne, FNP  for evaluation of abnormal CBC  Patient recently had blood work done on 08/18/2020, CBC showed increased eosinophil percentage at 6%-[normal reference 0-5%]. He has no complaints today.  With further questioning, he endorses some chronic hand joint pain, morning stiffness.  No unintentional weight loss, fever, night sweats. Also reports chronic sinus congestion, he does not think the symptoms are worse during spring. He was started on losartan 25 mg in November 2021, restarted on Lasix in December 2021.  He reports that helps his breathing symptoms.   INTERVAL HISTORY MANUEL DALL is a 75 y.o. male who has above history reviewed by me today presents for follow up visit for eosinophilia. Problems and complaints are listed below: Patient had work-up done during interval and presents to discuss results.  He denies any unintentional weight loss, fever, night sweats.  Denies any skin rash, history of anaphylactic reaction. Has noticed left shoulder pain for the past few weeks.  Motions are limited due to the pain.  Review of Systems  Constitutional: Positive for fatigue. Negative for appetite change, chills, fever and unexpected weight change.  HENT:   Negative for hearing loss and voice change.   Eyes: Negative for eye problems and icterus.  Respiratory: Negative for chest tightness, cough and shortness of breath.   Cardiovascular: Negative for chest pain and leg  swelling.  Gastrointestinal: Negative for abdominal distention and abdominal pain.  Endocrine: Negative for hot flashes.  Genitourinary: Negative for difficulty urinating, dysuria and frequency.   Musculoskeletal: Positive for arthralgias.       Bilateral hand joint pain and morning stiffness  Skin: Negative for itching and rash.  Neurological: Negative for light-headedness and numbness.  Hematological: Negative for adenopathy. Does not bruise/bleed easily.  Psychiatric/Behavioral: Negative for confusion.    MEDICAL HISTORY:  Past Medical History:  Diagnosis Date  . CAD (coronary artery disease)   . Depression   . Diabetes mellitus with complication ([place])   . Diabetic neuropathy ([place])   . History of chickenpox   . HLD (hyperlipidemia)   . HTN (hypertension)   . 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 Osteomyelitis ([place])    Left 2nd and 3rd metatarsal  . 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 PVD (peripheral vascular disease) ([place])   . Retinal detachment    OD    SURGICAL HISTORY: Past Surgical History:  Procedure Laterality Date  . 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 CARDIAC CATHETERIZATION    . CORONARY ANGIOPLASTY     Patient has a total of 5 stents placed.  . CORONARY ARTERY BYPASS GRAFT  1998   x1 vessel @ [facility]  . RETINAL DETACHMENT SURGERY     Laser  . STENTS     x 5   . VITREOUS RETINAL SURGERY  1985   Laser tx [facility]    SOCIAL HISTORY: Social History   Socioeconomic History  . Marital status: Married  Spouse name: Cathera  . Number of children: 2  . Years of education: 70  . Highest education level: Not on file  Occupational History  . Occupation: Retired  Tobacco Use  . Smoking status: Never Smoker  . Smokeless tobacco: Never Used  Vaping Use  . Vaping Use: Never used  Substance and Sexual Activity  . Alcohol use: No  . Drug use: No  . Sexual activity: Not on file  Other Topics Concern  . Not on  file  Social History Narrative   Lives at home with wife.   Caffeine use: daily   Social Determinants of Health   Financial Resource Strain: Not on file  Food Insecurity: Not on file  Transportation Needs: Not on file  Physical Activity: Not on file  Stress: Not on file  Social Connections: Not on file  Intimate Partner Violence: Not on file    FAMILY HISTORY: Family History  Problem Relation Age of Onset  . Kidney disease Mother        Kidney failure  . Heart disease Mother   . Heart disease Father        CAD  . Cancer Brother        Head & Neck Tumor  . Diabetes Maternal Aunt   . Diabetes Maternal Uncle   . Diabetes Maternal Grandmother   . Cancer Maternal Grandmother   . Diabetes Maternal Grandfather     ALLERGIES:  has No Known Allergies.  MEDICATIONS:  Current Outpatient Medications  Medication Sig Dispense Refill  . aspirin EC 81 MG tablet Take 81 mg by mouth daily. Swallow whole.    . clopidogrel (PLAVIX) 75 MG tablet Take 1 tablet by mouth once daily 90 tablet 2  . Continuous Blood Gluc Receiver (FREESTYLE LIBRE 14 DAY READER) DEVI USE TO TEST BLOOD SUGAR    . Continuous Blood Gluc Sensor (FREESTYLE LIBRE 14 DAY SENSOR) MISC USE 1 SENSOR EVERY 14 DAYS    . furosemide (LASIX) 20 MG tablet Take 1 tablet (20 mg total) by mouth daily as needed. 90 tablet 1  . gabapentin (NEURONTIN) 300 MG capsule TAKE THREE CAPSULES BY MOUTH THREE TIMES DAILY 270 capsule 1  . insulin regular (NOVOLIN R RELION) 100 units/mL injection Inject 0.06 mLs (6 Units total) into the skin 3 (three) times daily before meals. 10 mL 2  . losartan (COZAAR) 25 MG tablet Take 1 tablet (25 mg total) by mouth daily. 90 tablet 1  . sertraline (ZOLOFT) 50 MG tablet Take 1 tablet (50 mg total) by mouth at bedtime. 90 tablet 3  . simvastatin (ZOCOR) 80 MG tablet TAKE ONE TABLET BY MOUTH IN THE EVENING AT 6 PM 90 tablet 2  . TRESIBA FLEXTOUCH 100 UNIT/ML SOPN FlexTouch Pen Inject 14 Units into the skin  daily.   3   No current facility-administered medications for this visit.     PHYSICAL EXAMINATION: ECOG PERFORMANCE STATUS: 1 - Symptomatic but completely ambulatory Vitals:   10/12/20 1340  BP: (!) 191/70  Pulse: 63  Resp: 18  Temp: (!) 97.4 F (36.3 C)   Filed Weights   10/12/20 1340  Weight: 202 lb 8 oz (91.9 kg)    Physical Exam Constitutional:      General: He is not in acute distress. HENT:     Head: Normocephalic and atraumatic.  Eyes:     General: No scleral icterus. Cardiovascular:     Rate and Rhythm: Normal rate and regular rhythm.     Heart  sounds: Normal heart sounds.  Pulmonary:     Effort: Pulmonary effort is normal. No respiratory distress.     Breath sounds: No wheezing.  Abdominal:     General: Bowel sounds are normal. There is no distension.     Palpations: Abdomen is soft.  Musculoskeletal:        General: Normal range of motion.     Cervical back: Normal range of motion and neck supple.     Comments: Bilateral interphalangeal joint deformity  Skin:    General: Skin is warm and dry.     Findings: No erythema or rash.  Neurological:     Mental Status: He is alert and oriented to person, place, and time. Mental status is at baseline.     Cranial Nerves: No cranial nerve deficit.     Coordination: Coordination normal.  Psychiatric:        Mood and Affect: Mood normal.     LABORATORY DATA:  I have reviewed the data as listed Lab Results  Component Value Date   WBC 6.0 09/21/2020   HGB 12.4 (L) 09/21/2020   HCT 37.0 (L) 09/21/2020   MCV 92.5 09/21/2020   PLT 191 09/21/2020   Recent Labs    03/04/20 1156 04/30/20 0818 05/18/20 1449 09/21/20 1528  NA 137 141 138 137  K 4.5 4.2 4.7 4.0  CL 103 102 102 102  CO2 _0 GLUCOSE 198* 135* 195* 200*  BUN 21 18 26* 19  CREATININE 1.12 1.21 1.25 1.16  CALCIUM 8.4 8.8 8.9 8.4*  GFRNONAA  --   --   --  >60  PROT 6.2  --   --  6.5  ALBUMIN 3.6  --   --  3.6  AST 21  --   --  23   ALT 14  --   --  15  ALKPHOS 76  --   --  75  BILITOT 0.5  --   --  0.6   Iron/TIBC/Ferritin/ %Sat    Component Value Date/Time   IRON 72 04/30/2020 0818   FERRITIN 90.9 04/30/2020 0818   IRONPCTSAT 27.2 04/30/2020 0818      RADIOGRAPHIC STUDIES: I have personally reviewed the radiological images as listed and agreed with the findings in the report. No results found.    ASSESSMENT & PLAN:  1. Eosinophilia, unspecified type   2. Arthralgia of both hands    #Eosinophilia, Borderline eosinophilia percentage with normal absolute eosinophil count.   Labs reviewed and discussed with patient. patient has mildly elevated tryptase, elevated ESR, most likely secondary to chronic inflammation. He does not have any symptoms for mastocytosis Recommend continue to monitor symptoms.  Arthralgia-  left shoulder pain and bilateral hand arthralgia.  Will touch base with his PCP for further evaluation.    All questions were answered. The patient knows to call the clinic with any problems questions or concerns.  cc Burnard Hawthorne, FNP    Return of visit: 6 months Thank you for this kind referral and the opportunity to participate in the care of this patient. A copy of today's note is routed to referring provider    Earlie Server, MD, PhD Hematology Oncology [facility] at [facility] Pager- [phone] 10/12/2020

## 2020-10-16 DIAGNOSIS — R898 Other abnormal findings in specimens from other organs, systems and tissues: Secondary | ICD-10-CM | POA: Insufficient documentation

## 2020-10-16 DIAGNOSIS — D721 Eosinophilia, unspecified: Secondary | ICD-10-CM | POA: Insufficient documentation

## 2020-10-16 NOTE — Telephone Encounter (Signed)
I called and spoke with patient & his wife. Patient just wanted to wait until he was going to be seen in July. He was going to the beach all next week. If he changes his mind he will call back.

## 2020-10-16 NOTE — Telephone Encounter (Signed)
-----   Message from Earlie Server, MD sent at 10/13/2020 11:27 PM EDT ----- Hi there, wanted to touch base with you about this patient's eosinophilia. Eosinophilia is very mild, percentages is borderline high, absolute eosinophil numbers are not significantly elevated.  I think this is most likely secondary to chronic inflammation.  He does have left shoulder pain, bilateral hand arthralgia.  I was planning to get an x-ray of left shoulder but I forget to order and he left.  Would you please follow-up and take care of that.  Thank you Appreciate your help.   zhou

## 2020-10-16 NOTE — Telephone Encounter (Signed)
Call pt Dr Tasia Catchings kindly informed me that he was complaining of left shoulder pain and she had wanted to get xray   Can I order left xray for pt? Let me know   Does he want to come in for visit as well to discuss?  He has appt in July but offer to move up if he would like

## 2020-11-03 ENCOUNTER — Ambulatory Visit (INDEPENDENT_AMBULATORY_CARE_PROVIDER_SITE_OTHER): Payer: Medicare PPO

## 2020-11-03 VITALS — Ht 68.0 in | Wt 202.0 lb

## 2020-11-03 DIAGNOSIS — Z0000 Encounter for general adult medical examination without abnormal findings: Secondary | ICD-10-CM | POA: Diagnosis not present

## 2020-11-03 NOTE — Patient Instructions (Addendum)
Todd Clay , Thank you for taking time to come for your Medicare Wellness Visit. I appreciate your ongoing commitment to your health goals. Please review the following plan we discussed and let me know if I can assist you in the future.   These are the goals we discussed: Goals    . Maintain Healthy Lifestyle     Stay active Healthy low carb diet        This is a list of the screening recommended for you and due dates:  Health Maintenance  Topic Date Due  . Hemoglobin A1C  10/13/2020  . COVID-19 Vaccine (3 - Booster for Pfizer series) 11/19/2020*  . Zoster (Shingles) Vaccine (1 of 2) 02/03/2021*  . Cologuard (Stool DNA test)  08/20/2021*  . Pneumococcal Vaccination (1 of 2 - PPSV23) 11/03/2021*  . Tetanus Vaccine  11/03/2021*  . Hepatitis C Screening: USPSTF Recommendation to screen - Ages 18-79 yo.  11/03/2021*  . Flu Shot  12/28/2020  . Eye exam for diabetics  03/30/2021  . Complete foot exam   08/20/2021  . Pneumonia vaccines  Completed  . HPV Vaccine  Aged Out  *Topic was postponed. The date shown is not the original due date.   Immunizations Immunization History  Administered Date(s) Administered  . Fluad Quad(high Dose 75+) 03/04/2020  . Influenza Inj Mdck Quad Pf 03/06/2017, 04/17/2018  . Influenza,inj,Quad PF,6+ Mos 02/14/2014  . Influenza-Unspecified 03/06/2017  . PFIZER(Purple Top)SARS-COV-2 Vaccination 07/26/2019, 08/21/2019  . Pneumococcal Conjugate-13 04/30/2020  . Pneumococcal Polysaccharide-23 04/06/2011  . Zoster, Live 04/05/1996   Advanced directives: not yet completed  Follow up in one year for your annual wellness visit.   Preventive Care 38 Years and Older, Male Preventive care refers to lifestyle choices and visits with your health care provider that can promote health and wellness. What does preventive care include?  A yearly physical exam. This is also called an annual well check.  Dental exams once or twice a year.  Routine eye exams.  Ask your health care provider how often you should have your eyes checked.  Personal lifestyle choices, including:  Daily care of your teeth and gums.  Regular physical activity.  Eating a healthy diet.  Avoiding tobacco and drug use.  Limiting alcohol use.  Practicing safe sex.  Taking low doses of aspirin every day.  Taking vitamin and mineral supplements as recommended by your health care provider. What happens during an annual well check? The services and screenings done by your health care provider during your annual well check will depend on your age, overall health, lifestyle risk factors, and family history of disease. Counseling  Your health care provider may ask you questions about your:  Alcohol use.  Tobacco use.  Drug use.  Emotional well-being.  Home and relationship well-being.  Sexual activity.  Eating habits.  History of falls.  Memory and ability to understand (cognition).  Work and work Statistician. Screening  You may have the following tests or measurements:  Height, weight, and BMI.  Blood pressure.  Lipid and cholesterol levels. These may be checked every 5 years, or more frequently if you are over 31 years old.  Skin check.  Lung cancer screening. You may have this screening every year starting at age 75 if you have a 30-pack-year history of smoking and currently smoke or have quit within the past 15 years.  Fecal occult blood test (FOBT) of the stool. You may have this test every year starting at age 75.  Flexible  sigmoidoscopy or colonoscopy. You may have a sigmoidoscopy every 5 years or a colonoscopy every 10 years starting at age 75.  Prostate cancer screening. Recommendations will vary depending on your family history and other risks.  Hepatitis C blood test.  Hepatitis B blood test.  Sexually transmitted disease (STD) testing.  Diabetes screening. This is done by checking your blood sugar (glucose) after you have not  eaten for a while (fasting). You may have this done every 1-3 years.  Abdominal aortic aneurysm (AAA) screening. You may need this if you are a current or former smoker.  Osteoporosis. You may be screened starting at age 13 if you are at high risk. Talk with your health care provider about your test results, treatment options, and if necessary, the need for more tests. Vaccines  Your health care provider may recommend certain vaccines, such as:  Influenza vaccine. This is recommended every year.  Tetanus, diphtheria, and acellular pertussis (Tdap, Td) vaccine. You may need a Td booster every 10 years.  Zoster vaccine. You may need this after age 55.  Pneumococcal 13-valent conjugate (PCV13) vaccine. One dose is recommended after age 75.  Pneumococcal polysaccharide (PPSV23) vaccine. One dose is recommended after age 75. Talk to your health care provider about which screenings and vaccines you need and how often you need them. This information is not intended to replace advice given to you by your health care provider. Make sure you discuss any questions you have with your health care provider. Document Released: 06/12/2015 Document Revised: 02/03/2016 Document Reviewed: 03/17/2015 Elsevier Interactive Patient Education  2017 [place] Prevention in the Home Falls can cause injuries. They can happen to people of all ages. There are many things you can do to make your home safe and to help prevent falls. What can I do on the outside of my home?  Regularly fix the edges of walkways and driveways and fix any cracks.  Remove anything that might make you trip as you walk through a door, such as a raised step or threshold.  Trim any bushes or trees on the path to your home.  Use bright outdoor lighting.  Clear any walking paths of anything that might make someone trip, such as rocks or tools.  Regularly check to see if handrails are loose or broken. Make sure that both sides  of any steps have handrails.  Any raised decks and porches should have guardrails on the edges.  Have any leaves, snow, or ice cleared regularly.  Use sand or salt on walking paths during winter.  Clean up any spills in your garage right away. This includes oil or grease spills. What can I do in the bathroom?  Use night lights.  Install grab bars by the toilet and in the tub and shower. Do not use towel bars as grab bars.  Use non-skid mats or decals in the tub or shower.  If you need to sit down in the shower, use a plastic, non-slip stool.  Keep the floor dry. Clean up any water that spills on the floor as soon as it happens.  Remove soap buildup in the tub or shower regularly.  Attach bath mats securely with double-sided non-slip rug tape.  Do not have throw rugs and other things on the floor that can make you trip. What can I do in the bedroom?  Use night lights.  Make sure that you have a light by your bed that is easy to reach.  Do not  use any sheets or blankets that are too big for your bed. They should not hang down onto the floor.  Have a firm chair that has side arms. You can use this for support while you get dressed.  Do not have throw rugs and other things on the floor that can make you trip. What can I do in the kitchen?  Clean up any spills right away.  Avoid walking on wet floors.  Keep items that you use a lot in easy-to-reach places.  If you need to reach something above you, use a strong step stool that has a grab bar.  Keep electrical cords out of the way.  Do not use floor polish or wax that makes floors slippery. If you must use wax, use non-skid floor wax.  Do not have throw rugs and other things on the floor that can make you trip. What can I do with my stairs?  Do not leave any items on the stairs.  Make sure that there are handrails on both sides of the stairs and use them. Fix handrails that are broken or loose. Make sure that  handrails are as long as the stairways.  Check any carpeting to make sure that it is firmly attached to the stairs. Fix any carpet that is loose or worn.  Avoid having throw rugs at the top or bottom of the stairs. If you do have throw rugs, attach them to the floor with carpet tape.  Make sure that you have a light switch at the top of the stairs and the bottom of the stairs. If you do not have them, ask someone to add them for you. What else can I do to help prevent falls?  Wear shoes that:  Do not have high heels.  Have rubber bottoms.  Are comfortable and fit you well.  Are closed at the toe. Do not wear sandals.  If you use a stepladder:  Make sure that it is fully opened. Do not climb a closed stepladder.  Make sure that both sides of the stepladder are locked into place.  Ask someone to hold it for you, if possible.  Clearly mark and make sure that you can see:  Any grab bars or handrails.  First and last steps.  Where the edge of each step is.  Use tools that help you move around (mobility aids) if they are needed. These include:  Canes.  Walkers.  Scooters.  Crutches.  Turn on the lights when you go into a dark area. Replace any light bulbs as soon as they burn out.  Set up your furniture so you have a clear path. Avoid moving your furniture around.  If any of your floors are uneven, fix them.  If there are any pets around you, be aware of where they are.  Review your medicines with your doctor. Some medicines can make you feel dizzy. This can increase your chance of falling. Ask your doctor what other things that you can do to help prevent falls. This information is not intended to replace advice given to you by your health care provider. Make sure you discuss any questions you have with your health care provider. Document Released: 03/12/2009 Document Revised: 10/22/2015 Document Reviewed: 06/20/2014 Elsevier Interactive Patient Education  2017  [employer].

## 2020-11-03 NOTE — Progress Notes (Signed)
Subjective:   Todd Clay is a 75 y.o. male who presents for an Initial Medicare Annual Wellness Visit.  Review of Systems    No ROS.  Medicare Wellness Virtual Visit.  Visual/audio telehealth visit, UTA vital signs.   See social history for additional risk factors.  Cardiac Risk Factors include: advanced age (>41mn, >>57women);male gender     Objective:    Today's Vitals   11/03/20 1405  Weight: 202 lb (91.6 kg)  Height: 5' 8"  (1.727 m)   Body mass index is 30.71 kg/m.  Advanced Directives 11/03/2020 10/12/2020 10/06/2020 09/21/2020  Does Patient Have a Medical Advance Directive? No No No No  Would patient like information on creating a medical advance directive? No - Patient declined - - -    Current Medications (verified) Outpatient Encounter Medications as of 11/03/2020  Medication Sig  . aspirin EC 81 MG tablet Take 81 mg by mouth daily. Swallow whole.  . clopidogrel (PLAVIX) 75 MG tablet Take 1 tablet by mouth once daily  . Continuous Blood Gluc Receiver (FREESTYLE LIBRE 14 DAY READER) DEVI USE TO TEST BLOOD SUGAR  . Continuous Blood Gluc Sensor (FREESTYLE LIBRE 14 DAY SENSOR) MISC USE 1 SENSOR EVERY 14 DAYS  . furosemide (LASIX) 20 MG tablet Take 1 tablet (20 mg total) by mouth daily as needed.  . gabapentin (NEURONTIN) 300 MG capsule TAKE THREE CAPSULES BY MOUTH THREE TIMES DAILY  . insulin regular (NOVOLIN R RELION) 100 units/mL injection Inject 0.06 mLs (6 Units total) into the skin 3 (three) times daily before meals.  .Marland Kitchenlosartan (COZAAR) 25 MG tablet Take 1 tablet (25 mg total) by mouth daily.  . sertraline (ZOLOFT) 50 MG tablet Take 1 tablet (50 mg total) by mouth at bedtime.  . simvastatin (ZOCOR) 80 MG tablet TAKE ONE TABLET BY MOUTH IN THE EVENING AT 6 PM  . TRESIBA FLEXTOUCH 100 UNIT/ML SOPN FlexTouch Pen Inject 14 Units into the skin daily.    No facility-administered encounter medications on file as of 11/03/2020.    Allergies (verified) Patient has no  known allergies.   History: Past Medical History:  Diagnosis Date  . CAD (coronary artery disease)   . Depression   . Diabetes mellitus with complication ([facility]   . Diabetic neuropathy ([place]   . History of chickenpox   . HLD (hyperlipidemia)   . HTN (hypertension)   . MI (myocardial infarction) ([place]    x3  . Osteomyelitis ([facility]    Left 2nd and 3rd metatarsal  . Proliferative diabetic retinopathy ([place]    OU  . PVD [place]peripheral vascular disease) ([place]   . Retinal detachment    OD   Past Surgical History:  Procedure Laterality Date  . 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 CARDIAC CATHETERIZATION    . CORONARY ANGIOPLASTY     Patient has a total of 5 stents placed.  . CORONARY ARTERY BYPASS GRAFT  1998   x1 vessel @ [facility]  . RETINAL DETACHMENT SURGERY     Laser  . STENTS     x 5   . VITREOUS RETINAL SURGERY  1985   Laser tx [facility]   Family History  Problem Relation Age of Onset  . Kidney disease Mother        Kidney failure  . Heart disease Mother   . Heart disease Father        CAD  . Cancer Brother  Head & Neck Tumor  . Alcohol abuse Brother   . Diabetes Maternal Aunt   . Diabetes Maternal Uncle   . Diabetes Maternal Grandmother   . Cancer Maternal Grandmother   . Diabetes Maternal Grandfather    Social History   Socioeconomic History  . Marital status: Married    Spouse name: Heritage manager  . Number of children: 2  . Years of education: 71  . Highest education level: Not on file  Occupational History  . Occupation: Retired  Tobacco Use  . Smoking status: Never Smoker  . Smokeless tobacco: Never Used  Vaping Use  . Vaping Use: Never used  Substance and Sexual Activity  . Alcohol use: No  . Drug use: No  . Sexual activity: Not on file  Other Topics Concern  . Not on file  Social History Narrative   Lives at home with wife.   Caffeine use: daily   Social Determinants of Health   Financial Resource Strain: Low Risk   .  Difficulty of Paying Living Expenses: Not hard at all  Food Insecurity: No Food Insecurity  . Worried About Charity fundraiser in the Last Year: Never true  . Ran Out of Food in the Last Year: Never true  Transportation Needs: No Transportation Needs  . Lack of Transportation (Medical): No  . Lack of Transportation (Non-Medical): No  Physical Activity: Unknown  . Days of Exercise per Week: 0 days  . Minutes of Exercise per Session: Not on file  Stress: No Stress Concern Present  . Feeling of Stress : Not at all  Social Connections: Unknown  . Frequency of Communication with Friends and Family: Not on file  . Frequency of Social Gatherings with Friends and Family: Not on file  . Attends Religious Services: Not on file  . Active Member of Clubs or Organizations: Not on file  . Attends Archivist Meetings: Not on file  . Marital Status: Married    Tobacco Counseling Counseling given: Not Answered   Clinical Intake:  Pre-visit preparation completed: Yes        Diabetes: Yes (Followed by Endocrinology, [facility])  How often do you need to have someone help you when you read instructions, pamphlets, or other written materials from your doctor or pharmacy?: 1 - Never    Interpreter Needed?: No      Activities of Daily Living In your present state of health, do you have any difficulty performing the following activities: 11/03/2020  Hearing? N  Vision? N  Difficulty concentrating or making decisions? N  Walking or climbing stairs? N  Dressing or bathing? N  Doing errands, shopping? N  Preparing Food and eating ? N  Using the Toilet? N  In the past six months, have you accidently leaked urine? N  Do you have problems with loss of bowel control? N  Managing your Medications? N  Managing your Finances? N  Housekeeping or managing your Housekeeping? N  Some recent data might be hidden    Patient Care Team: Burnard Hawthorne, FNP as PCP - General  (Family Medicine) Rockey Situ Kathlene November, MD as Consulting Physician (Cardiology)  Indicate any recent Medical Services you may have received from other than [facility] providers in the past year (date may be approximate).     Assessment:   This is a routine wellness examination for Cleavon.  I connected with Jonanthony today by telephone and verified that I am speaking with the correct person using two identifiers. Location  patient: home Location provider: work Persons participating in the virtual visit: patient, Marine scientist.    I discussed the limitations, risks, security and privacy concerns of performing an evaluation and management service by telephone and the availability of in person appointments. The patient expressed understanding and verbally consented to this telephonic visit.    Interactive audio and video telecommunications were attempted between this provider and patient, however failed, due to patient having technical difficulties OR patient did not have access to video capability.  We continued and completed visit with audio only.  Some vital signs may be absent or patient reported.   Hearing/Vision screen  Hearing Screening   125Hz  250Hz  500Hz  1000Hz  2000Hz  3000Hz  4000Hz  6000Hz  8000Hz   Right ear:           Left ear:           Comments: Patient is able to hear conversational tones without difficulty.  No issues reported.  Vision Screening Comments: Visual acuity not assessed, virtual visit.  They have seen their ophthalmologist in the last 12 months.     Dietary issues and exercise activities discussed: Current Exercise Habits: The patient does not participate in regular exercise at present, Intensity: Mild Regular diet Good fluid intake   Goals Addressed            This Visit's Progress   . Maintain Healthy Lifestyle       Stay active Healthy low carb diet       Depression Screen PHQ 2/9 Scores 11/03/2020 03/04/2020 11/03/2014  PHQ - 2 Score 0 4 0  PHQ- 9 Score - 9 -     Fall Risk Fall Risk  11/03/2020 03/04/2020 11/03/2014  Falls in the past year? 0 0 Yes  Number falls in past yr: 0 0 1  Injury with Fall? 0 - No  Follow up Falls evaluation completed Falls evaluation completed Falls prevention discussed    [place]: Handrails in use when climbing stairs?Yes Home free of loose throw rugs in walkways, pet beds, electrical cords, etc? Yes  Adequate lighting in your home to reduce risk of falls? Yes   ASSISTIVE DEVICES UTILIZED TO PREVENT FALLS: Life alert? No  Use of a cane, walker or w/c? No  Grab bars in the bathroom? Yes   Shower chair or bench in shower? Yes  Elevated toilet seat or a handicapped toilet? No   TIMED UP AND GO: Was the test performed? No .    Cognitive Function:  Patient is alert and oriented x3.   Enjoys reading and watching the news. Denies difficulty focusing, making decisions, memory loss.  MMSE/6CIT deferred. Normal by direct communication/observation.    Immunizations Immunization History  Administered Date(s) Administered  . Fluad Quad(high Dose 65+) 03/04/2020  . Influenza Inj Mdck Quad Pf 03/06/2017, 04/17/2018  . Influenza,inj,Quad PF,6+ Mos 02/14/2014  . Influenza-Unspecified 03/06/2017  . PFIZER(Purple Top)SARS-COV-2 Vaccination 07/26/2019, 08/21/2019  . Pneumococcal Conjugate-13 04/30/2020  . Pneumococcal Polysaccharide-23 04/06/2011  . Zoster, Live 04/05/1996    TDAP status: Due, Education has been provided regarding the importance of this vaccine. Advised may receive this vaccine at local pharmacy or Health Dept. Aware to provide a copy of the vaccination record if obtained from local pharmacy or Health Dept. Verbalized acceptance and understanding. Deferred.   Health Maintenance Health Maintenance  Topic Date Due  . HEMOGLOBIN A1C  10/13/2020  . COVID-19 Vaccine (3 - Booster for Pfizer series) 11/19/2020 (Originally 01/21/2020)  . Zoster Vaccines- Shingrix (1  of 2)  02/03/2021 (Originally 04/05/1996)  . Fecal DNA (Cologuard)  08/20/2021 (Originally 04/05/1996)  . Pneumococcal Vaccine 19-63 Years old (1 of 2 - PPSV23) 11/03/2021 (Originally 06/25/2020)  . TETANUS/TDAP  11/03/2021 (Originally 04/05/1965)  . Hepatitis C Screening  11/03/2021 (Originally 04/05/1964)  . INFLUENZA VACCINE  12/28/2020  . OPHTHALMOLOGY EXAM  03/30/2021  . FOOT EXAM  08/20/2021  . PNA vac Low Risk Adult  Completed  . HPV VACCINES  Aged Out   Cologuard- patient verbalizes receiving the kit and plans to submit.   Lung Cancer Screening: (Low Dose CT Chest recommended if Age 62-80 years, 30 pack-year currently smoking OR have quit w/in 15years.) does not qualify.   Hepatitis C Screening: declined.   Vision Screening: Recommended annual ophthalmology exams for early detection of glaucoma and other disorders of the eye. Is the patient up to date with their annual eye exam?  Yes , [facility].  Dental Screening: Recommended annual dental exams for proper oral hygiene.  Community Resource Referral / Chronic Care Management: CRR required this visit?  No   CCM required this visit?  No      Plan:    Keep all routine maintenance appointments.   I have personally reviewed and noted the following in the patient's chart:   . Medical and social history . Use of alcohol, tobacco or illicit drugs  . Current medications and supplements including opioid prescriptions. Patient is not currently taking opioid prescriptions. . Functional ability and status . Nutritional status . Physical activity . Advanced directives . List of other physicians . Hospitalizations, surgeries, and ER visits in previous 12 months . Vitals . Screenings to include cognitive, depression, and falls . Referrals and appointments  In addition, I have reviewed and discussed with patient certain preventive protocols, quality metrics, and best practice recommendations. A written personalized care plan for preventive  services as well as general preventive health recommendations were provided to patient via mychart.     Varney Biles, LPN   08/03/2900

## 2020-11-16 DIAGNOSIS — E1042 Type 1 diabetes mellitus with diabetic polyneuropathy: Secondary | ICD-10-CM | POA: Diagnosis not present

## 2020-11-23 ENCOUNTER — Ambulatory Visit: Payer: Medicare PPO | Admitting: Cardiovascular Disease

## 2020-11-23 ENCOUNTER — Emergency Department
Admission: EM | Admit: 2020-11-23 | Discharge: 2020-11-23 | Disposition: A | Discharge status: Home / Self Care | Payer: Medicare PPO | Attending: Emergency Medicine | Admitting: Emergency Medicine

## 2020-11-23 ENCOUNTER — Other Ambulatory Visit: Payer: Self-pay

## 2020-11-23 ENCOUNTER — Encounter: Payer: Self-pay | Admitting: Cardiovascular Disease

## 2020-11-23 ENCOUNTER — Other Ambulatory Visit: Payer: Self-pay | Admitting: Family

## 2020-11-23 ENCOUNTER — Telehealth: Payer: Self-pay | Admitting: Cardiovascular Disease

## 2020-11-23 ENCOUNTER — Emergency Department: Payer: Medicare PPO

## 2020-11-23 ENCOUNTER — Encounter: Payer: Self-pay | Admitting: Intensive Care

## 2020-11-23 VITALS — BP 86/48 | HR 61 | Ht 68.0 in | Wt 202.0 lb

## 2020-11-23 DIAGNOSIS — Z794 Long term (current) use of insulin: Secondary | ICD-10-CM | POA: Diagnosis not present

## 2020-11-23 DIAGNOSIS — E1142 Type 2 diabetes mellitus with diabetic polyneuropathy: Secondary | ICD-10-CM | POA: Insufficient documentation

## 2020-11-23 DIAGNOSIS — E785 Hyperlipidemia, unspecified: Secondary | ICD-10-CM | POA: Diagnosis not present

## 2020-11-23 DIAGNOSIS — I25118 Atherosclerotic heart disease of native coronary artery with other forms of angina pectoris: Secondary | ICD-10-CM | POA: Diagnosis not present

## 2020-11-23 DIAGNOSIS — Z7982 Long term (current) use of aspirin: Secondary | ICD-10-CM | POA: Insufficient documentation

## 2020-11-23 DIAGNOSIS — Z7902 Long term (current) use of antithrombotics/antiplatelets: Secondary | ICD-10-CM | POA: Insufficient documentation

## 2020-11-23 DIAGNOSIS — E1140 Type 2 diabetes mellitus with diabetic neuropathy, unspecified: Secondary | ICD-10-CM | POA: Diagnosis not present

## 2020-11-23 DIAGNOSIS — I509 Heart failure, unspecified: Secondary | ICD-10-CM | POA: Diagnosis not present

## 2020-11-23 DIAGNOSIS — E782 Mixed hyperlipidemia: Secondary | ICD-10-CM

## 2020-11-23 DIAGNOSIS — I110 Hypertensive heart disease with heart failure: Secondary | ICD-10-CM | POA: Diagnosis not present

## 2020-11-23 DIAGNOSIS — R531 Weakness: Secondary | ICD-10-CM | POA: Diagnosis not present

## 2020-11-23 DIAGNOSIS — I152 Hypertension secondary to endocrine disorders: Secondary | ICD-10-CM

## 2020-11-23 DIAGNOSIS — I959 Hypotension, unspecified: Secondary | ICD-10-CM | POA: Insufficient documentation

## 2020-11-23 DIAGNOSIS — Z951 Presence of aortocoronary bypass graft: Secondary | ICD-10-CM

## 2020-11-23 DIAGNOSIS — E1069 Type 1 diabetes mellitus with other specified complication: Secondary | ICD-10-CM

## 2020-11-23 DIAGNOSIS — N179 Acute kidney failure, unspecified: Secondary | ICD-10-CM

## 2020-11-23 DIAGNOSIS — I2510 Atherosclerotic heart disease of native coronary artery without angina pectoris: Secondary | ICD-10-CM | POA: Diagnosis not present

## 2020-11-23 DIAGNOSIS — E860 Dehydration: Secondary | ICD-10-CM | POA: Insufficient documentation

## 2020-11-23 DIAGNOSIS — G319 Degenerative disease of nervous system, unspecified: Secondary | ICD-10-CM | POA: Diagnosis not present

## 2020-11-23 DIAGNOSIS — Z79899 Other long term (current) drug therapy: Secondary | ICD-10-CM | POA: Insufficient documentation

## 2020-11-23 DIAGNOSIS — E1065 Type 1 diabetes mellitus with hyperglycemia: Secondary | ICD-10-CM | POA: Diagnosis not present

## 2020-11-23 LAB — CBC
HCT: 40.3 % (ref 39.0–52.0)
Hemoglobin: 13.6 g/dL (ref 13.0–17.0)
MCH: 31.1 pg (ref 26.0–34.0)
MCHC: 33.7 g/dL (ref 30.0–36.0)
MCV: 92.2 fL (ref 80.0–100.0)
Platelets: 238 10*3/uL (ref 150–400)
RBC: 4.37 MIL/uL (ref 4.22–5.81)
RDW: 12.7 % (ref 11.5–15.5)
WBC: 9.5 10*3/uL (ref 4.0–10.5)
nRBC: 0 % (ref 0.0–0.2)

## 2020-11-23 LAB — BASIC METABOLIC PANEL
Anion gap: 7 (ref 5–15)
BUN: 27 mg/dL — ABNORMAL HIGH (ref 8–23)
CO2: 27 mmol/L (ref 22–32)
Calcium: 8.7 mg/dL — ABNORMAL LOW (ref 8.9–10.3)
Chloride: 104 mmol/L (ref 98–111)
Creatinine, Ser: 1.44 mg/dL — ABNORMAL HIGH (ref 0.61–1.24)
GFR, Estimated: 51 mL/min — ABNORMAL LOW (ref >60)
Glucose, Bld: 126 mg/dL — ABNORMAL HIGH (ref 70–99)
Potassium: 4 mmol/L (ref 3.5–5.1)
Sodium: 138 mmol/L (ref 135–145)

## 2020-11-23 LAB — TROPONIN I (HIGH SENSITIVITY): Troponin I (High Sensitivity): 17 ng/L (ref <18)

## 2020-11-23 LAB — CBG MONITORING, ED: Glucose-Capillary: 128 mg/dL — ABNORMAL HIGH (ref 70–99)

## 2020-11-23 MED ORDER — LOSARTAN POTASSIUM 25 MG PO TABS: 25.0000 mg | ORAL_TABLET | Freq: Every day | ORAL | 2 refills | Status: DC

## 2020-11-23 MED ORDER — LACTATED RINGERS IV BOLUS
1000.0000 mL | Freq: Once | INTRAVENOUS | Status: AC
  Administered 2020-11-23: 1000 mL via INTRAVENOUS

## 2020-11-23 NOTE — Telephone Encounter (Signed)
Pt c/o BP issue: STAT if pt c/o blurred vision, one-sided weakness or slurred speech  1. What are your last 5 BP readings?  Sat night -197/121, 189/121 Yesterday- 191/99 This morning- 145/77  2. Are you having any other symptoms (ex. Dizziness, headache, blurred vision, passed out)? "Burning up" and having panic attacks  3. What is your BP issue? elevated

## 2020-11-23 NOTE — Telephone Encounter (Signed)
Was able to reach back out to pt's wife Heritage manager (DPR approved). She is concern for pt's (Todd Clay) HTN and not on any BP meds other then lasix. She reports over the weekend BP extremely elevated  Sat night -197/121, 189/121 Yesterday- 191/99 This morning- 145/77  Reports she consulted with her daughter who is a Marine scientist and they both tried to convince pt to seek the ED d/t HTN, diaphoretic, weak, felt "panic" and felt better sitting outside in cooler air but pt refused to be taken tot he ED. Reports that the past two days she had some "old" Losartan on hand and had Todd Clay take those, which lower his BP.  Advised that on pt's med list it listed Losartan 25 mg daily, Cathera reports that Todd Clay told him that his oncologist took him off the medication a month or so ago, advised based on clinic notes and his current med list, there is no indication that this Losartan was d/c. Cathera reports Todd Clay has been going to his appt along and she has noticed some confusion with instructions and what has been discuss, she reports she will be coming to future appts for clarifications. Stated she felt maybe the Losartan caused him issues with "weakness or balance", but that is not seen documented in chart as well, but wife reports there has been other BP meds and it could have been one of them.   Cathera verbalized will have Todd Clay start back on Losartan 25 mg daily and monitor for side affects and monitor his BP. Advised that if he BP spikes again and if he develops symptoms try to convince him to seek the ED for medical attention, maybe help with nurse daughter will get him to be evaluated. Was able to place for next available appt on 12/23/2020 at 8:00 am with Dr. Rockey Situ and also added to wait list for sooner appt. Todd Clay is thankful for call back and clarifying the daily Losartan.   Advised to call back with any further concerns, will call pt/wife back if a sooner appt arrives.

## 2020-11-23 NOTE — ED Notes (Signed)
Patient doing MRI screening at this time. 

## 2020-11-23 NOTE — ED Notes (Signed)
Patient at MRI 

## 2020-11-23 NOTE — Telephone Encounter (Signed)
Attempted to reach out to pt via phone, unable to make contact, LMTCB regarding BP concerns.

## 2020-11-23 NOTE — ED Provider Notes (Addendum)
[facility] Emergency Department Provider Note  ____________________________________________   Event Date/Time   First MD Initiated Contact with Patient 11/23/20 1742     (approximate)  I have reviewed the triage vital signs and the nursing notes.   HISTORY  Chief Complaint Hypotension   HPI Todd Clay is a 75 y.o. male with past medical history of CAD status post CABG stenting in 05, poorly controlled DM, PAD, carotid stenosis, CHF, HTN, HDL, PVD, right-sided retinal detachment and some weakness in her right eye chronic, anemia, and anxiety who presents after being referred from cardiology clinic where he was seen earlier today for assessment of low blood pressure and dizziness specifically with standing and inability to walk normally today.  Patient states this started about 2 days ago when he felt very hot and took his blood pressure and is in the 190s.  He took a dose of his lisinopril that he had not been taking for several months.  His blood pressure then came down to the 130s however however he started feeling hot again later yesterday evening takes more lisinopril as pressure was noted to be again 190s.  Today he states he did not take any blood pressure medicines but could not walk normally and felt he was dizzy and stumbling and feeling very fatigued.  At PCPs office his BP was 86/48.  He states he has not had any associated recent chest pain, cough, shortness of breath, abdominal pain, back pain, headache, vision changes, vertigo, rash, focal extremity weakness numbness or tingling, urinary symptoms or any other clear associated sick symptoms.  States he has not actually fallen.  He denies any complaints at this time other than some fatigue and states he feels like he cannot walk normally.         Past Medical History:  Diagnosis Date   CAD (coronary artery disease)    Depression    Diabetes mellitus with complication ([place])    Diabetic  neuropathy ([place])    History of chickenpox    HLD (hyperlipidemia)    HTN (hypertension)    MI (myocardial infarction) ([place])    x3   Osteomyelitis ([place])    Left 2nd and 3rd metatarsal   Proliferative diabetic retinopathy ([place])    OU   PVD (peripheral vascular disease) ([place])    Retinal detachment    OD    Patient Active Problem List   Diagnosis Date Noted   Eosinophil count raised 10/16/2020   Anxiety 05/06/2020   Anemia 04/15/2020   OSA (obstructive sleep apnea) 03/09/2020   Erectile dysfunction due to arterial insufficiency 01/07/2019   Myopia with astigmatism and presbyopia, bilateral 11/08/2017   Right retinal detachment 11/08/2017   Stable proliferative diabetic retinopathy of both eyes associated with type 1 diabetes mellitus ([place]) 11/08/2017   Hyperlipidemia due to type 1 diabetes mellitus ([place]) 03/06/2017   Hypertension associated with diabetes ([place]) 03/06/2017   Acute congestive heart failure ([place]) 10/11/2016   Dysplastic nevi 07/30/2015   Weakness 01/28/2015   Jerking movements of extremities 01/28/2015   Malar rash 11/03/2014   Diabetic polyneuropathy ([place]) 03/20/2014   Toe amputation status 03/20/2014   History of retinal detachment 03/20/2014   History of osteomyelitis 11/18/2013   Hx of CABG 08/26/2013   Hyperlipidemia 08/26/2013   Orthostatic hypotension 08/26/2013   Diabetes type 1, uncontrolled ([place]) 08/13/2013   CAD (coronary artery disease) 08/13/2013   Diabetes mellitus with complication ([place]) 58/85/0277   Screen for colon cancer 08/13/2013  Fatigue 08/13/2013   Epiretinal membrane, both eyes 09/19/2012   Pseudophakia of both eyes 09/19/2012   Proliferative diabetic retinopathy, both eyes ([place]) 09/19/2012    Past Surgical History:  Procedure Laterality Date   AMPUTATION OF REPLICATED TOES     Left 2nd and 3rd metatarsal   CARDIAC CATHETERIZATION     CORONARY ANGIOPLASTY     Patient has a total of 5 stents placed.   CORONARY ARTERY BYPASS  GRAFT  1998   x1 vessel @ [facility]   RETINAL DETACHMENT SURGERY     Laser   STENTS     x 5    VITREOUS RETINAL SURGERY  1985   Laser tx OU    Prior to Admission medications   Medication Sig Start Date End Date Taking? Authorizing Provider  aspirin EC 81 MG tablet Take 81 mg by mouth daily. Swallow whole.    [provider]  clopidogrel (PLAVIX) 75 MG tablet Take 1 tablet by mouth once daily 09/17/20   Minna Merritts, MD  Continuous Blood Gluc Receiver (FREESTYLE LIBRE 14 DAY READER) DEVI USE TO TEST BLOOD SUGAR 10/12/18   [provider]  Continuous Blood Gluc Sensor (FREESTYLE LIBRE 14 DAY SENSOR) MISC USE 1 SENSOR EVERY 14 DAYS 10/29/18   [provider]  furosemide (LASIX) 20 MG tablet Take 1 tablet (20 mg total) by mouth daily as needed. 05/06/20   Burnard Hawthorne, FNP  gabapentin (NEURONTIN) 300 MG capsule TAKE THREE CAPSULES BY MOUTH THREE TIMES DAILY 09/21/15   Rubbie Battiest, RN  insulin regular (NOVOLIN R RELION) 100 units/mL injection Inject 0.06 mLs (6 Units total) into the skin 3 (three) times daily before meals. 06/19/14   Phadke, Karsten Ro, MD  losartan (COZAAR) 25 MG tablet Take 1 tablet (25 mg total) by mouth daily. 11/23/20   Minna Merritts, MD  sertraline (ZOLOFT) 50 MG tablet Take 1 tablet (50 mg total) by mouth at bedtime. 05/06/20   Burnard Hawthorne, FNP  simvastatin (ZOCOR) 80 MG tablet TAKE ONE TABLET BY MOUTH IN THE EVENING AT 6 PM 12/26/16   Cook, Jayce G, DO  TRESIBA FLEXTOUCH 100 UNIT/ML SOPN FlexTouch Pen Inject 14 Units into the skin daily.  12/21/17   [provider]    Allergies Patient has no known allergies.  Family History  Problem Relation Age of Onset   Kidney disease Mother        Kidney failure   Heart disease Mother    Heart disease Father        CAD   Cancer Brother        Head & Neck Tumor   Alcohol abuse Brother    Diabetes Maternal Aunt    Diabetes Maternal Uncle    Diabetes Maternal Grandmother     Cancer Maternal Grandmother    Diabetes Maternal Grandfather     Social History Social History   Tobacco Use   Smoking status: Never   Smokeless tobacco: Never  Vaping Use   Vaping Use: Never used  Substance Use Topics   Alcohol use: No   Drug use: No    Review of Systems  Review of Systems  Constitutional:  Negative for chills and fever.  HENT:  Negative for sore throat.   Eyes:  Negative for pain.  Respiratory:  Negative for cough and stridor.   Cardiovascular:  Negative for chest pain.  Gastrointestinal:  Negative for vomiting.  Genitourinary:  Negative for dysuria.  Musculoskeletal:  Negative  for myalgias.  Skin:  Negative for rash.  Neurological:  Positive for dizziness and weakness. Negative for seizures, loss of consciousness and headaches.  Psychiatric/Behavioral:  Negative for suicidal ideas.   All other systems reviewed and are negative.    ____________________________________________   PHYSICAL EXAM:  VITAL SIGNS: ED Triage Vitals  Enc Vitals Group     BP 11/23/20 1731 95/62     Pulse Rate 11/23/20 1731 63     Resp 11/23/20 1731 18     Temp 11/23/20 1731 98.2 F (36.8 C)     Temp Source 11/23/20 1731 Oral     SpO2 11/23/20 1731 97 %     Weight 11/23/20 1733 202 lb (91.6 kg)     Height 11/23/20 1733 5\' 8"  (1.727 m)     Head Circumference --      Peak Flow --      Pain Score 11/23/20 1732 7     Pain Loc --      Pain Edu? --      Excl. in [place]? --    Vitals:   11/23/20 1800 11/23/20 1924  BP: 121/62 (!) 177/65  Pulse: 65 76  Resp: 18 18  Temp:    SpO2: 98% 99%   Physical Exam Vitals and nursing note reviewed.  Constitutional:      Appearance: He is well-developed.  HENT:     Head: Normocephalic and atraumatic.     Right Ear: External ear normal.     Left Ear: External ear normal.     Nose: Nose normal.  Eyes:     Conjunctiva/sclera: Conjunctivae normal.  Cardiovascular:     Rate and Rhythm: Normal rate and regular rhythm.     Heart  sounds: No murmur heard. Pulmonary:     Effort: Pulmonary effort is normal. No respiratory distress.     Breath sounds: Normal breath sounds.  Abdominal:     Palpations: Abdomen is soft.     Tenderness: There is no abdominal tenderness.  Musculoskeletal:     Cervical back: Neck supple.  Skin:    General: Skin is warm and dry.     Capillary Refill: Capillary refill takes less than 2 seconds.  Neurological:     Mental Status: He is alert and oriented to person, place, and time.  Psychiatric:        Mood and Affect: Mood normal.    Cranial nerves II through XII grossly intact.  No pronator drift.  No finger dysmetria.  Symmetric 5/5 strength of all extremities.  Sensation intact to light touch in all extremities.  On trial of ambulation patient stated he felt extremely dizzy only attempted to stand patient.  Standing blood pressure obtained at 127/49 which improved back to 95/62 when patient laid back down.  Trial of ambulation deferred.   ____________________________________________   LABS (all labs ordered are listed, but only abnormal results are displayed)  Labs Reviewed  BASIC METABOLIC PANEL - Abnormal; Notable for the following components:      Result Value   Glucose, Bld 126 (*)    BUN 27 (*)    Creatinine, Ser 1.44 (*)    Calcium 8.7 (*)    GFR, Estimated 51 (*)    All other components within normal limits  CBG MONITORING, ED - Abnormal; Notable for the following components:   Glucose-Capillary 128 (*)    All other components within normal limits  CBC  URINALYSIS, COMPLETE (UACMP) WITH MICROSCOPIC  TROPONIN I (HIGH SENSITIVITY)  TROPONIN I (HIGH SENSITIVITY)   ____________________________________________  EKG  Obtained in clinic and reviewed by myself obtained in clinic and brought with patient show sinus rhythm with a PVC with unremarkable intervals, normal axis without clear evidence of acute ischemia or other  significant Arrhythmia. ____________________________________________  RADIOLOGY  ED MD interpretation: MRI brain without evidence of CVA or other clear acute intracranial process.  Official radiology report(s): MR BRAIN WO CONTRAST  Result Date: 11/23/2020 CLINICAL DATA:  Hypotension.  Weakness. EXAM: MRI HEAD WITHOUT CONTRAST TECHNIQUE: Multiplanar, multiecho pulse sequences of the brain and surrounding structures were obtained without intravenous contrast. COMPARISON:  02/20/2015 FINDINGS: Brain: Diffusion imaging does not show any acute or subacute infarction. There is age related volume loss. No focal abnormality affects the brainstem or cerebellum. Cerebral hemispheres show minimal small vessel change of the white matter. No cortical or large vessel territory infarction. No mass lesion, hemorrhage, hydrocephalus or extra-axial collection. Vascular: Major vessels at the base of the brain show flow. Skull and upper cervical spine: Negative Sinuses/Orbits: Scattered opacified frontal and ethmoid sinuses. Orbits do not show any acute finding. Previous scleral banding on the right. Other: Posterior nasal and nasopharyngeal polyps. Some fluid in the mastoid air cells on the left, possibly related to compromise of the left Eustacian tube. IMPRESSION: No acute intracranial finding. Age related atrophy. Mild chronic small-vessel change of the cerebral hemispheric white matter. Nasal and nasopharyngeal polyps. Electronically Signed   By: [place] Chimes M.D.   On: 11/23/2020 19:02    ____________________________________________   PROCEDURES  Procedure(s) performed (including Critical Care):  .1-3 Lead EKG Interpretation  Date/Time: 11/23/2020 8:33 PM Performed by: Lucrezia Starch, MD Authorized by: Lucrezia Starch, MD     Interpretation: normal     ECG rate assessment: normal     Rhythm: sinus rhythm     Conduction: normal     ____________________________________________   INITIAL  IMPRESSION / ASSESSMENT AND PLAN / ED COURSE     Patient presents with above to history exam for assessment of low blood pressures and dizziness and feeling like he cannot walk after being fired from his cardiology visit in the setting of about 2 days of taking blood pressure medicines for high blood pressures and feeling hot.  On arrival patient's BP is noted to be low at 95/62.  He is not orthostatic on my initial assessment although has otherwise a nonfocal supine neuro exam.  He denies any associated chest pain.  Is certainly possible symptoms are related to orthostasis from persistent effects from antihypertensives taken over the last 2 days although he states he has not taken them today.  Will check ECG and troponin to assess for any evidence of arrhythmia or ischemia in the ED.  In addition is certainly possible given rapid changes in blood pressures he has caused a small stroke and while he does not have a focal deficit on supine neuro exam given he is unable to ambulate at this time we will obtain MR brain to rule out CVA.  We will also check for metabolic derangements and a CBC to check for symptomatic anemia contributing to symptoms.  EKG reviewed by myself and troponin of 17 is not consistent with ACS and there is no evidence of significant arrhythmia.  CBC shows no leukocytosis and hemoglobin 13.6 compared to 12.42 months ago and not consistent with symptomatic anemia.  BMP shows creatinine of 1.44 compared to 1.162 months however any mild AKI.  No other significant electrode or metabolic  derangements.  Patient given IV fluids and on recheck his blood pressure was not noted to be orthostatic although pressures ranged between 140 and 200.  Patient stating he felt much better and was able to stand and take a couple steps without difficulty.  Overall his impression is some dizziness and orthostatic hypotension secondary to very labile blood pressures and effects of antihypertensives taken by  patient over the weekend.  He also states he has not been eating or drinking much.  Does not appear that he caused a stroke although suspect his mild kidney injury some low blood pressures.  Advised him to hold off on any additional doses of blood pressure medicine until he can have a chance to talk with his cardiologist and PCP and to drink plenty of fluids.  Advised him to move slowly from lying to sitting to standing and if he experiences any vision changes, new weakness, chest pain, shortness of breath, change in urine output or any other acute sick symptoms he must return immediately to emergency room.  However given improvement in orthostatic vitals with patient stating he felt little better on reassessment but otherwise no evidence of CVA or ACS and very low suspicion for acute infectious process I think patient is stable for close outpatient follow-up and further management as labile blood pressures.  Also advised him that his PCP check his kidney function and complaints.  Patient and wife are amenable with plan.  Discharged stable condition.  Strict and precautions advised and discussed.         ____________________________________________   FINAL CLINICAL IMPRESSION(S) / ED DIAGNOSES  Final diagnoses:  Dehydration  Hypotension, unspecified hypotension type    Medications  lactated ringers bolus 1,000 mL (0 mLs Intravenous Stopped 11/23/20 1930)     ED Discharge Orders     None        Note:  This document was prepared using Dragon voice recognition software and may include unintentional dictation errors.    Lucrezia Starch, MD 11/23/20 2033    Lucrezia Starch, MD 11/24/20 [phone]

## 2020-11-23 NOTE — ED Notes (Signed)
Patient given cola and crackers as requested by patient.

## 2020-11-23 NOTE — Addendum Note (Signed)
Addended by: Wynema Birch on: 11/23/2020 02:19 PM   Modules accepted: Orders

## 2020-11-23 NOTE — ED Triage Notes (Signed)
Patient presents from PCP appointment with low blood pressure. Patient was told by his PCP a few months ago to quit taking b/p medication due to it making him unsteady/dizzy. Saturday evening 11/21/20 patient found his b/p to be elevated so he took two 25mg  lisinopril and also took two Sunday evening 11/22/20. Patient reports feeling weak. EKG completed at MD office and in system. No need to repeat EKG per stephanie first nurse.

## 2020-11-23 NOTE — Progress Notes (Signed)
Cardiology Office Note  Date:  11/23/2020   ID:  EARLEY GROBE, DOB [birth year], MRN [id number]  PCP:  Burnard Hawthorne, FNP   Chief Complaint  Patient presents with   Blood pressure problems     Patient c/o pain behind ear, felling like he is "drunk," dizziness/lightheaded, weakness in legs and unable to walk. Patient took Losartan 25 mg at 6 pm and 11 pm on Saturday, June 25 th and Sunday, June 26th. Medications reviewed by the patient verbally.     HPI:  Todd Clay is a pleasant 75 year old gentleman with history of  CAD,  CABG December 1997  LIMA to the LAD at [facility],  several catheterizations since that time with stents placed,  stent to the left circumflex and RCA in 2005,  Hx of poorly controlled diabetes, with PAD toe  ostiomyelitis requiring PICC line and long-term antibiotics,  Prior history of orthostasis Carotid u/s 40 to 59% on the left Chronic Diastolic CHF PVCs and bigeminal pattern in the past 2 office visits who presents for routine followup of his coronary artery disease, leg swelling and shortness of breath  Last seen in clinic by myself July 2021  Sat night, diaphoretic, sweating, "felt hot" Sat outside Pressure 190s, Took losartan 25 x2 Could not sleep, up until 5 Am,  Slept until noon saunday 706 systolic Could not eat lunch or dinner, no appetitis Feeling hot again last night, BP elevated again , 237 systolic , Took another losartan  Today, no medications,  Stumbling today, not himself, could not walk Wife had difficulty getting him out of the house Wheel chair today  In the office today cold clammy, not himself, reports " something on right" Back is all sweaty Did not take any medications Sugars 117 in the office Sugars also stable on past 2 nights when he had similar episodes  Denies any chest pain concerning for angina No shortness of breath, denies any open sores, no leg edema  Lab work reviewed in detail Stable BMP April  2022 A1c 7.43 months ago  Weight stable  EKG personally reviewed by myself on todays visit Normal sinus rhythm rate 61 bpm PVC no significant ST-T wave changes  Past medical history reviewed Echocardiogram  showing preserved LV function, diastolic relaxation abnormality  Cardiac catheterization in 2008 at [facility] showed 99% proximal RCA disease with endeavor DES placed 2.5 x 15 mm, also with 60% distal RCA disease, 100% OM1 disease followed by 50%, 80% proximal LAD disease, 100% followed by 90% mid LAD disease, 100% diagonal disease Normal ejection fraction at that time estimated greater than 60%   Prior cardiac catheterization in July 2005 details 60% proximal RCA followed by 50%, 90% and 70% distal RCA disease 90% mid left circumflex disease, 90% OM 3 disease, 70% proximal LAD disease extending to the mid vessel, 100% mid LAD disease, 70% distal LAD disease   Cardiac catheterization July 2000 with 70% proximal RCA disease, 90% mid RCA disease, 70% distal RCA disease, 70% proximal left circumflex disease, 70% OM1 disease, 100% OM 2 disease, 70% proximal LAD to mid LAD disease, 70% mid and distal LAD disease, 100% diagonal disease     PMH:   has a past medical history of CAD (coronary artery disease), Depression, Diabetes mellitus with complication ([place]), Diabetic neuropathy ([place]), History of chickenpox, HLD (hyperlipidemia), HTN (hypertension), MI (myocardial infarction) ([place]), Osteomyelitis ([place]), Proliferative diabetic retinopathy ([place]), PVD (peripheral vascular disease) ([place]), and Retinal detachment.  PSH:    Past Surgical History:  Procedure Laterality Date   AMPUTATION OF REPLICATED TOES     Left 2nd and 3rd metatarsal   CARDIAC CATHETERIZATION     CORONARY ANGIOPLASTY     Patient has a total of 5 stents placed.   CORONARY ARTERY BYPASS GRAFT  1998   x1 vessel @ [facility]   RETINAL DETACHMENT SURGERY     Laser   STENTS     x 5    VITREOUS RETINAL SURGERY  1985   Laser tx OU     Current Outpatient Medications  Medication Sig Dispense Refill   aspirin EC 81 MG tablet Take 81 mg by mouth daily. Swallow whole.     clopidogrel (PLAVIX) 75 MG tablet Take 1 tablet by mouth once daily 90 tablet 2   Continuous Blood Gluc Receiver (FREESTYLE LIBRE 14 DAY READER) DEVI USE TO TEST BLOOD SUGAR     Continuous Blood Gluc Sensor (FREESTYLE LIBRE 14 DAY SENSOR) MISC USE 1 SENSOR EVERY 14 DAYS     furosemide (LASIX) 20 MG tablet Take 1 tablet (20 mg total) by mouth daily as needed. 90 tablet 1   gabapentin (NEURONTIN) 300 MG capsule TAKE THREE CAPSULES BY MOUTH THREE TIMES DAILY 270 capsule 1   insulin regular (NOVOLIN R RELION) 100 units/mL injection Inject 0.06 mLs (6 Units total) into the skin 3 (three) times daily before meals. 10 mL 2   losartan (COZAAR) 25 MG tablet Take 1 tablet (25 mg total) by mouth daily. 90 tablet 2   sertraline (ZOLOFT) 50 MG tablet Take 1 tablet (50 mg total) by mouth at bedtime. 90 tablet 3   simvastatin (ZOCOR) 80 MG tablet TAKE ONE TABLET BY MOUTH IN THE EVENING AT 6 PM 90 tablet 2   TRESIBA FLEXTOUCH 100 UNIT/ML SOPN FlexTouch Pen Inject 14 Units into the skin daily.   3   No current facility-administered medications for this visit.    Allergies:   Patient has no known allergies.   Social History:  The patient  reports that he has never smoked. He has never used smokeless tobacco. He reports that he does not drink alcohol and does not use drugs.   Family History:   family history includes Alcohol abuse in his brother; Cancer in his brother and maternal grandmother; Diabetes in his maternal aunt, maternal grandfather, maternal grandmother, and maternal uncle; Heart disease in his father and mother; Kidney disease in his mother.    Review of Systems: Review of Systems  Constitutional:  Positive for malaise/fatigue.       Weakness, inability to stand  HENT: Negative.    Respiratory: Negative.    Cardiovascular: Negative.   Gastrointestinal:  Negative.   Musculoskeletal: Negative.   Neurological: Negative.   Psychiatric/Behavioral: Negative.    All other systems reviewed and are negative.  PHYSICAL EXAM: VS:  BP (!) 86/48 (BP Location: Left Arm, Patient Position: Sitting, Cuff Size: Normal)   Pulse 61   Ht 5\' 8"  (1.727 m)   Wt 202 lb (91.6 kg)   SpO2 98%   BMI 30.71 kg/m  , BMI Body mass index is 30.71 kg/m.  Constitutional:  oriented to person, place, and time. No distress.  HENT:  Head: Grossly normal Eyes:  no discharge. No scleral icterus.  Neck: No JVD, no carotid bruits  Cardiovascular: Regular rate and rhythm, no murmurs appreciated Pulmonary/Chest: Clear to auscultation bilaterally, no wheezes or rails Abdominal: Soft.  no distension.  no tenderness.  Musculoskeletal: Normal range of motion Neurological:  normal  muscle tone. Coordination normal. No atrophy Skin: Skin warm and dry Psychiatric: normal affect, pleasant   Recent Labs: 03/04/2020: TSH 1.29 09/21/2020: ALT 15; BUN 19; Creatinine, Ser 1.16; Hemoglobin 12.4; Platelets 191; Potassium 4.0; Sodium 137    Lipid Panel Lab Results  Component Value Date   CHOL 137 04/30/2020   HDL 50.20 04/30/2020   LDLCALC 70 04/30/2020   TRIG 85.0 04/30/2020      Wt Readings from Last 3 Encounters:  11/23/20 202 lb (91.6 kg)  11/03/20 202 lb (91.6 kg)  10/12/20 202 lb 8 oz (91.9 kg)     ASSESSMENT AND PLAN:  Diaphoresis/hypotension Concerning for sepsis Unable to exclude hypovolemia Sugar stable 117 in the office, EKG stable Very thready very low blood pressure, difficulty palpating bilaterally Manual cuff just via radial artery palpation likely 75 systolic, unable to auscultate using stethoscope -Recommend transfer to emergency room, Fluids, lab work, blood cultures, troponins Her EKG essentially benign, less likely angina Given long history of poor controlled diabetes, prior history of infections, high risk of bacteremia.  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 No changes to the medications were made.  PVCs Asymptomatic  Anxiety on zoloft    Total encounter time more than 35 minutes  Greater than 50% was spent in counseling and coordination of care with the patient   No orders of the defined types were placed in this encounter.    Signed, Esmond Plants, M.D., Ph.D. 11/23/2020  [place], [place]

## 2020-11-26 DIAGNOSIS — I10 Essential (primary) hypertension: Secondary | ICD-10-CM | POA: Insufficient documentation

## 2020-11-26 DIAGNOSIS — E119 Type 2 diabetes mellitus without complications: Secondary | ICD-10-CM | POA: Insufficient documentation

## 2020-11-26 DIAGNOSIS — I25119 Atherosclerotic heart disease of native coronary artery with unspecified angina pectoris: Secondary | ICD-10-CM | POA: Insufficient documentation

## 2020-11-27 DIAGNOSIS — Z794 Long term (current) use of insulin: Secondary | ICD-10-CM | POA: Diagnosis not present

## 2020-11-27 DIAGNOSIS — I6529 Occlusion and stenosis of unspecified carotid artery: Secondary | ICD-10-CM | POA: Diagnosis not present

## 2020-11-27 DIAGNOSIS — E1151 Type 2 diabetes mellitus with diabetic peripheral angiopathy without gangrene: Secondary | ICD-10-CM | POA: Diagnosis not present

## 2020-11-27 DIAGNOSIS — I25119 Atherosclerotic heart disease of native coronary artery with unspecified angina pectoris: Secondary | ICD-10-CM | POA: Diagnosis not present

## 2020-11-27 DIAGNOSIS — E1169 Type 2 diabetes mellitus with other specified complication: Secondary | ICD-10-CM | POA: Diagnosis not present

## 2020-11-27 DIAGNOSIS — I2510 Atherosclerotic heart disease of native coronary artery without angina pectoris: Secondary | ICD-10-CM | POA: Diagnosis not present

## 2020-11-27 DIAGNOSIS — I160 Hypertensive urgency: Secondary | ICD-10-CM | POA: Diagnosis not present

## 2020-11-27 DIAGNOSIS — M869 Osteomyelitis, unspecified: Secondary | ICD-10-CM | POA: Diagnosis not present

## 2020-11-27 DIAGNOSIS — Z89429 Acquired absence of other toe(s), unspecified side: Secondary | ICD-10-CM | POA: Diagnosis not present

## 2020-11-27 DIAGNOSIS — I10 Essential (primary) hypertension: Secondary | ICD-10-CM | POA: Diagnosis not present

## 2020-12-01 NOTE — Telephone Encounter (Signed)
Attempted to reach pt's wife via phone, unable to make contact, LDM on call phone (DPR approved). Dr. Rockey Situ wanted to reach out to see how Todd Clay BP/HR were trending.   Pt seen in office 6/27 after phone encounter regarding BP, pt was then sent to [facility] ED for hypotension, then shortly discharge  Noted in pt's chart he is now seeing [facility], had appt 7/1, noted med changes at that appt with Start carvedilol 6.25 mg twice per day per Dr. Michiel Cowboy, also did EKG, labs, and ECHO order.  Advised wife Todd Clay to call office back if able to report pt's overall well-being and BP numbers, questioning if they are changing cardiologist to [facility]?

## 2020-12-02 DIAGNOSIS — I25119 Atherosclerotic heart disease of native coronary artery with unspecified angina pectoris: Secondary | ICD-10-CM | POA: Diagnosis not present

## 2020-12-02 DIAGNOSIS — E1169 Type 2 diabetes mellitus with other specified complication: Secondary | ICD-10-CM | POA: Diagnosis not present

## 2020-12-02 DIAGNOSIS — Z794 Long term (current) use of insulin: Secondary | ICD-10-CM | POA: Diagnosis not present

## 2020-12-02 DIAGNOSIS — E785 Hyperlipidemia, unspecified: Secondary | ICD-10-CM | POA: Diagnosis not present

## 2020-12-02 DIAGNOSIS — I10 Essential (primary) hypertension: Secondary | ICD-10-CM | POA: Diagnosis not present

## 2020-12-04 ENCOUNTER — Ambulatory Visit: Payer: Medicare PPO | Admitting: Medical

## 2020-12-09 ENCOUNTER — Encounter: Payer: Self-pay | Admitting: Family

## 2020-12-09 ENCOUNTER — Other Ambulatory Visit: Payer: Self-pay

## 2020-12-09 ENCOUNTER — Ambulatory Visit: Payer: Medicare PPO | Admitting: Family

## 2020-12-09 VITALS — BP 118/82 | HR 58 | Temp 97.9°F | Ht 68.0 in | Wt 201.4 lb

## 2020-12-09 DIAGNOSIS — I152 Hypertension secondary to endocrine disorders: Secondary | ICD-10-CM

## 2020-12-09 DIAGNOSIS — G4733 Obstructive sleep apnea (adult) (pediatric): Secondary | ICD-10-CM | POA: Diagnosis not present

## 2020-12-09 DIAGNOSIS — E1159 Type 2 diabetes mellitus with other circulatory complications: Secondary | ICD-10-CM

## 2020-12-09 NOTE — Assessment & Plan Note (Addendum)
Discussed at length constellation of symptoms including HA, fatigue, HTN and untreated OSA. Discussed risk of atrial fib, MI, CVA  As well as risks that sedation pose to driving safely. He understands these risks, symptoms and is very willing to see pulmonology again to initiate [place]. We also discussed increasing physical activity with walking to increase energy and for overall health, prevent deconditioning. Neuroimaging revealed no acute process 11/23/20. Advised patient to let me know if HA becomes more frequent, more severe.   Will follow.

## 2020-12-09 NOTE — Progress Notes (Signed)
Subjective:    Patient ID: Todd Clay, male    DOB: [birth year], 75 y.o.   MRN: [id number]  CC: Todd Clay is a 75 y.o. male who presents today for follow up.   HPI: Wife is present via cell phone on speak in room  HTN- compliant with coreg 6.25mg . No cp, dizziness.   Drinking more water past couple of days and plans to stay more consistent with this. He is not longer on losartan.   He complains of intermittent HA x 4 weeks, about 4 HA's in total.  No particular exacerbating factor. middle between the eyes. He HA today. It is 'mild'. No vision changes, congestion, ear pain, tinnitus, neck pain, fever. HA doesn't awaken in his sleep.  Compliant with 81mg  asa.  Drinks 16 ounce of diet coke,  12 ounce coffee daily.   No h/o HA.   H/o OSA, he doesn't wear Cipap. Endorses daily fatigue and excessive daytime sleepiness. He sleeps most of the day. Sedentary without regular exercise.    Moderate OSA diagnosed 01/2018. Consult with [facility] cardiology and established care with Dr Midge Minium, 11/27/20 for two episodes of HTN urgency and subsequent hypotension. renal artery [place] as ordered by Dr Midge Minium 12/02/20 showed no evidence of renal artery stenosis bilaterally.  repeat echocardiogram scheduled 01/2021.  Started coreg 6.25 qd.   Crt 1 11/27/20  ED visit 11/23/20 for dehydration after hypotension in Dr Donivan Scull office MRI brain 11/23/20 showed no acute findings , age related atrophy  DM- follows with Dr Ladell Pier   HISTORY:  Past Medical History:  Diagnosis Date   CAD (coronary artery disease)    Depression    Diabetes mellitus with complication ([place])    Diabetic neuropathy ([place])    History of chickenpox    HLD (hyperlipidemia)    HTN (hypertension)    MI (myocardial infarction) ([place])    x3   Osteomyelitis ([place])    Left 2nd and 3rd metatarsal   Proliferative diabetic retinopathy ([place])    OU   PVD (peripheral vascular disease) ([place])    Retinal detachment    OD   Past Surgical  History:  Procedure Laterality Date   AMPUTATION OF REPLICATED TOES     Left 2nd and 3rd metatarsal   CARDIAC CATHETERIZATION     CORONARY ANGIOPLASTY     Patient has a total of 5 stents placed.   CORONARY ARTERY BYPASS GRAFT  1998   x1 vessel @ [facility]   RETINAL DETACHMENT SURGERY     Laser   STENTS     x 5    VITREOUS RETINAL SURGERY  1985   Laser tx [facility]   Family History  Problem Relation Age of Onset   Kidney disease Mother        Kidney failure   Heart disease Mother    Heart disease Father        CAD   Cancer Brother        Head & Neck Tumor   Alcohol abuse Brother    Diabetes Maternal Aunt    Diabetes Maternal Uncle    Diabetes Maternal Grandmother    Cancer Maternal Grandmother    Diabetes Maternal Grandfather     Allergies: Patient has no known allergies. Current Outpatient Medications on File Prior to Visit  Medication Sig Dispense Refill   aspirin EC 81 MG tablet Take 81 mg by mouth daily. Swallow whole.     carvedilol (COREG) 6.25 MG tablet Take by mouth.  clopidogrel (PLAVIX) 75 MG tablet Take 1 tablet by mouth once daily 90 tablet 2   Continuous Blood Gluc Receiver (FREESTYLE LIBRE 14 DAY READER) DEVI USE TO TEST BLOOD SUGAR     Continuous Blood Gluc Sensor (FREESTYLE LIBRE 14 DAY SENSOR) MISC USE 1 SENSOR EVERY 14 DAYS     furosemide (LASIX) 20 MG tablet Take 1 tablet (20 mg total) by mouth daily as needed. 90 tablet 1   gabapentin (NEURONTIN) 300 MG capsule TAKE THREE CAPSULES BY MOUTH THREE TIMES DAILY 270 capsule 1   insulin regular (NOVOLIN R RELION) 100 units/mL injection Inject 0.06 mLs (6 Units total) into the skin 3 (three) times daily before meals. 10 mL 2   sertraline (ZOLOFT) 50 MG tablet Take 1 tablet (50 mg total) by mouth at bedtime. 90 tablet 3   simvastatin (ZOCOR) 80 MG tablet TAKE ONE TABLET BY MOUTH IN THE EVENING AT 6 PM 90 tablet 2   TRESIBA FLEXTOUCH 100 UNIT/ML SOPN FlexTouch Pen Inject 14 Units into the skin daily.   3   No current  facility-administered medications on file prior to visit.    Social History   Tobacco Use   Smoking status: Never   Smokeless tobacco: Never  Vaping Use   Vaping Use: Never used  Substance Use Topics   Alcohol use: No   Drug use: No    Review of Systems  Constitutional:  Positive for fatigue. Negative for chills and fever.  Eyes:  Negative for visual disturbance.  Respiratory:  Negative for cough.   Cardiovascular:  Negative for chest pain and palpitations.  Gastrointestinal:  Negative for nausea and vomiting.  Neurological:  Positive for headaches.     Objective:    BP 118/82 (BP Location: Right Arm, Patient Position: Sitting, Cuff Size: Large)   Pulse (!) 58   Temp 97.9 F (36.6 C) (Oral)   Ht 5\' 8"  (1.727 m)   Wt 201 lb 6.4 oz (91.4 kg)   SpO2 97%   BMI 30.62 kg/m  BP Readings from Last 3 Encounters:  12/09/20 118/82  11/23/20 (!) 181/69  11/23/20 (!) 86/48   Wt Readings from Last 3 Encounters:  12/09/20 201 lb 6.4 oz (91.4 kg)  11/23/20 202 lb (91.6 kg)  11/23/20 202 lb (91.6 kg)    Physical Exam Vitals reviewed.  Constitutional:      Appearance: He is well-developed.  HENT:     Right Ear: Hearing normal.     Left Ear: Hearing normal.     Mouth/Throat:     Pharynx: Uvula midline. No posterior oropharyngeal erythema.  Eyes:     General: Lids are normal. Lids are everted, no foreign bodies appreciated.     Conjunctiva/sclera: Conjunctivae normal.     Pupils: Pupils are equal, round, and reactive to light.     Comments: Normal fundus bilaterally.  Cardiovascular:     Rate and Rhythm: Regular rhythm.     Heart sounds: Normal heart sounds.  Pulmonary:     Effort: Pulmonary effort is normal. No respiratory distress.     Breath sounds: Normal breath sounds. No wheezing, rhonchi or rales.  Lymphadenopathy:     Head:     Right side of head: No submental, submandibular, tonsillar, preauricular, posterior auricular or occipital adenopathy.     Left side of  head: No submental, submandibular, tonsillar, preauricular, posterior auricular or occipital adenopathy.     Cervical: No cervical adenopathy.  Skin:    General: Skin is warm and  dry.  Neurological:     Mental Status: He is alert.     Cranial Nerves: No cranial nerve deficit.     Sensory: No sensory deficit.     Deep Tendon Reflexes:     Reflex Scores:      Bicep reflexes are 2+ on the right side and 2+ on the left side.      Patellar reflexes are 2+ on the right side and 2+ on the left side.    Comments: Grip equal and strong bilateral upper extremities. Gait strong and steady.   Psychiatric:        Speech: Speech normal.        Behavior: Behavior normal.       Assessment & Plan:   Problem List Items Addressed This Visit       Cardiovascular and Mediastinum   Hypertension associated with diabetes ([place])    Excellent control today. Reviewed overall lability of blood pressure. Advised to continue coreg as prescribed by Dr Midge Minium. Discussed concern for untreated OSA making HTN difficult to treat.        Relevant Medications   carvedilol (COREG) 6.25 MG tablet     Respiratory   OSA (obstructive sleep apnea) - Primary    Discussed at length constellation of symptoms including HA, fatigue, HTN and untreated OSA. Discussed risk of atrial fib, MI, CVA  As well as risks that sedation pose to driving safely. He understands these risks, symptoms and is very willing to see pulmonology again to initiate [place]. We also discussed increasing physical activity with walking to increase energy and for overall health, prevent deconditioning. Neuroimaging revealed no acute process 11/23/20. Advised patient to let me know if HA becomes more frequent, more severe.   Will follow.        Relevant Orders   Ambulatory referral to Pulmonology     I have discontinued Keano L. Jeudy's losartan. I am also having him maintain his insulin regular, gabapentin, simvastatin, [employer] FlexTouch, [employer] 14 Day Reader, [employer] 14 Day Sensor, aspirin EC, furosemide, sertraline, clopidogrel, and carvedilol.   No orders of the defined types were placed in this encounter.   Return precautions given.   Risks, benefits, and alternatives of the medications and treatment plan prescribed today were discussed, and patient expressed understanding.   Education regarding symptom management and diagnosis given to patient on AVS.  Continue to follow with Burnard Hawthorne, FNP for routine health maintenance.   Patience Musca and I agreed with plan.   Mable Paris, FNP

## 2020-12-09 NOTE — Patient Instructions (Signed)
I am concerned that difficult to treat blood pressure, daytime fatigue, headache.  Referral to pulmonology  Let us know if you dont hear back within a week in regards to an appointment being scheduled.

## 2020-12-09 NOTE — Assessment & Plan Note (Addendum)
Excellent control today. Reviewed overall lability of blood pressure. Advised to continue coreg as prescribed by Dr Midge Minium. Discussed concern for untreated OSA making HTN difficult to treat.

## 2020-12-11 ENCOUNTER — Other Ambulatory Visit: Payer: Self-pay | Admitting: Family

## 2020-12-11 DIAGNOSIS — I5031 Acute diastolic (congestive) heart failure: Secondary | ICD-10-CM

## 2020-12-22 ENCOUNTER — Telehealth: Payer: Self-pay | Admitting: Family

## 2020-12-22 NOTE — Telephone Encounter (Signed)
I called patient's wife & she is concerned about his BP as well as she feels something is pressing on his ear drum bc he also can't hear. She stated that she was screaming to him on the phone earlier today. He also had a fall last night 90/45 was his BP. BS was a little on the high side at this time she said & was not low. There was no injury with fall, but she stated that patient is not steady on his feet. She is concerned that his BP is dropping due to some type of pressure on his ear drum?  She wants someone to review his MRI again or maybe additional imaging or something is needed? His BP this am was slightly high 150/95 this am when sitting. Pt's wife does not know what it has been during the day bc she has been at work. Standing is when it drops so suddenly. He doesn't move fast, so she stated that he doesn't stand up terribly fast anyway. She thinks there is something going on behind right ear due to his hearing going so fast & now can't hear her speaking from one chair to another. She feels that all these problems are related to one another somehow. She is grasping at straws she stated & his care is becoming hard on her. She does not understand this sudden hearing loss & wants some answers to BP fluctuations too. She feels like they have just put  band ain on issue by giving him fluids when he went ED a few weeks ago.   I advised that I would send you this about reviewing MRI, but that he needed to be seen at ED for evaluation. I explained that we could not do today additional imaging or here in clinic could not give him fluids if that was what was needed. I know at last ED visit patient was very dehydrated. Pt's wife refused to go to ED hear & does not have confidence in their care. She said that when she gets home with patient if he is worse or having blood pressure issues with fluctuating this evening she will take him to [facility] ED. She really just wants some advise & to have someone review the MRI again.  I advised that ED is what I advised & what anyone here would bc we do not have the capability to immediate treat. Pt's wife verbalized understanding of this & I told her I would call back to f/u after speaking with you.

## 2020-12-22 NOTE — Telephone Encounter (Signed)
Pt wife called she wanted to go over his MRI and his blood pressure are ranging 193/131 and drops to 90/45

## 2020-12-23 ENCOUNTER — Ambulatory Visit: Payer: Medicare PPO | Admitting: Cardiovascular Disease

## 2020-12-23 ENCOUNTER — Ambulatory Visit: Payer: Medicare PPO | Admitting: Family

## 2020-12-23 ENCOUNTER — Other Ambulatory Visit: Payer: Self-pay | Admitting: Family

## 2020-12-23 ENCOUNTER — Telehealth: Payer: Self-pay | Admitting: Family

## 2020-12-23 DIAGNOSIS — H9190 Unspecified hearing loss, unspecified ear: Secondary | ICD-10-CM

## 2020-12-23 NOTE — Telephone Encounter (Signed)
See phone note OV sh 01/06/21

## 2020-12-23 NOTE — Telephone Encounter (Signed)
Call pt  Please advise that I did review MRI brain 11/23/20   No acute infarction Age related volume loss Posterior nasal polyp and possible left eustachian tube dysfunction   In setting of hearing loss, I would advise we go ahead and consult ENT for formal testing and surveillance of nasal polyps  We will talk in more detail when patient comes in the office 01/06/21  In regards to blood pressure, as we discussed at length, I am concerned untreated sleep apnea playing a role, Please keep upcoming appt with pulmonology 01/22/21

## 2020-12-23 NOTE — Telephone Encounter (Signed)
I spoke with patient's wife & she was very agreeable to patient seeing ENT. She stated that he does have very loud breathing & that he cannot eat with mouth closed. Pt does definitely plan to keep pulmonology appointment 8/26/. I did still advise that both patient & wife be tested for Covid. I reminded her that [facility] was still an option & same day PCR testing. Pt stated that she may consider or get two at home tests.

## 2020-12-25 DIAGNOSIS — E1042 Type 1 diabetes mellitus with diabetic polyneuropathy: Secondary | ICD-10-CM | POA: Diagnosis not present

## 2021-01-06 ENCOUNTER — Ambulatory Visit: Payer: Medicare PPO | Admitting: Family

## 2021-01-18 DIAGNOSIS — H6122 Impacted cerumen, left ear: Secondary | ICD-10-CM | POA: Diagnosis not present

## 2021-01-18 DIAGNOSIS — J339 Nasal polyp, unspecified: Secondary | ICD-10-CM | POA: Diagnosis not present

## 2021-01-18 DIAGNOSIS — H93292 Other abnormal auditory perceptions, left ear: Secondary | ICD-10-CM | POA: Diagnosis not present

## 2021-01-21 ENCOUNTER — Telehealth: Payer: Self-pay

## 2021-01-21 NOTE — Telephone Encounter (Signed)
Called and left voicemail in regards to if patient has had a sleep study before? I gave a call back number.

## 2021-01-22 ENCOUNTER — Encounter: Payer: Self-pay | Admitting: Primary Care

## 2021-01-22 ENCOUNTER — Other Ambulatory Visit: Payer: Self-pay

## 2021-01-22 ENCOUNTER — Ambulatory Visit (INDEPENDENT_AMBULATORY_CARE_PROVIDER_SITE_OTHER): Payer: Medicare PPO | Admitting: Primary Care

## 2021-01-22 VITALS — BP 108/80 | HR 57 | Temp 97.5°F | Ht 68.0 in | Wt 198.2 lb

## 2021-01-22 DIAGNOSIS — G4733 Obstructive sleep apnea (adult) (pediatric): Secondary | ICD-10-CM | POA: Diagnosis not present

## 2021-01-22 NOTE — Progress Notes (Signed)
Reviewed and agree with assessment/plan.   Chesley Mires, MD [facility] Pulmonary/Critical Care 01/22/2021, 3:23 PM Pager:  [phone]

## 2021-01-22 NOTE — Progress Notes (Signed)
$'@Patient'M$  ID: Todd Clay, male    DOB: 09/25/1945, 75 y.o.   MRN: [id number]  No chief complaint on file.   Referring provider: Burnard Hawthorne, FNP  HPI: 75 year old male, never smoked.  Past medical history significant for congestive heart failure, coronary artery disease, hypertension, obstructive sleep apnea, type 1 diabetes, hyperlipidemia.  01/22/2021 Patient presents today for sleep consult.  He has a history of untreated sleep apnea.  PCP referred here for sleep eval d.t elevated blood pressure. He reports symptoms of snoring, restless sleep, daytime fatigue.  He had a sleep study in September 2019 that showed moderate obstructive sleep apnea, he was never started on CPAP therapy.  He is interested in treatment options.  We discussed treatment options including weight loss, oral appliance, CPAP therapy or referral to ENT for possible surgical options.  He has been following with ENT for nasal polyps and will be starting prednisone and nasal spray.  He does not appear interested in inspire device however his wife would like him to speak with ear nose and throat for further regarding this.  He wishes to proceed with CPAP therapy at this time.  [place] 13   Sleep study 02/26/18 HST- AHI 17.7/hr, SpO2 low 81%   No Known Allergies  Immunization History  Administered Date(s) Administered   Fluad Quad(high Dose 65+) 03/04/2020   Influenza Inj Mdck Quad Pf 03/06/2017, 04/17/2018   Influenza,inj,Quad PF,6+ Mos 02/14/2014   Influenza-Unspecified 03/06/2017   PFIZER(Purple Top)SARS-COV-2 Vaccination 07/26/2019, 08/21/2019   Pneumococcal Conjugate-13 04/30/2020   Pneumococcal Polysaccharide-23 04/06/2011   Zoster, Live 04/05/1996    Past Medical History:  Diagnosis Date   CAD (coronary artery disease)    Depression    Diabetes mellitus with complication (HCC)    Diabetic neuropathy ([place])    History of chickenpox    HLD (hyperlipidemia)    HTN (hypertension)    MI  (myocardial infarction) ([place])    x3   Osteomyelitis ([place])    Left 2nd and 3rd metatarsal   Proliferative diabetic retinopathy ([place])    OU   PVD (peripheral vascular disease) ([place])    Retinal detachment    OD    Tobacco History: Social History   Tobacco Use  Smoking Status Never  Smokeless Tobacco Never   Counseling given: Not Answered   Outpatient Medications Prior to Visit  Medication Sig Dispense Refill   aspirin EC 81 MG tablet Take 81 mg by mouth daily. Swallow whole.     carvedilol (COREG) 6.25 MG tablet Take by mouth.     clopidogrel (PLAVIX) 75 MG tablet Take 1 tablet by mouth once daily 90 tablet 2   Continuous Blood Gluc Receiver (FREESTYLE LIBRE 14 DAY READER) DEVI USE TO TEST BLOOD SUGAR     Continuous Blood Gluc Sensor (FREESTYLE LIBRE 14 DAY SENSOR) MISC USE 1 SENSOR EVERY 14 DAYS     furosemide (LASIX) 20 MG tablet TAKE 1 TABLET BY MOUTH ONCE DAILY AS NEEDED 90 tablet 0   gabapentin (NEURONTIN) 300 MG capsule TAKE THREE CAPSULES BY MOUTH THREE TIMES DAILY 270 capsule 1   insulin regular (NOVOLIN R RELION) 100 units/mL injection Inject 0.06 mLs (6 Units total) into the skin 3 (three) times daily before meals. 10 mL 2   sertraline (ZOLOFT) 50 MG tablet Take 1 tablet (50 mg total) by mouth at bedtime. 90 tablet 3   simvastatin (ZOCOR) 80 MG tablet TAKE ONE TABLET BY MOUTH IN THE EVENING AT 6 PM 90 tablet 2  TRESIBA FLEXTOUCH 100 UNIT/ML SOPN FlexTouch Pen Inject 14 Units into the skin daily.   3   No facility-administered medications prior to visit.    Review of Systems  Review of Systems  Constitutional:  Positive for fatigue.  HENT:  Positive for sinus pressure.   Respiratory: Negative.    Psychiatric/Behavioral:  Positive for sleep disturbance.     Physical Exam  BP 108/80 (BP Location: Left Arm, Patient Position: Sitting, Cuff Size: Normal)   Pulse (!) 57   Temp (!) 97.5 F (36.4 C) (Oral)   Ht '5\' 8"'$  (1.727 m)   Wt 198 lb 3.2 oz (89.9 kg)   SpO2  95%   BMI 30.14 kg/m  Physical Exam Constitutional:      Appearance: Normal appearance.  HENT:     Head: Normocephalic and atraumatic.     Nose:     Comments: Left sided nasal polyps     Mouth/Throat:     Mouth: Mucous membranes are moist.     Pharynx: Oropharynx is clear.  Cardiovascular:     Rate and Rhythm: Normal rate and regular rhythm.  Pulmonary:     Effort: Pulmonary effort is normal.     Breath sounds: Normal breath sounds.  Neurological:     General: No focal deficit present.     Mental Status: He is alert and oriented to person, place, and time. Mental status is at baseline.  Psychiatric:        Mood and Affect: Mood normal.        Behavior: Behavior normal.        Thought Content: Thought content normal.        Judgment: Judgment normal.     Lab Results:  CBC    Component Value Date/Time   WBC 9.5 11/23/2020 1803   RBC 4.37 11/23/2020 1803   HGB 13.6 11/23/2020 1803   HGB 11.1 (L) 07/25/2013 0531   HCT 40.3 11/23/2020 1803   HCT 32.8 (L) 07/25/2013 0531   PLT 238 11/23/2020 1803   PLT 197 07/25/2013 0531   MCV 92.2 11/23/2020 1803   MCV 88 07/25/2013 0531   MCH 31.1 11/23/2020 1803   MCHC 33.7 11/23/2020 1803   RDW 12.7 11/23/2020 1803   RDW 13.5 07/25/2013 0531   LYMPHSABS 2.3 09/21/2020 1528   LYMPHSABS 1.9 07/25/2013 0531   MONOABS 0.4 09/21/2020 1528   MONOABS 0.5 07/25/2013 0531   EOSABS 0.4 09/21/2020 1528   EOSABS 0.5 07/25/2013 0531   BASOSABS 0.0 09/21/2020 1528   BASOSABS 0.0 07/25/2013 0531    BMET    Component Value Date/Time   NA 138 11/23/2020 1803   NA 136 01/28/2015 1623   NA 135 (L) 07/25/2013 0531   K 4.0 11/23/2020 1803   K 4.1 07/25/2013 0531   CL 104 11/23/2020 1803   CL 101 07/25/2013 0531   CO2 27 11/23/2020 1803   CO2 30 07/25/2013 0531   GLUCOSE 126 (H) 11/23/2020 1803   GLUCOSE 176 (H) 07/25/2013 0531   BUN 27 (H) 11/23/2020 1803   BUN 17 01/28/2015 1623   BUN 21 (H) 07/25/2013 0531   CREATININE 1.44 (H)  11/23/2020 1803   CREATININE 1.43 (H) 07/25/2013 0531   CALCIUM 8.7 (L) 11/23/2020 1803   CALCIUM 8.4 (L) 07/25/2013 0531   GFRNONAA 51 (L) 11/23/2020 1803   GFRNONAA 50 (L) 07/25/2013 0531   GFRAA 101 01/28/2015 1623   GFRAA 58 (L) 07/25/2013 0531    BNP No results found  for: BNP  ProBNP No results found for: PROBNP  Imaging: No results found.   Assessment & Plan:   OSA (obstructive sleep apnea) - Patient had a home study in 2019 that showed moderate obstructive sleep apnea.  Patient was never started on CPAP therapy.  He is now open to discussing his options.  We reviewed treatment including weight loss, oral appliance, CPAP therapy or referral to ear nose and throat for possible surgical options.  He currently follows with ear nose and throat for nasal polyps, advised he speak with them about surgical options versus inspire device.  In the meantime he has elected to proceed with CPAP therapy for treatment of his sleep apnea.  We will place DME order and he will follow-up with our office in 31 to 90 days after starting CPAP.     Martyn Ehrich, NP 01/22/2021

## 2021-01-22 NOTE — Patient Instructions (Addendum)
Recommendations: - Please discuss Inspire device with ENT if you are interested (Dr. Redmond Baseman or shoemaker) - When you receive CPAP device  aim to wear every night for 4-6 hours or longer  - Do not drive if experiencing excessive daytime fatigue or somnolence  Orders: - New CPAP start auto titrate 5-15cm h20  Follow-up - 31 to 90 days after starting CPAP (place recall for 3 months with Beth NP)  CPAP and BPAP Information CPAP and BPAP (also called BiPAP) are methods that use air pressure to keep your airways open and to help you breathe well. CPAP and BPAP use different amounts of pressure. Your health care provider will tell you whether CPAP or BPAP would be more helpful for you. CPAP stands for "continuous positive airway pressure." With CPAP, the amount of pressure stays the same while you breathe in (inhale) and out (exhale). BPAP stands for "bi-level positive airway pressure." With BPAP, the amount of pressure will be higher when you inhale and lower when you exhale. This allows you to take larger breaths. CPAP or BPAP may be used in the hospital, or your health care provider may want you to use it at home. You may need to have a sleep study before your healthcare provider can order a machine for you to use at home. What are the advantages? CPAP or BPAP can be helpful if you have: Sleep apnea. Chronic obstructive pulmonary disease (COPD). Heart failure. Medical conditions that cause muscle weakness, including muscular dystrophy or amyotrophic lateral sclerosis (ALS). Other problems that cause breathing to be shallow, weak, abnormal, or difficult. CPAP and BPAP are most commonly used for obstructive sleep apnea (OSA) to keepthe airways from collapsing when the muscles relax during sleep. What are the risks? Generally, this is a safe treatment. However, problems may occur, including: Irritated skin or skin sores if the mask does not fit properly. Dry or stuffy nose or nosebleeds. Dry  mouth. Feeling gassy or bloated. Sinus or lung infection if the equipment is not cleaned properly. When should CPAP or BPAP be used? In most cases, the mask only needs to be worn during sleep. Generally, the mask needs to be worn throughout the night and during any daytime naps. People with certain medical conditions may also need to wear the mask at other times, such as when they are awake. Follow instructions from your health care providerabout when to use the machine. What happens during CPAP or BPAP?  Both CPAP and BPAP are provided by a small machine with a flexible plastic tube that attaches to a plastic mask that you wear. Air is blown through the mask into your nose or mouth. The amount of pressure that is used to blow the air can be adjusted on the machine. Your health care provider will set the pressuresetting and help you find the best mask for you. Tips for using the mask Because the mask needs to be snug, some people feel trapped or closed-in (claustrophobic) when first using the mask. If you feel this way, you may need to get used to the mask. One way to do this is to hold the mask loosely over your nose or mouth and then gradually apply the mask more snugly. You can also gradually increase the amount of time that you use the mask. Masks are available in various types and sizes. If your mask does not fit well, talk with your health care provider about getting a different one. Some common types of masks include: Full  face masks, which fit over the mouth and nose. Nasal masks, which fit over the nose. Nasal pillow or prong masks, which fit into the nostrils. If you are using a mask that fits over your nose and you tend to breathe through your mouth, a chin strap may be applied to help keep your mouth closed. Use a skin barrier to protect your skin as told by your health care provider. Some CPAP and BPAP machines have alarms that may sound if the mask comes off or develops a leak. If you  have trouble with the mask, it is very important that you talk with your health care provider about finding a way to make the mask easier to tolerate. Do not stop using the mask. There could be a negative impact on your health if you stop using the mask. Tips for using the machine Place your CPAP or BPAP machine on a secure table or stand near an electrical outlet. Know where the on/off switch is on the machine. Follow instructions from your health care provider about how to set the pressure on your machine and when you should use it. Do not eat or drink while the CPAP or BPAP machine is on. Food or fluids could get pushed into your lungs by the pressure of the CPAP or BPAP. For home use, CPAP and BPAP machines can be rented or purchased through home health care companies. Many different brands of machines are available. Renting a machine before purchasing may help you find out which particular machine works well for you. Your health insurance company may also decide which machine you may get. Keep the CPAP or BPAP machine and attachments clean. Ask your health care provider for specific instructions. Check the humidifier if you have a dry stuffy nose or nosebleeds. Make sure it is working correctly. Follow these instructions at home: Take over-the-counter and prescription medicines only as told by your health care provider. Ask if you can take sinus medicine if your sinuses are blocked. Do not use any products that contain nicotine or tobacco. These products include cigarettes, chewing tobacco, and vaping devices, such as e-cigarettes. If you need help quitting, ask your health care provider. Keep all follow-up visits. This is important. Contact a health care provider if: You have redness or pressure sores on your head, face, mouth, or nose from the mask or head gear. You have trouble using the CPAP or BPAP machine. You cannot tolerate wearing the CPAP or BPAP mask. Someone tells you that you snore  even when wearing your CPAP or BPAP. Get help right away if: You have trouble breathing. You feel confused. Summary CPAP and BPAP are methods that use air pressure to keep your airways open and to help you breathe well. If you have trouble with the mask, it is very important that you talk with your health care provider about finding a way to make the mask easier to tolerate. Do not stop using the mask. There could be a negative impact to your health if you stop using the mask. Follow instructions from your health care provider about when to use the machine. This information is not intended to replace advice given to you by your health care provider. Make sure you discuss any questions you have with your healthcare provider. Document Revised: 04/24/2020 Document Reviewed: 04/24/2020 Elsevier Patient Education  2022 [employer].

## 2021-01-22 NOTE — Assessment & Plan Note (Signed)
-   Patient had a home study in 2019 that showed moderate obstructive sleep apnea.  Patient was never started on CPAP therapy.  He is now open to discussing his options.  We reviewed treatment including weight loss, oral appliance, CPAP therapy or referral to ear nose and throat for possible surgical options.  He currently follows with ear nose and throat for nasal polyps, advised he speak with them about surgical options versus inspire device.  In the meantime he has elected to proceed with CPAP therapy for treatment of his sleep apnea.  We will place DME order and he will follow-up with our office in 31 to 90 days after starting CPAP.

## 2021-02-04 DIAGNOSIS — E1042 Type 1 diabetes mellitus with diabetic polyneuropathy: Secondary | ICD-10-CM | POA: Diagnosis not present

## 2021-02-05 DIAGNOSIS — I25119 Atherosclerotic heart disease of native coronary artery with unspecified angina pectoris: Secondary | ICD-10-CM | POA: Diagnosis not present

## 2021-02-05 DIAGNOSIS — M869 Osteomyelitis, unspecified: Secondary | ICD-10-CM | POA: Diagnosis not present

## 2021-02-05 DIAGNOSIS — I10 Essential (primary) hypertension: Secondary | ICD-10-CM | POA: Diagnosis not present

## 2021-02-05 DIAGNOSIS — I700 Atherosclerosis of aorta: Secondary | ICD-10-CM | POA: Diagnosis not present

## 2021-02-05 DIAGNOSIS — E1069 Type 1 diabetes mellitus with other specified complication: Secondary | ICD-10-CM | POA: Diagnosis not present

## 2021-02-05 DIAGNOSIS — Z89429 Acquired absence of other toe(s), unspecified side: Secondary | ICD-10-CM | POA: Diagnosis not present

## 2021-02-05 DIAGNOSIS — E1169 Type 2 diabetes mellitus with other specified complication: Secondary | ICD-10-CM | POA: Diagnosis not present

## 2021-02-05 DIAGNOSIS — E1040 Type 1 diabetes mellitus with diabetic neuropathy, unspecified: Secondary | ICD-10-CM | POA: Diagnosis not present

## 2021-02-05 DIAGNOSIS — E103553 Type 1 diabetes mellitus with stable proliferative diabetic retinopathy, bilateral: Secondary | ICD-10-CM | POA: Diagnosis not present

## 2021-02-05 DIAGNOSIS — Z794 Long term (current) use of insulin: Secondary | ICD-10-CM | POA: Diagnosis not present

## 2021-02-05 DIAGNOSIS — E119 Type 2 diabetes mellitus without complications: Secondary | ICD-10-CM | POA: Diagnosis not present

## 2021-02-05 DIAGNOSIS — E1051 Type 1 diabetes mellitus with diabetic peripheral angiopathy without gangrene: Secondary | ICD-10-CM | POA: Diagnosis not present

## 2021-02-19 DIAGNOSIS — J301 Allergic rhinitis due to pollen: Secondary | ICD-10-CM | POA: Diagnosis not present

## 2021-02-19 DIAGNOSIS — J339 Nasal polyp, unspecified: Secondary | ICD-10-CM | POA: Diagnosis not present

## 2021-03-12 ENCOUNTER — Other Ambulatory Visit: Payer: Self-pay

## 2021-03-12 ENCOUNTER — Ambulatory Visit: Payer: Medicare PPO | Admitting: Family

## 2021-03-12 ENCOUNTER — Encounter: Payer: Self-pay | Admitting: Family

## 2021-03-12 VITALS — BP 120/68 | HR 52 | Ht 68.0 in | Wt 200.2 lb

## 2021-03-12 DIAGNOSIS — I25118 Atherosclerotic heart disease of native coronary artery with other forms of angina pectoris: Secondary | ICD-10-CM | POA: Diagnosis not present

## 2021-03-12 DIAGNOSIS — G4733 Obstructive sleep apnea (adult) (pediatric): Secondary | ICD-10-CM

## 2021-03-12 DIAGNOSIS — R55 Syncope and collapse: Secondary | ICD-10-CM

## 2021-03-12 DIAGNOSIS — Z23 Encounter for immunization: Secondary | ICD-10-CM | POA: Diagnosis not present

## 2021-03-12 DIAGNOSIS — I10 Essential (primary) hypertension: Secondary | ICD-10-CM

## 2021-03-12 DIAGNOSIS — E782 Mixed hyperlipidemia: Secondary | ICD-10-CM | POA: Diagnosis not present

## 2021-03-12 NOTE — Assessment & Plan Note (Signed)
Chronic ,stable. Continue asa, plavix, lipitor.

## 2021-03-12 NOTE — Assessment & Plan Note (Signed)
Blood pressure well controlled today and no increased dizziness, syncopal episodes since resumption of half tablet of 12.5 mg losartan.  He will continue Coreg 6.25 mg twice daily with losartan.  Close follow-up

## 2021-03-12 NOTE — Assessment & Plan Note (Signed)
anticipate will improve since stopping simvastatin 80 mg and starting atorvastatin 80 mg from cardiology. will check panel at follow up.

## 2021-03-12 NOTE — Progress Notes (Signed)
Subjective:    Patient ID: Todd Clay, male    DOB: 1945-08-29, 75 y.o.   MRN: [id number]  CC: Todd Clay is a 75 y.o. male who presents today for follow up.   HPI: Accompanied by wife today Wife is primary historian she describes today that she has been checking patient's blood pressure diligently at home.  She did not bring her blood pressure log with her.  She describes that blood pressure is good when he is standing, it is higher with use lying supine, and it drops low when he goes to stand Occasional feels dizzy after sitting for period of time. Last syncopal episode 2 weeks ago in ENT office after getting out of chair. He started snore and th Not associated with palpitations, CP, incontinence, thrashing around. After episode, he 'wakes right up'  and he is laughing and joking. He remembers the event.   Episodes are less frequent. Approx 3 episodes each week the past 3 weeks. 2/3 episodes without LOC.   He is only taking losartan half tablet 12.5mg .   He has seen [facility] due to nasal polyps        12/02/2020 no renal artery stenosis Echocardiogram 01/2021 LVEF 50-55%  Following with cardiology, Dr Midge Minium,  last seen 02/05/2021 for HTN, CAD.  Advised to continue Coreg 6.25 mg twice daily and restart losartan 12.5 mg daily.  He is compliant with aspirin, Plavix.  He was changed from simvastatin 80 mg to atorvastatin 80 mg by Dr. Michiel Cowboy 02/13/21  LDL 1 month ago 127.  OSA-he is yet to start CPAP as it has not arrived.  Endorses daytime fatigue   DM1-following with endocrinology.  Last seen July of this year.  No changes to his regimen at that time  HISTORY:  Past Medical History:  Diagnosis Date   CAD (coronary artery disease)    Depression    Diabetes mellitus with complication (HCC)    Diabetic neuropathy ([place])    History of chickenpox    HLD (hyperlipidemia)    HTN (hypertension)    MI (myocardial infarction) ([place])    x3   Osteomyelitis ([place])     Left 2nd and 3rd metatarsal   Proliferative diabetic retinopathy ([place])    OU   PVD (peripheral vascular disease) ([place])    Retinal detachment    OD   Past Surgical History:  Procedure Laterality Date   AMPUTATION OF REPLICATED TOES     Left 2nd and 3rd metatarsal   CARDIAC CATHETERIZATION     CORONARY ANGIOPLASTY     Patient has a total of 5 stents placed.   CORONARY ARTERY BYPASS GRAFT  1998   x1 vessel @ [facility]   RETINAL DETACHMENT SURGERY     Laser   STENTS     x 5    VITREOUS RETINAL SURGERY  1985   Laser tx [facility]   Family History  Problem Relation Age of Onset   Kidney disease Mother        Kidney failure   Heart disease Mother    Heart disease Father        CAD   Cancer Brother        Head & Neck Tumor   Alcohol abuse Brother    Diabetes Maternal Aunt    Diabetes Maternal Uncle    Diabetes Maternal Grandmother    Cancer Maternal Grandmother    Diabetes Maternal Grandfather     Allergies: Patient has no known allergies. Current  Outpatient Medications on File Prior to Visit  Medication Sig Dispense Refill   aspirin EC 81 MG tablet Take 81 mg by mouth daily. Swallow whole.     atorvastatin (LIPITOR) 80 MG tablet Take 1 tablet by mouth daily.     atorvastatin (LIPITOR) 80 MG tablet Take 80 mg by mouth daily.     carvedilol (COREG) 6.25 MG tablet Take 6.25 mg by mouth 2 (two) times daily.     clopidogrel (PLAVIX) 75 MG tablet Take 1 tablet by mouth once daily 90 tablet 2   Continuous Blood Gluc Receiver (FREESTYLE LIBRE 14 DAY READER) DEVI USE TO TEST BLOOD SUGAR     Continuous Blood Gluc Sensor (FREESTYLE LIBRE 14 DAY SENSOR) MISC USE 1 SENSOR EVERY 14 DAYS     furosemide (LASIX) 20 MG tablet TAKE 1 TABLET BY MOUTH ONCE DAILY AS NEEDED 90 tablet 0   gabapentin (NEURONTIN) 300 MG capsule TAKE THREE CAPSULES BY MOUTH THREE TIMES DAILY 270 capsule 1   insulin regular (NOVOLIN R RELION) 100 units/mL injection Inject 0.06 mLs (6 Units total) into the skin 3 (three) times  daily before meals. 10 mL 2   sertraline (ZOLOFT) 50 MG tablet Take 1 tablet (50 mg total) by mouth at bedtime. 90 tablet 3   TRESIBA FLEXTOUCH 100 UNIT/ML SOPN FlexTouch Pen Inject 14 Units into the skin daily.   3   No current facility-administered medications on file prior to visit.    Social History   Tobacco Use   Smoking status: Never   Smokeless tobacco: Never  Vaping Use   Vaping Use: Never used  Substance Use Topics   Alcohol use: No   Drug use: No    Review of Systems  Constitutional:  Negative for chills and fever.  Respiratory:  Negative for cough and shortness of breath.   Cardiovascular:  Negative for chest pain, palpitations and leg swelling.  Gastrointestinal:  Negative for nausea and vomiting.  Neurological:  Positive for dizziness and syncope.     Objective:    BP 120/68 (BP Location: Left Arm, Patient Position: Sitting, Cuff Size: Normal)   Pulse (!) 52   Ht 5\' 8"  (1.727 m)   Wt 200 lb 3.2 oz (90.8 kg)   SpO2 96%   BMI 30.44 kg/m  BP Readings from Last 3 Encounters:  03/12/21 120/68  01/22/21 108/80  12/09/20 118/82   Wt Readings from Last 3 Encounters:  03/12/21 200 lb 3.2 oz (90.8 kg)  01/22/21 198 lb 3.2 oz (89.9 kg)  12/09/20 201 lb 6.4 oz (91.4 kg)    Physical Exam Vitals reviewed.  Constitutional:      Appearance: He is well-developed.  Cardiovascular:     Rate and Rhythm: Regular rhythm.     Heart sounds: Normal heart sounds.  Pulmonary:     Effort: Pulmonary effort is normal. No respiratory distress.     Breath sounds: Normal breath sounds. No wheezing, rhonchi or rales.  Skin:    General: Skin is warm and dry.  Neurological:     Mental Status: He is alert.  Psychiatric:        Speech: Speech normal.        Behavior: Behavior normal.       Assessment & Plan:   Problem List Items Addressed This Visit       Cardiovascular and Mediastinum   CAD (coronary artery disease)    Chronic ,stable. Continue asa, plavix, lipitor.        Relevant  Medications   atorvastatin (LIPITOR) 80 MG tablet   atorvastatin (LIPITOR) 80 MG tablet   Primary hypertension    Blood pressure well controlled today and no increased dizziness, syncopal episodes since resumption of half tablet of 12.5 mg losartan.  He will continue Coreg 6.25 mg twice daily with losartan.  Close follow-up      Relevant Medications   atorvastatin (LIPITOR) 80 MG tablet   atorvastatin (LIPITOR) 80 MG tablet     Respiratory   OSA (obstructive sleep apnea)    Uncontrolled.  Question whether syncopal episodes related to fatigue, excessive daytime somnolence.  He has unfortunately not been able to get his CPAP machine yet.  He is currently waiting for this.        Other   Hyperlipidemia     anticipate will improve since stopping simvastatin 80 mg and starting atorvastatin 80 mg from cardiology. will check panel at follow up.       Relevant Medications   atorvastatin (LIPITOR) 80 MG tablet   atorvastatin (LIPITOR) 80 MG tablet   Syncopal episodes - Primary    Uncontrolled. Discussed if episode related to arrhythmia, atypical seizure, or sleep disorder ( untreated OSA, ? Narcolepsy). Presentation remains consistent with orthostatic hypotension however concerned that another etiology per above may be contributing. I advised patient to be extremely cautious as he goes from sitting to standing, drink plenty water.  Also advised her to start compression stockings.  Patient and his wife will let me know how he is doing.  Wife stated that she will call cardiology as it relates to pursuing Holter monitor.       Relevant Orders   Ambulatory referral to Neurology   Other Visit Diagnoses     Need for immunization against influenza       Relevant Orders   Flu Vaccine QUAD High Dose(Fluad) (Completed)        I have discontinued Tishawn L. Spraker's simvastatin. I am also having him maintain his insulin regular, gabapentin, [employer] FlexTouch, FreeStyle Libre  14 Day Reader, [employer] 14 Day Sensor, aspirin EC, sertraline, clopidogrel, carvedilol, furosemide, atorvastatin, and atorvastatin.   No orders of the defined types were placed in this encounter.   Return precautions given.   Risks, benefits, and alternatives of the medications and treatment plan prescribed today were discussed, and patient expressed understanding.   Education regarding symptom management and diagnosis given to patient on AVS.  Continue to follow with Burnard Hawthorne, FNP for routine health maintenance.   Patience Musca and I agreed with plan.   Mable Paris, FNP

## 2021-03-12 NOTE — Assessment & Plan Note (Signed)
Uncontrolled.  Question whether syncopal episodes related to fatigue, excessive daytime somnolence.  He has unfortunately not been able to get his CPAP machine yet.  He is currently waiting for this.

## 2021-03-12 NOTE — Patient Instructions (Signed)
Speak with Dr Michiel Cowboy regarding if appropriate to consider holter monitor when you call today  Referral to neurology  Let us know if you dont hear back within a week in regards to an appointment being scheduled.   Compression stockings  Be very slow and cautious when getting out of a chair or the bed as we discussed.  Dizziness Dizziness is a common problem. It is a feeling of unsteadiness or light-headedness. You may feel like you are about to faint. Dizziness can lead to injury if you stumble or fall. Anyone can become dizzy, but dizziness is more common in older adults. This condition can be caused by a number of things, including medicines, dehydration, or illness. Follow these instructions at home: Eating and drinking  Drink enough fluid to keep your urine pale yellow. This helps to keep you from becoming dehydrated. Try to drink more clear fluids, such as water. Do not drink alcohol. Limit your caffeine intake if told to do so by your health care provider. Check ingredients and nutrition facts to see if a food or beverage contains caffeine. Limit your salt (sodium) intake if told to do so by your health care provider. Check ingredients and nutrition facts to see if a food or beverage contains sodium. Activity  Avoid making quick movements. Rise slowly from chairs and steady yourself until you feel okay. In the morning, first sit up on the side of the bed. When you feel okay, stand slowly while you hold onto something until you know that your balance is good. If you need to stand in one place for a long time, move your legs often. Tighten and relax the muscles in your legs while you are standing. Do not drive or use machinery if you feel dizzy. Avoid bending down if you feel dizzy. Place items in your home so that they are easy for you to reach without leaning over. Lifestyle Do not use any products that contain nicotine or tobacco. These products include cigarettes, chewing tobacco,  and vaping devices, such as e-cigarettes. If you need help quitting, ask your health care provider. Try to reduce your stress level by using methods such as yoga or meditation. Talk with your health care provider if you need help to manage your stress. General instructions Watch your dizziness for any changes. Take over-the-counter and prescription medicines only as told by your health care provider. Talk with your health care provider if you think that your dizziness is caused by a medicine that you are taking. Tell a friend or a family member that you are feeling dizzy. If he or she notices any changes in your behavior, have this person call your health care provider. Keep all follow-up visits. This is important. Contact a health care provider if: Your dizziness does not go away or you have new symptoms. Your dizziness or light-headedness gets worse. You feel nauseous. You have reduced hearing. You have a fever. You have neck pain or a stiff neck. Your dizziness leads to an injury or a fall. Get help right away if: You vomit or have diarrhea and are unable to eat or drink anything. You have problems talking, walking, swallowing, or using your arms, hands, or legs. You feel generally weak. You have any bleeding. You are not thinking clearly or you have trouble forming sentences. It may take a friend or family member to notice this. You have chest pain, abdominal pain, shortness of breath, or sweating. Your vision changes or you develop a severe headache.  These symptoms may represent a serious problem that is an emergency. Do not wait to see if the symptoms will go away. Get medical help right away. Call your local emergency services (911 in the [place] Do not drive yourself to the hospital. Summary Dizziness is a feeling of unsteadiness or light-headedness. This condition can be caused by a number of things, including medicines, dehydration, or illness. Anyone can become dizzy, but  dizziness is more common in older adults. Drink enough fluid to keep your urine pale yellow. Do not drink alcohol. Avoid making quick movements if you feel dizzy. Monitor your dizziness for any changes. This information is not intended to replace advice given to you by your health care provider. Make sure you discuss any questions you have with your health care provider. Document Revised: 04/20/2020 Document Reviewed: 04/20/2020 Elsevier Patient Education  2022 [employer].

## 2021-03-12 NOTE — Assessment & Plan Note (Addendum)
Uncontrolled. Discussed if episode related to arrhythmia, atypical seizure, or sleep disorder ( untreated OSA, ? Narcolepsy). Presentation remains consistent with orthostatic hypotension however concerned that another etiology per above may be contributing. I advised patient to be extremely cautious as he goes from sitting to standing, drink plenty water.  Also advised her to start compression stockings.  Patient and his wife will let me know how he is doing.  Wife stated that she will call cardiology as it relates to pursuing Holter monitor.

## 2021-04-05 ENCOUNTER — Inpatient Hospital Stay: Payer: Medicare PPO | Attending: Oncology

## 2021-04-05 ENCOUNTER — Other Ambulatory Visit: Payer: Self-pay

## 2021-04-05 DIAGNOSIS — I10 Essential (primary) hypertension: Secondary | ICD-10-CM | POA: Diagnosis not present

## 2021-04-05 DIAGNOSIS — E119 Type 2 diabetes mellitus without complications: Secondary | ICD-10-CM | POA: Diagnosis not present

## 2021-04-05 DIAGNOSIS — D721 Eosinophilia, unspecified: Secondary | ICD-10-CM | POA: Diagnosis not present

## 2021-04-05 LAB — CBC WITH DIFFERENTIAL/PLATELET
Abs Immature Granulocytes: 0.03 10*3/uL (ref 0.00–0.07)
Basophils Absolute: 0 10*3/uL (ref 0.0–0.1)
Basophils Relative: 0 %
Eosinophils Absolute: 0.4 10*3/uL (ref 0.0–0.5)
Eosinophils Relative: 6 %
HCT: 35.5 % — ABNORMAL LOW (ref 39.0–52.0)
Hemoglobin: 12 g/dL — ABNORMAL LOW (ref 13.0–17.0)
Immature Granulocytes: 0 %
Lymphocytes Relative: 36 %
Lymphs Abs: 2.5 10*3/uL (ref 0.7–4.0)
MCH: 31 pg (ref 26.0–34.0)
MCHC: 33.8 g/dL (ref 30.0–36.0)
MCV: 91.7 fL (ref 80.0–100.0)
Monocytes Absolute: 0.5 10*3/uL (ref 0.1–1.0)
Monocytes Relative: 8 %
Neutro Abs: 3.5 10*3/uL (ref 1.7–7.7)
Neutrophils Relative %: 50 %
Platelets: 199 10*3/uL (ref 150–400)
RBC: 3.87 MIL/uL — ABNORMAL LOW (ref 4.22–5.81)
RDW: 13.1 % (ref 11.5–15.5)
WBC: 6.9 10*3/uL (ref 4.0–10.5)
nRBC: 0 % (ref 0.0–0.2)

## 2021-04-05 LAB — COMPREHENSIVE METABOLIC PANEL
ALT: 21 U/L (ref 0–44)
AST: 28 U/L (ref 15–41)
Albumin: 3.5 g/dL (ref 3.5–5.0)
Alkaline Phosphatase: 90 U/L (ref 38–126)
Anion gap: 8 (ref 5–15)
BUN: 24 mg/dL — ABNORMAL HIGH (ref 8–23)
CO2: 25 mmol/L (ref 22–32)
Calcium: 8.2 mg/dL — ABNORMAL LOW (ref 8.9–10.3)
Chloride: 102 mmol/L (ref 98–111)
Creatinine, Ser: 1.12 mg/dL (ref 0.61–1.24)
GFR, Estimated: >60 mL/min (ref >60)
Glucose, Bld: 135 mg/dL — ABNORMAL HIGH (ref 70–99)
Potassium: 3.9 mmol/L (ref 3.5–5.1)
Sodium: 135 mmol/L (ref 135–145)
Total Bilirubin: 0.5 mg/dL (ref 0.3–1.2)
Total Protein: 6.3 g/dL — ABNORMAL LOW (ref 6.5–8.1)

## 2021-04-07 ENCOUNTER — Telehealth: Payer: Self-pay | Admitting: Oncology

## 2021-04-07 NOTE — Telephone Encounter (Signed)
Wife called and wants that she would like for 11-21 appt to be a phone visit. Phone # is [phone]

## 2021-04-07 NOTE — Telephone Encounter (Signed)
Pt scheduled to see Sonia Baller, but she will not be available. Dr. Tasia Catchings will not be in clinic that week and she does not do phone visits, only Mychart. Dr. Tasia Catchings please advise.

## 2021-04-08 ENCOUNTER — Telehealth: Payer: Self-pay | Admitting: Oncology

## 2021-04-08 LAB — MISC LABCORP TEST (SEND OUT): Labcorp test code: 511444

## 2021-04-08 NOTE — Telephone Encounter (Signed)
Please schedule phone visit with Lauren on 11/21, if no room that day ok to move to another day with lauren. If he want to be scheduled with Dr. Tasia Catchings, it will need to be Mychart or in person. Please inform pt of updates.

## 2021-04-08 NOTE — Telephone Encounter (Signed)
Per MD--patient cannot have a telephone visit. Spoke with wife and she is ok to do a Mychart Visit if in the morning. Changed to virtual per patient and MD request--jcs

## 2021-04-09 ENCOUNTER — Other Ambulatory Visit: Payer: Self-pay | Admitting: Family

## 2021-04-09 DIAGNOSIS — I5031 Acute diastolic (congestive) heart failure: Secondary | ICD-10-CM

## 2021-04-19 ENCOUNTER — Telehealth: Payer: Self-pay | Admitting: Family

## 2021-04-19 ENCOUNTER — Inpatient Hospital Stay: Payer: Medicare PPO | Admitting: Oncology

## 2021-04-19 ENCOUNTER — Encounter: Payer: Self-pay | Admitting: Oncology

## 2021-04-19 ENCOUNTER — Other Ambulatory Visit: Payer: Self-pay | Admitting: Family

## 2021-04-19 ENCOUNTER — Inpatient Hospital Stay (HOSPITAL_BASED_OUTPATIENT_CLINIC_OR_DEPARTMENT_OTHER): Payer: Medicare PPO | Admitting: Oncology

## 2021-04-19 DIAGNOSIS — D721 Eosinophilia, unspecified: Secondary | ICD-10-CM

## 2021-04-19 DIAGNOSIS — I5031 Acute diastolic (congestive) heart failure: Secondary | ICD-10-CM

## 2021-04-19 DIAGNOSIS — E119 Type 2 diabetes mellitus without complications: Secondary | ICD-10-CM | POA: Diagnosis not present

## 2021-04-19 DIAGNOSIS — I10 Essential (primary) hypertension: Secondary | ICD-10-CM | POA: Diagnosis not present

## 2021-04-19 MED ORDER — FUROSEMIDE 20 MG PO TABS: 20.0000 mg | ORAL_TABLET | Freq: Every day | ORAL | 0 refills | Status: DC | PRN

## 2021-04-19 NOTE — Addendum Note (Signed)
Addended by: Elpidio Galea T on: 04/19/2021 09:45 AM   Modules accepted: Orders

## 2021-04-19 NOTE — Telephone Encounter (Signed)
Patient needs a refill on his furosemide (LASIX) 20 MG tablet

## 2021-04-19 NOTE — Progress Notes (Signed)
Patient called/ pre- screened for virtual appoinment today with oncologist. Concerns of controlling blood sugar

## 2021-04-19 NOTE — Progress Notes (Signed)
Hematology/Oncology Consult note [facility] Telephone:([phone] [phone]   Patient Care Team: Burnard Hawthorne, FNP as PCP - General (Family Medicine) Minna Merritts, MD as Consulting Physician (Cardiology)  REFERRING PROVIDER: Burnard Hawthorne, FNP  CHIEF COMPLAINTS/REASON FOR VISIT:  Evaluation of abnormal CBC  I connected with Todd Clay on 04/19/21 at  9:05 AM EST by telephone visit and verified that I am speaking with the correct person using two identifiers.   I discussed the limitations, risks, security and privacy concerns of performing an evaluation and management service by telemedicine and the availability of in-person appointments. I also discussed with the patient that there may be a patient responsible charge related to this service. The patient expressed understanding and agreed to proceed.   Other persons participating in the visit and their role in the encounter: NP, Patient and wife   Patient's location: Home  Provider's location: Clinic     HISTORY OF PRESENTING ILLNESS:  Todd Clay is a  75 y.o.  male with PMH listed below was seen in consultation at the request of  Burnard Hawthorne, FNP  for evaluation of abnormal CBC  Patient recently had blood work done on 08/18/2020, CBC showed increased eosinophil percentage at 6%-[normal reference 0-5%]. He has no complaints today.  With further questioning, he endorses some chronic hand joint pain, morning stiffness.  No unintentional weight loss, fever, night sweats. Also reports chronic sinus congestion, he does not think the symptoms are worse during spring. He was started on losartan 25 mg in November 2021, restarted on Lasix in December 2021.  He reports that helps his breathing symptoms.   INTERVAL HISTORY Todd Clay is a 75 year old male with above history who presents virtually to discuss most recent lab work.  Reports overall doing well.  Having trouble  with his blood sugars being slightly elevated.  Is working with his PCP on this.  Denies any new concerns.  Review of Systems  Constitutional: Negative.  Negative for appetite change, chills, fatigue and fever.  HENT:  Negative.  Negative for hearing loss, lump/mass, mouth sores and nosebleeds.   Eyes: Negative.  Negative for eye problems.  Respiratory:  Negative for cough, hemoptysis and shortness of breath.   Cardiovascular: Negative.  Negative for chest pain and leg swelling.  Gastrointestinal: Negative.  Negative for abdominal pain, blood in stool, constipation, diarrhea, nausea and vomiting.  Endocrine: Negative.  Negative for hot flashes.  Genitourinary: Negative.  Negative for bladder incontinence, difficulty urinating, dysuria, frequency and hematuria.   Musculoskeletal: Negative.  Negative for back pain, flank pain, gait problem and myalgias.  Skin: Negative.  Negative for itching and rash.  Neurological: Negative.  Negative for dizziness, gait problem, headaches, light-headedness and numbness.  Hematological: Negative.  Negative for adenopathy.  Psychiatric/Behavioral:  Negative for confusion. The patient is not nervous/anxious.    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   CORONARY ARTERY BYPASS GRAFT  1998   x1 vessel @  [facility]   RETINAL DETACHMENT SURGERY     Laser   STENTS     x 5    VITREOUS RETINAL SURGERY  1985   Laser tx OU    SOCIAL HISTORY: Social History   Socioeconomic History   Marital status: Married    Spouse name: Heritage manager   Number of children: 2   Years of education: 12   Highest education level: Not on file  Occupational History   Occupation: Retired  Tobacco Use   Smoking status: Never   Smokeless tobacco: Never  Vaping Use   Vaping Use: Never used  Substance and Sexual Activity   Alcohol use: No   Drug use: No   Sexual activity: Not on file  Other Topics Concern   Not on file  Social History Narrative   Lives at home with wife.   Caffeine use: daily   Social Determinants of Health   Financial Resource Strain: Low Risk    Difficulty of Paying Living Expenses: Not hard at all  Food Insecurity: No Food Insecurity   Worried About Charity fundraiser in the Last Year: Never true   Arboriculturist in the Last Year: Never true  Transportation Needs: No Transportation Needs   Lack of Transportation (Medical): No   Lack of Transportation (Non-Medical): No  Physical Activity: Unknown   Days of Exercise per Week: 0 days   Minutes of Exercise per Session: Not on file  Stress: No Stress Concern Present   Feeling of Stress : Not at all  Social Connections: Unknown   Frequency of Communication with Friends and Family: Not on file   Frequency of Social Gatherings with Friends and Family: Not on file   Attends Religious Services: Not on Electrical engineer or Organizations: Not on file   Attends Archivist Meetings: Not on file   Marital Status: Married  Human resources officer Violence: Not At Risk   Fear of Current or Ex-Partner: No   Emotionally Abused: No   Physically Abused: No   Sexually Abused: No    FAMILY HISTORY: Family History  Problem Relation Age of Onset   Kidney disease Mother        Kidney failure   Heart disease Mother    Heart disease  Father        CAD   Cancer Brother        Head & Neck Tumor   Alcohol abuse Brother    Diabetes Maternal Aunt    Diabetes Maternal Uncle    Diabetes Maternal Grandmother    Cancer Maternal Grandmother    Diabetes Maternal Grandfather     ALLERGIES:  has No Known Allergies.  MEDICATIONS:  Current Outpatient Medications  Medication Sig Dispense Refill   aspirin EC 81 MG tablet Take 81 mg by mouth daily. Swallow whole.     atorvastatin (LIPITOR) 80 MG tablet Take 1 tablet by mouth daily.     atorvastatin (LIPITOR) 80 MG tablet Take 80 mg by mouth daily.     carvedilol (COREG) 6.25 MG tablet Take 6.25 mg by mouth 2 (two) times daily.     clopidogrel (PLAVIX)  75 MG tablet Take 1 tablet by mouth once daily 90 tablet 2   Continuous Blood Gluc Receiver (FREESTYLE LIBRE 14 DAY READER) DEVI USE TO TEST BLOOD SUGAR     Continuous Blood Gluc Sensor (FREESTYLE LIBRE 14 DAY SENSOR) MISC USE 1 SENSOR EVERY 14 DAYS     furosemide (LASIX) 20 MG tablet TAKE 1 TABLET BY MOUTH ONCE DAILY AS NEEDED 90 tablet 0   gabapentin (NEURONTIN) 300 MG capsule TAKE THREE CAPSULES BY MOUTH THREE TIMES DAILY 270 capsule 1   gabapentin (NEURONTIN) 800 MG tablet Take 1 tablet by mouth 3 (three) times daily.     insulin regular (NOVOLIN R RELION) 100 units/mL injection Inject 0.06 mLs (6 Units total) into the skin 3 (three) times daily before meals. 10 mL 2   sertraline (ZOLOFT) 50 MG tablet Take 1 tablet (50 mg total) by mouth at bedtime. 90 tablet 3   TRESIBA FLEXTOUCH 100 UNIT/ML SOPN FlexTouch Pen Inject 14 Units into the skin daily.   3   No current facility-administered medications for this visit.     PHYSICAL EXAMINATION: ECOG PERFORMANCE STATUS: 1 - Symptomatic but completely ambulatory There were no vitals filed for this visit.  There were no vitals filed for this visit.   Physical Exam Neurological:     Mental Status: He is alert and oriented to person, place, and time.    LABORATORY DATA:  I  have reviewed the data as listed Lab Results  Component Value Date   WBC 6.9 04/05/2021   HGB 12.0 (L) 04/05/2021   HCT 35.5 (L) 04/05/2021   MCV 91.7 04/05/2021   PLT 199 04/05/2021   Recent Labs    09/21/20 1528 11/23/20 1803 04/05/21 1417  NA 137 138 135  K 4.0 4.0 3.9  CL 102 104 102  CO2 24 27 25   GLUCOSE 200* 126* 135*  BUN 19 27* 24*  CREATININE 1.16 1.44* 1.12  CALCIUM 8.4* 8.7* 8.2*  GFRNONAA >60 51* >60  PROT 6.5  --  6.3*  ALBUMIN 3.6  --  3.5  AST 23  --  28  ALT 15  --  21  ALKPHOS 75  --  90  BILITOT 0.6  --  0.5    Iron/TIBC/Ferritin/ %Sat    Component Value Date/Time   IRON 72 04/30/2020 0818   FERRITIN 90.9 04/30/2020 0818   IRONPCTSAT 27.2 04/30/2020 0818      RADIOGRAPHIC STUDIES: I have personally reviewed the radiological images as listed and agreed with the findings in the report. No results found.    ASSESSMENT & PLAN:  No diagnosis found.  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  Labs today show white count of 6.9, eosinophil 6 and absolute eosinophil count 0.4. Labs reviewed and discussed with patient. Rest of labs are essentially unremarkable. He denies any new concerning symptoms.  Anemia- Hemoglobin 12 (13.6).  No bleeding. Never had a colonoscopy.  No new medications.  Eats a good variety in his diet.  Will start a multivitamin daily. Check anemia labs in 6 months-iron panel, ferritin, CBC, CMP, folate and B12 level  Arthralgia-  Continues to have left shoulder pain and bilateral hand arthralgia. Followed by PCP.   Disposition- RTC in 6 months for labs (CBC, CMP, Ferritin, iron panel, B12 and folate) and to see Dr. Tasia Catchings.    All questions were answered. The patient knows to call the clinic with any problems questions or concerns.  cc Burnard Hawthorne, FNP   04/19/2021  Todd Clay  Todd Mcdermott, NP 04/19/2021 9:23 AM

## 2021-04-26 DIAGNOSIS — E1042 Type 1 diabetes mellitus with diabetic polyneuropathy: Secondary | ICD-10-CM | POA: Diagnosis not present

## 2021-04-26 NOTE — Telephone Encounter (Signed)
Just FYI patient's wife called and stated that patient is still retaining fluid after taking the 20mg  of Lasix daily. She stated that patient says that he isn't SOB, but feels he needs to go outside to get fresh air to breathe. She stated that he also did not eat much at Thanksgiving & has not had any excessive salt. All labs early in the month were stable as well. She said that patient has spell like this right after stating the Zoloft last year & does not know if this could just be anxiety related. I advised that due to patient feeling he has to go outside to cath his breath that he needed to be seen by UC or ED to be evaluated. Wife stated that she knew that patient would not go. I called patient to try to encourage him to go. He stated that he was not SOB & did not feel the need to go at this time. He just is unsure as to why he is retaining fluid & isn't consuming excessive salt, plus is taking Lasix. He wanted to know if increase in milligram was needed. I scheduled patient 9:30 Wednesday with you & stated that could be discussed then unless you feel otherwise. I asked patient that if worsening swelling, SOB did occur that he please go to ED to be seen asap.

## 2021-04-26 NOTE — Telephone Encounter (Signed)
Patient's wife called stating patient is still retaining fluid . Does he need to increase his lasix?

## 2021-04-28 ENCOUNTER — Telehealth: Payer: Self-pay | Admitting: Oncology

## 2021-04-28 ENCOUNTER — Other Ambulatory Visit: Payer: Self-pay

## 2021-04-28 ENCOUNTER — Ambulatory Visit: Payer: Medicare PPO | Admitting: Family

## 2021-04-28 VITALS — BP 122/82 | HR 65 | Temp 98.2°F | Ht 68.0 in | Wt 201.4 lb

## 2021-04-28 DIAGNOSIS — I152 Hypertension secondary to endocrine disorders: Secondary | ICD-10-CM | POA: Diagnosis not present

## 2021-04-28 DIAGNOSIS — E1159 Type 2 diabetes mellitus with other circulatory complications: Secondary | ICD-10-CM | POA: Diagnosis not present

## 2021-04-28 DIAGNOSIS — R630 Anorexia: Secondary | ICD-10-CM | POA: Diagnosis not present

## 2021-04-28 DIAGNOSIS — I5031 Acute diastolic (congestive) heart failure: Secondary | ICD-10-CM

## 2021-04-28 DIAGNOSIS — I25118 Atherosclerotic heart disease of native coronary artery with other forms of angina pectoris: Secondary | ICD-10-CM | POA: Diagnosis not present

## 2021-04-28 DIAGNOSIS — I10 Essential (primary) hypertension: Secondary | ICD-10-CM

## 2021-04-28 LAB — COMPREHENSIVE METABOLIC PANEL
ALT: 29 U/L (ref 0–53)
AST: 31 U/L (ref 0–37)
Albumin: 3.6 g/dL (ref 3.5–5.2)
Alkaline Phosphatase: 120 U/L — ABNORMAL HIGH (ref 39–117)
BUN: 19 mg/dL (ref 6–23)
CO2: 30 mEq/L (ref 19–32)
Calcium: 8.8 mg/dL (ref 8.4–10.5)
Chloride: 101 mEq/L (ref 96–112)
Creatinine, Ser: 1.07 mg/dL (ref 0.40–1.50)
GFR: 68.1 mL/min (ref >60.00)
Glucose, Bld: 243 mg/dL — ABNORMAL HIGH (ref 70–99)
Potassium: 4.6 mEq/L (ref 3.5–5.1)
Sodium: 138 mEq/L (ref 135–145)
Total Bilirubin: 0.7 mg/dL (ref 0.2–1.2)
Total Protein: 5.8 g/dL — ABNORMAL LOW (ref 6.0–8.3)

## 2021-04-28 LAB — PSA: PSA: 0.82 ng/mL (ref 0.10–4.00)

## 2021-04-28 MED ORDER — LOSARTAN POTASSIUM 25 MG PO TABS: 12.5000 mg | ORAL_TABLET | Freq: Every day | ORAL | 1 refills | Status: DC

## 2021-04-28 NOTE — Patient Instructions (Addendum)
As you do not have your labs done with hematology, we will schedule this for you.  It is very important to complete the work-up for elevated eosinophils.   Please continue to follow with pulmonology and call them periodically to to get update on receiving your cipap machine.  For the next 2 days, take lasix 40mg  once daily and then resume lasix 20mg .   Bring blood pressure machine from home to follow up. Call me with escalations of blood pressure > 130/80.   Limit salt and monitor your weight at home.   I have ordered CT chest and CT abdomen for poor appetite and general malaise.  Echocardiogram  Let us know if you dont hear back within a week in regards to an appointment being scheduled.

## 2021-04-28 NOTE — Assessment & Plan Note (Addendum)
New. Constellation of symptoms today including appetite changes, apathy,general malaise. Advised malignancy evaluation including CT a/p and Chest. He continues to declines colonoscopy and I advised that in particular with vague symptoms, screening for malignancy and staying up to date is enormously important . He understands that we are not currently screening for colon cancer with images above. I question where untreated OSA contributory to daytime fatigue, certainly labile blood pressure, depression, deconditioning are also playing a role. He doesn't appear acutely volume overloaded today, pending BNP and advised to increase lasix 20mg  to 40mg  QD for 2 days. He has upcoming appointment with endocrine and neurology. Will follow closely.

## 2021-04-28 NOTE — Telephone Encounter (Signed)
MD office called to have lab work set up for pt. Please give wife a call at [phone]

## 2021-04-28 NOTE — Progress Notes (Signed)
I have called cancer center & LM for them to call patient to schedule future labs ordered.

## 2021-04-28 NOTE — Assessment & Plan Note (Addendum)
Weight stable.  No shortness of breath.  Pitting bilateral edema on exam improved overall.  Advised to increase Lasix 40 mg for 2 days and then resume Lasix 20 mg daily as managed by cardiology.  Pending BNP

## 2021-04-28 NOTE — Assessment & Plan Note (Signed)
Chronic, stable.  will update lipid panel at follow-up when he is fasting.  Continue aspirin , plavix.

## 2021-04-28 NOTE — Telephone Encounter (Signed)
Which MD office called? Did they leave a number?

## 2021-04-28 NOTE — Progress Notes (Signed)
Subjective:    Patient ID: Todd Clay, male    DOB: [birth year], 75 y.o.   MRN: [id number]  CC: Todd Clay is a 75 y.o. male who presents today for an acute visit.    HPI: Accompanied by wife Complains of bilateral leg swelling  which had worsened 3 days ago, since improved.  Denies salt indiscretion Wife reports during thanksgiving he didn't care for eating and kept going outside to breath.  He did not dissipate in conversation and seemed more withdrawn.  This is unlike him.  She was concerned that he was SOB. Patient denies feeling SOB.  His appetite has been decreased for one week.  Patient denies feeling SOB, CP, constipation, abdominal pain, fever, night sweats.   No weight loss.   Patient reports in the past 4 months, he is just generally felt "unwell".  He sleeps all the time. He doesn't enjoy activities.  He doesn't think he is depressed.   Wife is interested increasing  lasix dose from 20mg  to 40mg . He reports dry weight at 196-200lb.   No orthopnea, cough.   He continues to have brief 5 minutes episodes describing that he is 'floating around'. Will occur when sitting. No recurrent syncopal episodes since last seen at ENT office 2 months ago/       Declines colonoscopy   MRI brain 11/23/20 which showed no acute intracranial finding.  Age-related atrophy.  Mild chronic small vessel changes.  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   HTN- He never started losartan 12.5mg  ,per wife. She is unsure. Patient however feels he is taking. He is compliant with coreg 6.25mg  BID. BP at home are labile from 170/91 and then comes down to 137/72.   CAD- compliant with aspirin , plavix. Follow up with Dr Midge Minium   HLD-compliant with atorvastatin 80mg   OSA- he has yet to receive cipap and waiting on this from pulmonology.    Follow up with endocrinology 05/01/2019 diabetes.  He also is an appointment with Dr Jaynee Eagles 05/04/21  He did not have follow-up  labs with hematology for follow-up eosinophilia.   Echo 01/2021 LVEF 50-55%, aortic sclerosis.   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   CORONARY ARTERY BYPASS GRAFT  1998   x1 vessel @ [facility]   RETINAL DETACHMENT SURGERY     Laser   STENTS     x 5    VITREOUS RETINAL SURGERY  1985   Laser tx [facility]   Family History  Problem Relation Age of Onset   Kidney disease Mother        Kidney failure   Heart disease Mother    Heart disease Father  CAD   Cancer Brother        Head & Neck Tumor   Alcohol abuse Brother    Diabetes Maternal Aunt    Diabetes Maternal Uncle    Diabetes Maternal Grandmother    Cancer Maternal Grandmother    Diabetes Maternal Grandfather     Allergies: Patient has no known allergies. Current Outpatient Medications on File Prior to Visit  Medication Sig Dispense Refill   aspirin EC 81 MG tablet Take 81 mg by mouth daily. Swallow whole.     atorvastatin (LIPITOR) 80 MG tablet Take 80 mg by mouth daily.     carvedilol (COREG) 6.25 MG tablet Take 6.25 mg by mouth 2 (two) times daily.     clopidogrel (PLAVIX) 75 MG tablet Take 1 tablet by mouth once daily 90 tablet 2   Continuous Blood Gluc Receiver (FREESTYLE LIBRE 14 DAY READER) DEVI USE TO TEST BLOOD SUGAR     furosemide (LASIX) 20 MG tablet Take 1 tablet (20 mg total) by  mouth daily as needed. 90 tablet 0   gabapentin (NEURONTIN) 300 MG capsule TAKE THREE CAPSULES BY MOUTH THREE TIMES DAILY 270 capsule 1   gabapentin (NEURONTIN) 800 MG tablet Take 1 tablet by mouth 3 (three) times daily.     insulin regular (NOVOLIN R RELION) 100 units/mL injection Inject 0.06 mLs (6 Units total) into the skin 3 (three) times daily before meals. 10 mL 2   sertraline (ZOLOFT) 50 MG tablet Take 1 tablet (50 mg total) by mouth at bedtime. 90 tablet 3   TRESIBA FLEXTOUCH 100 UNIT/ML SOPN FlexTouch Pen Inject 14 Units into the skin daily.   3   No current facility-administered medications on file prior to visit.    Social History   Tobacco Use   Smoking status: Never   Smokeless tobacco: Never  Vaping Use   Vaping Use: Never used  Substance Use Topics   Alcohol use: No   Drug use: No    Review of Systems  Constitutional:  Positive for appetite change. Negative for activity change, chills, fever and unexpected weight change.  HENT:  Negative for congestion.   Eyes:  Negative for visual disturbance.  Respiratory:  Negative for cough, shortness of breath and wheezing.   Cardiovascular:  Positive for leg swelling. Negative for chest pain and palpitations.  Gastrointestinal:  Negative for abdominal pain, diarrhea, nausea and vomiting.  Musculoskeletal:  Negative for myalgias.  Skin:  Negative for rash.  Neurological:  Positive for dizziness and light-headedness. Negative for syncope (no recurrent episode) and headaches.  Hematological:  Negative for adenopathy.  Psychiatric/Behavioral:  Positive for sleep disturbance (sleeping all the time). Negative for confusion.      Objective:    BP 122/82   Pulse 65   Temp 98.2 F (36.8 C) (Oral)   Ht 5\' 8"  (1.727 m)   Wt 201 lb 6.4 oz (91.4 kg)   SpO2 95%   BMI 30.62 kg/m   Wt Readings from Last 3 Encounters:  04/28/21 201 lb 6.4 oz (91.4 kg)  03/12/21 200 lb 3.2 oz (90.8 kg)  01/22/21 198 lb 3.2 oz (89.9 kg)     Physical Exam Vitals reviewed.  Constitutional:      Appearance: Normal appearance. He is well-developed.  Cardiovascular:     Rate and Rhythm: Regular rhythm.     Heart sounds: Normal heart sounds.     Comments: BLE +1 pitting edema. No palpable cords or masses. No erythema or increased warmth. No  asymmetry in calf size when compared bilaterally  No discoloration or varicosities noted. LE warm and palpable pedal pulses.  Pulmonary:     Effort: Pulmonary effort is normal. No respiratory distress.     Breath sounds: Normal breath sounds. No wheezing, rhonchi or rales.  Abdominal:     General: Bowel sounds are normal. There is no distension.     Palpations: Abdomen is soft. Abdomen is not rigid. There is no fluid wave or mass.     Tenderness: There is no abdominal tenderness. There is no guarding or rebound. Negative signs include Murphy's sign and McBurney's sign.  Musculoskeletal:     Right lower leg: 1+ Pitting Edema present.     Left lower leg: 1+ Pitting Edema present.  Skin:    General: Skin is warm and dry.  Neurological:     Mental Status: He is alert.  Psychiatric:        Speech: Speech normal.        Behavior: Behavior normal.       Assessment & Plan:   Problem List Items Addressed This Visit       Cardiovascular and Mediastinum   Acute congestive heart failure ([place])    Weight stable.  No shortness of breath.  Pitting bilateral edema on exam improved overall.  Advised to increase Lasix 40 mg for 2 days and then resume Lasix 20 mg daily as managed by cardiology.  Pending BNP      Relevant Medications   losartan (COZAAR) 25 MG tablet   CAD (coronary artery disease)    Chronic, stable.  will update lipid panel at follow-up when he is fasting.  Continue aspirin , plavix.       Relevant Medications   losartan (COZAAR) 25 MG tablet   Hypertension associated with diabetes ([place])   Relevant Medications   losartan (COZAAR) 25 MG tablet   Other Relevant Orders    Comprehensive metabolic panel   Primary hypertension    Well-controlled today.  Patient does have periods of feeling 'floating' , brief, self limiting.  In the setting of his labile blood pressures as recorded at home, I am very hesitant to decrease medication at this time.  I am also hesitant to increase losartan to prior 25 mg dose due to history of hypotension.  First advised to bring blood pressure cuff to the office at follow-up to ensure that it is accurate and counseled patient and wife on the appropriate way to take blood pressure to ensure I.e resting 15-20 mins prior to taking blood pressure readings.Patient will also confirm that he is taking losartan 12.5 mg as we discussed today.  Continue losartan 12.5 mg, carvedilol 6.25 mg twice daily.  Again, suspect untreated sleep apnea ( awaiting on Cipap) is contributory to labile blood pressures.      Relevant Medications   losartan (COZAAR) 25 MG tablet     Other   Poor appetite - Primary    New. Constellation of symptoms today including appetite changes, apathy,general malaise. Advised malignancy evaluation including CT a/p and Chest. He continues to declines colonoscopy and I advised that in particular with vague symptoms, screening for malignancy and staying up to date is enormously important . He understands that we are not currently screening for colon cancer with images above. I question where untreated OSA contributory to daytime fatigue, certainly labile blood pressure, depression, deconditioning are also playing a role. He doesn't appear acutely volume overloaded today, pending BNP and advised to increase lasix 20mg   to 40mg  QD for 2 days. He has upcoming appointment with endocrine and neurology. Will follow closely.       Relevant Orders   Comprehensive metabolic panel   Brain natriuretic peptide   CT Chest Wo Contrast   CT ABDOMEN PELVIS W CONTRAST   PSA      I am having Todd Clay start on losartan. I am also having  him maintain his insulin regular, gabapentin, Tresiba FlexTouch, FreeStyle Libre 14 Day Reader, aspirin EC, sertraline, clopidogrel, carvedilol, atorvastatin, gabapentin, and furosemide.   Meds ordered this encounter  Medications   losartan (COZAAR) 25 MG tablet    Sig: Take 0.5 tablets (12.5 mg total) by mouth daily.    Dispense:  90 tablet    Refill:  1    Order Specific Question:   Supervising Provider    Answer:   Crecencio Mc [[id number]]     Return precautions given.   Risks, benefits, and alternatives of the medications and treatment plan prescribed today were discussed, and patient expressed understanding.   Education regarding symptom management and diagnosis given to patient on AVS.  Continue to follow with Burnard Hawthorne, FNP for routine health maintenance.   Patience Musca and I agreed with plan.   Mable Paris, FNP  I have spent 40 minutes with a patient including precharting, exam, reviewing medical records, and discussion plan of care.

## 2021-04-28 NOTE — Assessment & Plan Note (Addendum)
Well-controlled today.  Patient does have periods of feeling 'floating' , brief, self limiting.  In the setting of his labile blood pressures as recorded at home, I am very hesitant to decrease medication at this time.  I am also hesitant to increase losartan to prior 25 mg dose due to history of hypotension.  First advised to bring blood pressure cuff to the office at follow-up to ensure that it is accurate and counseled patient and wife on the appropriate way to take blood pressure to ensure I.e resting 15-20 mins prior to taking blood pressure readings.Patient will also confirm that he is taking losartan 12.5 mg as we discussed today.  Continue losartan 12.5 mg, carvedilol 6.25 mg twice daily.  Again, suspect untreated sleep apnea ( awaiting on Cipap) is contributory to labile blood pressures.

## 2021-05-04 ENCOUNTER — Encounter: Payer: Self-pay | Admitting: Neurology

## 2021-05-04 ENCOUNTER — Other Ambulatory Visit: Payer: Self-pay

## 2021-05-04 ENCOUNTER — Telehealth: Payer: Self-pay | Admitting: Internal Medicine

## 2021-05-04 ENCOUNTER — Telehealth: Payer: Self-pay | Admitting: Neurology

## 2021-05-04 ENCOUNTER — Ambulatory Visit: Payer: Medicare PPO | Admitting: Neurology

## 2021-05-04 VITALS — Ht 68.0 in | Wt 200.0 lb

## 2021-05-04 DIAGNOSIS — R269 Unspecified abnormalities of gait and mobility: Secondary | ICD-10-CM | POA: Diagnosis not present

## 2021-05-04 DIAGNOSIS — M4802 Spinal stenosis, cervical region: Secondary | ICD-10-CM

## 2021-05-04 DIAGNOSIS — I951 Orthostatic hypotension: Secondary | ICD-10-CM | POA: Diagnosis not present

## 2021-05-04 DIAGNOSIS — G969 Disorder of central nervous system, unspecified: Secondary | ICD-10-CM | POA: Diagnosis not present

## 2021-05-04 DIAGNOSIS — R55 Syncope and collapse: Secondary | ICD-10-CM

## 2021-05-04 DIAGNOSIS — G20C Parkinsonism, unspecified: Secondary | ICD-10-CM

## 2021-05-04 DIAGNOSIS — R251 Tremor, unspecified: Secondary | ICD-10-CM

## 2021-05-04 DIAGNOSIS — W19XXXA Unspecified fall, initial encounter: Secondary | ICD-10-CM

## 2021-05-04 DIAGNOSIS — G20 Parkinson's disease: Secondary | ICD-10-CM | POA: Diagnosis not present

## 2021-05-04 DIAGNOSIS — R270 Ataxia, unspecified: Secondary | ICD-10-CM

## 2021-05-04 DIAGNOSIS — E1042 Type 1 diabetes mellitus with diabetic polyneuropathy: Secondary | ICD-10-CM

## 2021-05-04 NOTE — Telephone Encounter (Signed)
Please let patient know I forgot to give him some information on DAT scans, I can send it to him over my chart.  He is on Zoloft/sertraline which can interfere with the test and he will have to stop it 4 days prior to having the DaTscan completed.  Other than that I do not see any other medications he has to stop.  Thanks

## 2021-05-04 NOTE — Telephone Encounter (Signed)
Opened in error

## 2021-05-04 NOTE — Telephone Encounter (Signed)
Dr Jaynee Eagles requested FU 8 weeks for 1 hour can you make this appt  for this patient and fu with wife. Thank you

## 2021-05-04 NOTE — Patient Instructions (Addendum)
EEG - schedule that here in the office MRI cervical spine: [facility] DAT Scan for parkinson's disease F/u 8 weeks  Parkinson's Disease Parkinson's disease is a movement disorder. It is a long-term condition that gets worse over time. Each person with Parkinson's disease is affected differently. This condition limits a person's ability to control movements and move the body normally. The condition can range from mild to severe. Parkinson's disease tends to get worse slowly over several years. What are the causes? Parkinson's disease is caused by a loss of brain cells (neurons) that make a brain chemical called dopamine. Dopamine is needed to control movement. As the condition gets worse, more neurons that make dopamine die. This makes it hard to move or control your movements. The exact cause of the loss of neurons is not known. Genes and the environment may contribute to the cause of Parkinson's disease. What increases the risk? The following factors may make you more likely to develop this condition: Being male. Being age 75 or older. Having a family history of Parkinson's disease. Having had a traumatic brain injury. Having been exposed to toxins, such as pesticides. Having depression. What are the signs or symptoms? Symptoms of this condition can vary. The main symptoms are related to movement. These include: A tremor or shaking while you are resting. You cannot control the shaking. Stiffness in your arms and legs (rigidity). Slowing of movement. You may lose facial expressions and have trouble making small movements that are needed to button clothing or brush your teeth. An abnormal walk. You may walk with short, shuffling steps. Loss of balance and stability when standing. You may sway, fall backward, and have trouble making turns. Other symptoms include: Mental or cognitive changes, including: Depression or anxiety. Having false beliefs (delusions). Seeing, hearing, or  feeling things that do not exist (hallucinations). Trouble speaking or swallowing. Changes in bowel or bladder functions, including constipation, having to go urgently or frequently, or not being able to control your bowel or bladder. Changes in sleep habits, acting out dreams, or trouble sleeping. Depending on the severity of the symptoms, Parkinson's disease may be mild, moderate, or advanced. Parkinson's disease progression is different for everyone. Some people may not progress to the advanced stage. Mild Parkinson's disease involves: Movement problems that do not affect daily activities. Movement problems on one side of the body. Moderate Parkinson's disease involves: Movement problems on both sides of the body. Slowing of movement. Coordination and balance problems. Advanced Parkinson's disease involves: Extreme difficulty walking. Inability to live alone safely. Signs of dementia, such as having trouble remembering things, doing daily tasks such as getting dressed, and problem solving. How is this diagnosed? This condition is diagnosed by a specialist. A diagnosis may be made based on symptoms, your medical history, and a physical exam. You may also have brain imaging tests to check for loss of neurons in the brain. How is this treated? There is no cure for Parkinson's disease. Treatment focuses on managing your symptoms. Treatment may include: Medicines. Everyone responds to medicines differently. Your response may change over time. Work with your health care provider to find the best medicines for you. Speech, occupational, and physical therapy. Deep brain stimulation surgery to reduce tremors and other involuntary movements. Follow these instructions at home: Medicines Take over-the-counter and prescription medicines only as told by your health care provider. Avoid taking medicines that can affect thinking, such as pain or sleeping medicines. Eating and drinking Follow  instructions from your health care  provider about eating or drinking restrictions. Do not drink alcohol. Activity Ask your health care provider if it is safe for you to drive. Do exercises as told by your health care provider or physical therapist. Lifestyle  Install grab bars and railings in your home to prevent falls. Do not use any products that contain nicotine or tobacco. These products include cigarettes, chewing tobacco, and vaping devices, such as e-cigarettes. If you need help quitting, ask your health care provider. Consider joining a support group for people with Parkinson's disease. General instructions Work with your health care provider to know the kind of day-to-day help that you may need and what to do to stay safe. Keep all follow-up visits. This is important. Follow-up visits include any visits with a physical therapist, speech therapist, or occupational therapist. Where to find more information [employer] of Neurological Disorders and Stroke: [employer] [place]: www.parkinson.org Contact a health care provider if: Medicines do not help your symptoms. You are unsteady or have fallen at home. You need more support to function well at home. You have trouble swallowing. You have severe constipation. You are having problems with side effects from your medicines. You feel confused, anxious, depressed, or have hallucinations. Get help right away if you: Are injured after a fall. Cannot swallow without choking. Have chest pain or trouble breathing. Do not feel safe at home. Have thoughts about hurting yourself or others. These symptoms may represent a serious problem that is an emergency. Do not wait to see if the symptoms will go away. Get medical help right away. Call your local emergency services (911 in the [place] Do not drive yourself to the hospital. If you ever feel like you may hurt yourself or others, or have thoughts about taking your  own life, get help right away. Go to your nearest emergency department or: Call your local emergency services (911 in the [place] Call a suicide crisis helpline, such as the [place] at 469-445-9772 or 988 in the [employer]. This is open 24 hours a day in the [place] Text the Crisis Text Line at 763 502 5111 (in the [place].). Summary Parkinson's disease is a long-term condition that gets worse over time. This condition limits your ability to control your movements and move your body normally. There is no cure for Parkinson's disease. Treatment focuses on managing your symptoms. Work with your health care provider to know the kind of day-to-day help that you may need and what to do to stay safe. Keep all follow-up visits, including any visits with a physical therapist, speech therapist, or occupational therapist. This is important. This information is not intended to replace advice given to you by your health care provider. Make sure you discuss any questions you have with your health care provider. Document Revised: 12/09/2020 Document Reviewed: 08/31/2020 Elsevier Patient Education  2022 [place] How to prepare and what to expect What is a brain DaTscan? A brain DaTscan is a nuclear medicine scan. It uses radioactive material to diagnose some diseases of the brain, especially those that cause tremor (shakiness). DaTscan is a brand name for a drug called ioflupane I-123. A brain DaTscan is a form of radiology, because radiation is used to take pictures of the body. This radioactive drug is ordered especially for you. Because of this, we need at least 72 hours' notice if you must cancel or reschedule your scan.   How does the scan work? You will be given a small dose of  tracer (radioactive material) through an intravenous (IV) line. This tracer will collect in part of your brain and give off gamma rays. A special camera called a gamma camera will use these rays to  produce pictures and measurements of your brain. How do I prepare? Some drugs will affect the results of your brain DaTscan. You will need to stop taking these drugs before your scan. The table on page 2 lists the drugs that need to be stopped, and for how many days before your scan. This list is in alphabetical order by the generic name of the drug. The common brand names are listed beneath the generic name. Please confirm these instructions with your doctor who prescribed the drug. Drugs to Stop Taking Before your scan, stop taking these medicines for the length of time shown: Name of Drug Stop Taking  Amoxapine 4 days before  Benztropine  Cogentin 3 days before  Bupropion (Aplenzin, Budeprion, Voxra, Wellbutrin, Zyban) 48 hours before  Buspirone 15 hours before  Citalopram 24 hours before  Cocaine 6 hours before  Escitalopram 24 hours before  Methamphetamine 24 hours before  Methylphenidate (Concerta, Metadate, Methylin, Ritalin) 20 hours before  Paroxetine 24 hours before  Selegilene 48 hours before  Sertraline 3 days before  If you are breastfeeding, or if there is any chance you are pregnant, please tell the scheduler or technologist (the person who will help you prepare for your scan). How is the scan done? When you first arrive, we will ask you to drink a small cup of water with potassium iodine in it. This water may have a metallic taste.  An hour after you drink the potassium iodine water, the technologist will inject a small amount of tracer into a vein in your arm or hand through your IV.  You must stay in the department for 30 minutes after the injection.  You will then have a break for 3 hours. It is OK to eat and drink during this break.  You must return to the clinic after this 3-hour break to have images of your brain taken.  Then, 4 hours after you receive your tracer injection, the technologist will take images of your brain with the gamma camera. You will lie flat on  the exam table while these images are being taken.  You must not move while the camera is taking pictures. If you move, the pictures will be blurry and may have to be taken again.  Taking the images will take 40 to 45 minutes. Your total time in the imaging room will be about 1 hour.  You may also have a low-dose CT scan of your brain to help confirm any results. A CT scan is another way to take images inside your body.  It will take about 5 hours from the time you drink the potassium iodine water until the scans are complete. What will I feel during the scan? The technologist will help make you as comfortable as possible on the exam table for the scan.  You may feel some minor discomfort from the IV.  Lying still on the exam table may be hard for some patients.  The camera will be close to your head. This may make you feel confined or uneasy (claustrophobic). Please tell the doctor who referred you for this scan if you know you are claustrophobic. Are there any side effects from the scan? Most of the radioactivity from the tracer will pass out of your body in your urine or stool.  The rest simply goes away over time.  Bad reactions to this scan are very rare. Fewer than 1% of patients (fewer than 1 out of 100) have a bad reaction. Reactions may include headache, nausea, vertigo (dizziness), or dry mouth. How do I get the results? When the test is over, the nuclear medicine doctor will review your images, prepare a written report, and talk with your doctor about the results. Your doctor will then talk with you about the results and your treatment options. If you needed to stop taking any medicines on the day of your scan, ask your doctor when to start taking them again.  The potentially interfering drugs consist of: amoxapine, amphetamine, benztropine, bupropion, buspirone, citalopram, cocaine, mazindol, methamphetamine, methylphenidate, norephedrine, phentermine, escitalopram,  phenylpropanolamine,  selegiline, paroxetine, and sertraline

## 2021-05-04 NOTE — Progress Notes (Addendum)
[facility]    Provider:  Dr Jaynee Eagles Requesting Provider: Burnard Hawthorne, FNP Primary Care Provider:  Burnard Hawthorne, FNP  CC:  dizziness/lightheaded  (Addendum 05/23/2021: DAT Scan is NOT consistent with Parkinson's disease, does not explain his gait abnormality. 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 No reduced radiotracer activity in basal ganglia to suggest Parkinson's syndrome pathology.Of note, DaTSCAN is not diagnostic of Parkinsonian syndromes, which remains a clinical diagnosis. DaTscan is an adjuvant test to aid in the clinical diagnosis of Parkinsonian syndromes.)  HPI:  Todd Clay is a 75 y.o. male here as requested by Burnard Hawthorne, FNP for syncope. PMHx coronary artery disease, depression, diabetes and diabetic neuropathy, hyperlipidemia, hypertension, myocardial infarction, osteomyelitis toe, proliferative diabetic retinopathy, peripheral vascular disease, retinal detachment,, lightheadedness and syncopal episodes, anxiety.  His wife is here today with him and provides much information; patient reports lightheadedness when get up and start walking 90%. Sitting makes it better. He has passed out, he had been to the ENT.  He had a syncopal episode, he felt lightheaded, he felt terrible, felt it was coming, eyes closed, no loss of bowel or bladder, no abnormal movements, started 4-5 months ago when it started and having labile blood pressures. He has seen cardiology at [facility].  He has developed a tremor in the right greater than left hand with rest, but he also has a tremor of the head and lips and legs, wife feels voice has become softer, hasn't had smell for years, no vivid dreams, no wet pillows or sheets on the ground, he has fallen he fell in [place] and he missed a step and fell. Has not fallen due to  imbalance, he has difficulty walking and balance, he has neck pain, handwriting is messy and bigger difficulty writing name.No delusions or hallucinations. No constipation. He is shuffling. ED.depression and anxiety and memory disturbance as well. No other focal neurologic deficits, associated symptoms, inciting events or modifiable factors.   Reviewed notes, labs and imaging from outside physicians, which showed:  MRI brain 11/23/2020: IMPRESSION: No acute intracranial finding. Age related atrophy. Mild chronic small-vessel change of the cerebral hemispheric white matter.   CMP showed elevated glucose, BUN 19, creatinine 1.07, elevated alk phos April 28, 2021.  He has multiple labs pending including vitamin B12 ordered by Faythe Casa, TSH was normal in July 2022  Review of Systems: Patient complains of symptoms per HPI as well as the following symptoms falls,anxiety. Pertinent negatives and positives per HPI. All others negative.   Social History   Socioeconomic History   Marital status: Married    Spouse name: Heritage manager   Number of children: 2   Years of education: 12   Highest education level: Not on file  Occupational History   Occupation: Retired  Tobacco Use   Smoking status: Never   Smokeless tobacco: Never  Vaping Use   Vaping Use: Never used  Substance and Sexual Activity   Alcohol use: No   Drug use: No   Sexual activity: Not on file  Other Topics Concern   Not on file  Social History Narrative   Lives at home with wife.   Caffeine use: daily   Social Determinants of Health   Financial Resource Strain: Low Risk    Difficulty of Paying Living Expenses: Not hard at all  Food Insecurity: No Food Insecurity  Worried About Charity fundraiser in the Last Year: Never true   [place] in the Last Year: Never true  Transportation Needs: No Transportation Needs   Lack of Transportation (Medical): No   Lack of Transportation (Non-Medical): No  Physical  Activity: Unknown   Days of Exercise per Week: 0 days   Minutes of Exercise per Session: Not on file  Stress: No Stress Concern Present   Feeling of Stress : Not at all  Social Connections: Unknown   Frequency of Communication with Friends and Family: Not on file   Frequency of Social Gatherings with Friends and Family: Not on file   Attends Religious Services: Not on file   Active Member of Clubs or Organizations: Not on file   Attends Archivist Meetings: Not on file   Marital Status: Married  Human resources officer Violence: Not At Risk   Fear of Current or Ex-Partner: No   Emotionally Abused: No   Physically Abused: No   Sexually Abused: No    Family History  Problem Relation Age of Onset   Kidney disease Mother        Kidney failure   Heart disease Mother    Heart disease Father        CAD   Cancer Brother        Head & Neck Tumor   Alcohol abuse Brother    Diabetes Maternal Aunt    Diabetes Maternal Uncle    Diabetes Maternal Grandmother    Cancer Maternal Grandmother    Diabetes Maternal Grandfather    Syncope episode Neg Hx     Past Medical History:  Diagnosis Date   CAD (coronary artery disease)    Depression    Diabetes mellitus with complication ([place])    Diabetic neuropathy ([place])    History of chickenpox    HLD (hyperlipidemia)    HTN (hypertension)    MI (myocardial infarction) ([place])    x3   Osteomyelitis ([place])    Left 2nd and 3rd metatarsal   Proliferative diabetic retinopathy ([place])    OU   PVD (peripheral vascular disease) ([place])    Retinal detachment    OD    Patient Active Problem List   Diagnosis Date Noted   Poor appetite 04/28/2021   Syncopal episodes 03/12/2021   Coronary artery disease involving native coronary artery of native heart with angina pectoris ([place]) 11/26/2020   Type 2 diabetes mellitus, without long-term current use of insulin ([place]) 11/26/2020   Primary hypertension 11/26/2020   Eosinophil count raised 10/16/2020    Anxiety 05/06/2020   Anemia 04/15/2020   OSA (obstructive sleep apnea) 03/09/2020   Erectile dysfunction due to arterial insufficiency 01/07/2019   Myopia with astigmatism and presbyopia, bilateral 11/08/2017   Right retinal detachment 11/08/2017   Stable proliferative diabetic retinopathy of both eyes associated with type 1 diabetes mellitus ([place]) 11/08/2017   Hyperlipidemia due to type 1 diabetes mellitus ([place]) 03/06/2017   Hypertension associated with diabetes ([place]) 03/06/2017   Acute congestive heart failure ([place]) 10/11/2016   Dysplastic nevi 07/30/2015   Weakness 01/28/2015   Jerking movements of extremities 01/28/2015   Malar rash 11/03/2014   Diabetic polyneuropathy ([place]) 03/20/2014   Toe amputation status 03/20/2014   History of retinal detachment 03/20/2014   History of osteomyelitis 11/18/2013   Hx of CABG 08/26/2013   Hyperlipidemia 08/26/2013   Orthostatic hypotension 08/26/2013   Diabetes type 1, uncontrolled 08/13/2013   CAD (coronary artery disease) 08/13/2013  Diabetes mellitus with complication ([place]) 85/07/7739   Screen for colon cancer 08/13/2013   Fatigue 08/13/2013   Epiretinal membrane, both eyes 09/19/2012   Pseudophakia of both eyes 09/19/2012   Proliferative diabetic retinopathy, both eyes ([place]) 09/19/2012    Past Surgical History:  Procedure Laterality Date   AMPUTATION OF REPLICATED TOES     Left 2nd and 3rd metatarsal   [facility]     Patient has a total of 5 stents placed.   CORONARY ARTERY BYPASS GRAFT  1998   x1 vessel @ [facility]   RETINAL DETACHMENT SURGERY     Laser   STENTS     x 5    VITREOUS RETINAL SURGERY  1985   Laser tx OU    Current Outpatient Medications  Medication Sig Dispense Refill   aspirin EC 81 MG tablet Take 81 mg by mouth daily. Swallow whole.     atorvastatin (LIPITOR) 80 MG tablet Take 80 mg by mouth daily.     clopidogrel (PLAVIX) 75 MG tablet Take 1 tablet by mouth once daily  90 tablet 2   Continuous Blood Gluc Receiver (FREESTYLE LIBRE 14 DAY READER) DEVI USE TO TEST BLOOD SUGAR     furosemide (LASIX) 20 MG tablet Take 1 tablet (20 mg total) by mouth daily as needed. 90 tablet 0   gabapentin (NEURONTIN) 300 MG capsule TAKE THREE CAPSULES BY MOUTH THREE TIMES DAILY 270 capsule 1   gabapentin (NEURONTIN) 800 MG tablet Take 1 tablet by mouth 3 (three) times daily.     insulin regular (NOVOLIN R RELION) 100 units/mL injection Inject 0.06 mLs (6 Units total) into the skin 3 (three) times daily before meals. 10 mL 2   losartan (COZAAR) 25 MG tablet Take 0.5 tablets (12.5 mg total) by mouth daily. 90 tablet 1   sertraline (ZOLOFT) 50 MG tablet Take 1 tablet (50 mg total) by mouth at bedtime. 90 tablet 3   TRESIBA FLEXTOUCH 100 UNIT/ML SOPN FlexTouch Pen Inject 14 Units into the skin daily.   3   carvedilol (COREG) 6.25 MG tablet Take 6.25 mg by mouth 2 (two) times daily.     No current facility-administered medications for this visit.    Allergies as of 05/04/2021   (No Known Allergies)    Vitals: Ht 5' 8"  (1.727 m)   Wt 200 lb (90.7 kg)   BMI 30.41 kg/m  Last Weight:  Wt Readings from Last 1 Encounters:  05/04/21 200 lb (90.7 kg)   Last Height:   Ht Readings from Last 1 Encounters:  05/04/21 5' 8"  (1.727 m)     Physical exam: Exam: Gen: NAD, conversant, well nourised, obese, well groomed                     CV: RRR, no MRG. No Carotid Bruits. No peripheral edema, warm, nontender Eyes: Conjunctivae clear without exudates or hemorrhage  Neuro: Detailed Neurologic Exam  Speech:    Speech is normal; fluent with normal comprehension.  Cognition:  MMSE - Mini Mental State Exam 05/04/2021  Orientation to time 4  Orientation to Place 4  Registration 3  Attention/ Calculation 5  Recall 3  Language- name 2 objects 2  Language- repeat 1  Language- follow 3 step command 3  Language- read & follow direction 1  Write a sentence 1  Copy design 0   Total score 27       The patient is oriented to  person, place, and time;     recent and remote memory intact;     language fluent;     normal attention, concentration,     fund of knowledge Cranial Nerves: masked facies    Pupils are pinpoint but reactive to light, pupils too small to visualize fundi attempted. 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 Extraocular movements are intact. Trigeminal sensation is intact and the muscles of mastication are normal. The face is symmetric. The palate elevates in the midline. Hearing intact. Voice is normal. Shoulder shrug is normal. The tongue has normal motion without fasciculations.   Coordination:    bradykinesia  Gait: cannot stand without using his hands. Low clearance, stooped posture, reemergent mild tremor, imbalance. Slightly wide based but has severe neuropathy due to long history of diabetes  Motor Observation:   Abnormal movements of hands at rest, coarse, rubing thumb against first finger tremor right>left Tone:    Increased right arm tone     Strength:    Strength is V/V in the upper and lower limbs.      Sensation: decreased glove and stocking, severe neuropathy     Reflex Exam:  DTR's: Ajs absent, trace patellars, 1+ left biceps and trace right     Toes:    The toes are downgoing bilaterally.   Clonus:    Clonus is absent.    Assessment/Plan:   75 y.o. male here as requested by Burnard Hawthorne, FNP for syncope. PMHx coronary artery disease, depression, diabetes and diabetic neuropathy, hyperlipidemia, hypertension, myocardial infarction, osteomyelitis toe, proliferative diabetic retinopathy, peripheral vascular disease, retinal detachment,, lightheadedness and syncopal episodes, anxiety.Patient has a resting tremor, slightly shuffling gait, stooped posture, increased tone upper right extremity, masked facies, I suspect he has Parkinson's disease.  However he also has shaking of his head and  lips which is unusual and more essential tremor even though I suspect he has Parkinson's disease we will still order a DaTscan to differentiate between the two disorders, stop zoloft 4 days prior.  (Addendum 05/23/2021: DAT Scan is NOT consistent with Parkinson's disease. IMPRESSION:Normal Ioflupane scan. No reduced radiotracer activity in basal ganglia to suggest Parkinson's syndrome pathology.Of note, DaTSCAN is not diagnostic of Parkinsonian syndromes, which remains a clinical diagnosis. DaTscan is an adjuvant test to aid in the clinical diagnosis of Parkinsonian syndromes.)  (Addendum 05/25/2021: The MRI cervical spine shows some moderate spinal canal stenosis. I would like him to be evaluated nu neurosurgery to see if this has anything to do with his gait abnormality seeing DAT was negative for PD)  Orthostatic hypotension 143/62 p72 laying, 136/55 p58 sitting, 126/62 p 72 standing. Recommend pcp or cardiology decrease/alter his blood pressure medications  Imbalance and falls: Refuses PT. Refuses to use a walking aid.  Lightheadedness and syncope: Orthostatic hypotension may autonomic if he has Parkinson's disease but will check a routine EEG in the office and he is on multiple blood pressure medications recommend management by pcp.  Ataxia: We will order MRI cervical spine to ensure no other reasons for his gait abnormality   Orders Placed This Encounter  Procedures   NM BRAIN DATSCAN TUMOR LOC INFLAM SPECT 1 DAY   MR CERVICAL SPINE WO CONTRAST   EEG adult     Cc: Arnett, Yvetta Coder, FNP,  [place], Yvetta Coder, [place], [facility] [address] [place], [place] [address]  Phone [phone] Fax [phone]  I spent over 60  minutes of face-to-face and non-face-to-face time with patient on the  1. Orthostatic hypotension   2. Syncope, unspecified syncope type   3. Diabetic polyneuropathy associated with type 1 diabetes mellitus  ([place])   4. 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 Parkinsonism, unspecified Parkinsonism type ([place])   6. Gait abnormality   7. Fall, initial encounter   8. Ataxia    diagnosis.  This included previsit chart review, lab review, study review, order entry, electronic health record documentation, patient education on the different diagnostic and therapeutic options, counseling and coordination of care, risks and benefits of management, compliance, or risk factor reduction

## 2021-05-05 ENCOUNTER — Other Ambulatory Visit: Payer: Self-pay | Admitting: Family

## 2021-05-05 DIAGNOSIS — R899 Unspecified abnormal finding in specimens from other organs, systems and tissues: Secondary | ICD-10-CM

## 2021-05-05 NOTE — Telephone Encounter (Addendum)
MRI has been approved by [facility]. [place] #[id number] (05/05/21- 06/04/21). Order for MRI has been sent to GI.  I called [employer] [phone]) and spoke with Judye Bos. She states DaTscan (CPT [phone]) does not require authorization. Reference #[id number]. DaTscan order has been sent to [facility]. They will call patient to schedule. Phone: [phone].

## 2021-05-06 ENCOUNTER — Telehealth: Payer: Self-pay

## 2021-05-06 ENCOUNTER — Encounter: Payer: Self-pay | Admitting: Neurology

## 2021-05-06 NOTE — Telephone Encounter (Signed)
Padonda cancelled patient's CT of abdomen on 12/6 reason being NO SHOW? Rasheedah called me & the patient was never scheduled. Do you know anything about this? She is scheduled patient for CT of chest.

## 2021-05-06 NOTE — Telephone Encounter (Signed)
I called and spoke to wife of pt who was with pt when seen Dr Jaynee Eagles.  I relayed that Dr. Jaynee Eagles will send [facility] information on mychart for them to read about this procedure.  Also relayed to her about the sertraline medication that may need to be stopped 4 days prior to test, but other medications were ok.  She appreciated call back.   I stated that order was sent to [facility] and they will be reaching out to make appt.  She verbalized understanding.

## 2021-05-10 ENCOUNTER — Other Ambulatory Visit: Payer: Self-pay

## 2021-05-10 DIAGNOSIS — I10 Essential (primary) hypertension: Secondary | ICD-10-CM

## 2021-05-10 NOTE — Telephone Encounter (Signed)
Rasheedah Do you know why ct a/p was canceled?  Sarah,  Call pt  Does he still want to pursue? I know he has a lot  going with Dr Jaynee Eagles ( neurology)

## 2021-05-11 ENCOUNTER — Ambulatory Visit: Payer: Medicare PPO | Admitting: Neurology

## 2021-05-11 ENCOUNTER — Telehealth: Payer: Self-pay | Admitting: *Deleted

## 2021-05-11 DIAGNOSIS — R55 Syncope and collapse: Secondary | ICD-10-CM | POA: Diagnosis not present

## 2021-05-11 NOTE — Telephone Encounter (Signed)
Which labs are to be drawn? Lab notes states CMP, no order found for CMP but there are other orders.  Please advise

## 2021-05-12 NOTE — Procedures (Signed)
° ° °  History:  75 year old man with syncope and lightheadedness   EEG classification: Awake and drowsy  Description of the recording: The background rhythms of this recording consists of a fairly well modulated medium amplitude alpha rhythm of 6-7 Hz that is reactive to eye opening and closure. As the record progresses, the patient appears to remain in the waking state throughout the recording. Photic stimulation was performed, did not show any abnormalities. Hyperventilation was also performed, did not show any abnormalities. Toward the end of the recording, the patient enters the drowsy state with slight symmetric slowing seen. The patient never enters stage II sleep. No abnormal epileptiform discharges seen during this recording. There was no focal slowing. EKG monitor shows a irregular heart rate 42.   Impression: This is an abnormal EEG recording in the waking and drowsy state due to diffuse slowing. Diffuse slowing is consistent with a  generalized brain dysfunction such as encephalopathy.    Alric Ran, MD [facility]

## 2021-05-12 NOTE — Telephone Encounter (Signed)
Thanks for your attention to detail here toya ° °No cmp °He needs alk phos fractionated and GGT which is ordered ° ° °

## 2021-05-14 ENCOUNTER — Other Ambulatory Visit (INDEPENDENT_AMBULATORY_CARE_PROVIDER_SITE_OTHER): Payer: Medicare PPO

## 2021-05-14 ENCOUNTER — Other Ambulatory Visit: Payer: Self-pay

## 2021-05-14 DIAGNOSIS — R899 Unspecified abnormal finding in specimens from other organs, systems and tissues: Secondary | ICD-10-CM | POA: Diagnosis not present

## 2021-05-14 NOTE — Addendum Note (Signed)
Addended by: Leeanne Rio on: 05/14/2021 02:25 PM   Modules accepted: Orders

## 2021-05-17 LAB — ALKALINE PHOSPHATASE, ISOENZYMES
Alkaline Phosphatase: 130 IU/L — ABNORMAL HIGH (ref 44–121)
BONE FRACTION: 18 % (ref 12–68)
INTESTINAL FRAC.: 19 % — ABNORMAL HIGH (ref 0–18)
LIVER FRACTION: 63 % (ref 13–88)

## 2021-05-17 LAB — GAMMA GT: GGT: 11 IU/L (ref 0–65)

## 2021-05-17 NOTE — Telephone Encounter (Signed)
-----   Message from Melvenia Beam, MD sent at 05/13/2021 11:15 AM EST ----- Dr. Vidal Schwalbe, Patient's EEG was slowed but we did see an irregular rate of 42, he may need a cardiac evaluation. I'll leave this to you.  Pod 4: please inform patient. I sent a result note but did not include the heart rate information. He may need a cardiac evaluation per Dr. Harlan Stains decision thanks  ----- Message ----- From: Alric Ran, MD Sent: 05/12/2021   5:56 PM EST To: Melvenia Beam, MD  His heart rate was irregular with a rate of 42, I think he might have some type of block, otherwise his EEG just shows diffuse slowing.   Thanks  [employer]

## 2021-05-17 NOTE — Telephone Encounter (Signed)
I called spoke to wife of pt (Todd Clay) and relayed that the EEG showed no seizures, but slowing (could be from drowsiness/memory loss) still in work up phase.  Has MRI tomorrow of neck and then DAT Scan Thursday. Will call with results of this as well. Will need appt to go over test results.  Wife has been giving him basic results.  I relayed that Dr. Jaynee Eagles wanted to let them know as well that EEG showed irregular HR at 42.  (Normal is 60-100) will lower when asleep.  But has had labs per Dr. Vidal Schwalbe since we have seen.  I relayed that we did let pcp know this as well.  She appreciated call.  I tried answering her questions as best I could.  We will see them back for f/u when all tests back.  She verbalized understanding.

## 2021-05-18 ENCOUNTER — Ambulatory Visit
Admission: RE | Admit: 2021-05-18 | Discharge: 2021-05-18 | Disposition: A | Discharge status: Home / Self Care | Payer: Medicare PPO | Source: Ambulatory Visit | Attending: Neurology | Admitting: Neurology

## 2021-05-18 ENCOUNTER — Other Ambulatory Visit: Payer: Self-pay

## 2021-05-18 ENCOUNTER — Telehealth: Payer: Self-pay | Admitting: Family

## 2021-05-18 ENCOUNTER — Other Ambulatory Visit: Payer: Self-pay | Admitting: Family

## 2021-05-18 DIAGNOSIS — G969 Disorder of central nervous system, unspecified: Secondary | ICD-10-CM | POA: Diagnosis not present

## 2021-05-18 DIAGNOSIS — R270 Ataxia, unspecified: Secondary | ICD-10-CM

## 2021-05-18 DIAGNOSIS — R251 Tremor, unspecified: Secondary | ICD-10-CM

## 2021-05-18 DIAGNOSIS — R269 Unspecified abnormalities of gait and mobility: Secondary | ICD-10-CM

## 2021-05-18 DIAGNOSIS — R7401 Elevation of levels of liver transaminase levels: Secondary | ICD-10-CM

## 2021-05-18 DIAGNOSIS — R630 Anorexia: Secondary | ICD-10-CM

## 2021-05-18 DIAGNOSIS — W19XXXA Unspecified fall, initial encounter: Secondary | ICD-10-CM

## 2021-05-18 DIAGNOSIS — G20 Parkinson's disease: Secondary | ICD-10-CM

## 2021-05-18 DIAGNOSIS — R531 Weakness: Secondary | ICD-10-CM | POA: Diagnosis not present

## 2021-05-18 NOTE — Telephone Encounter (Signed)
When I spoke with patient's wife she did not want this reordered at this time since he was having neurology work up. She feels they have too much going on at this time.

## 2021-05-18 NOTE — Telephone Encounter (Signed)
Sorry, didn't realize she had canceled  Okay, Todd Clay, please disregard ct a/p order and we can cancel

## 2021-05-18 NOTE — Telephone Encounter (Signed)
FYI I spoke with Silva Bandy from Dr. Lenard Forth office & he is booked until February. He is only in the clinic on Friday's. She was going to secure chat him but he was in a procedure. She did sent me nurse triage line where I explained situation with abnormal HR dropping to 42. I asked that they reach out to patient or to me & left info.  When I called to speak with patient & wife they were already aware of findings seen on EEG. She said that her daughter, Eustaquio Maize actually works some in [place] with Dr. Michiel Cowboy & she was going to make him aware of patient's findings. I advised that definitely be in the look out for a call from his office to possibly being scheduled with him or if appropriate a NP in that office.

## 2021-05-18 NOTE — Telephone Encounter (Signed)
Rasheedah,   I have reordered CT a/p as not sure why canceled. We can sch when he has ct chest 06/04/21.   Let me know when scheduled   [name],  Please call pt/wife and let them know that we are working on ct abdomen.  How is appetite?  Does he have follow up scheduled with dr Jaynee Eagles, neurology?

## 2021-05-18 NOTE — Telephone Encounter (Signed)
Please call  triage at [facility] cardiology below  Pt had EEG with neurology as undergoing evaluation for parkinson's. Dr Jaynee Eagles , neurology, informed that EEG reported irregular heart rate and dropped to 42.   Can they see  him promptly? Please call pt/ wife and let him know we are informed of low heart rate and are calling cardiology.   He is compliant with coreg 6.25mg  BID with excellent control of BP the past couple of months. He continues to complain of dizziness.   BP Readings from Last 3 Encounters:  04/28/21 122/82  03/12/21 120/68  01/22/21 108/80     Bethanne Ginger, MD   [facility]   [place],  [place] [address]   [phone]

## 2021-05-19 ENCOUNTER — Telehealth: Payer: Self-pay | Admitting: Family

## 2021-05-19 DIAGNOSIS — I152 Hypertension secondary to endocrine disorders: Secondary | ICD-10-CM

## 2021-05-19 NOTE — Telephone Encounter (Signed)
Pt will need CMP, I see a order, Can the order be changed to STAT labs to be done the day of his CT appt on 06/11/2021 at [facility]? Please advise and Thank you!

## 2021-05-20 ENCOUNTER — Encounter (HOSPITAL_COMMUNITY)
Admission: RE | Admit: 2021-05-20 | Discharge: 2021-05-20 | Disposition: A | Discharge status: Home / Self Care | Payer: Medicare PPO | Source: Ambulatory Visit | Attending: Neurology | Admitting: Neurology

## 2021-05-20 ENCOUNTER — Other Ambulatory Visit: Payer: Self-pay

## 2021-05-20 DIAGNOSIS — R269 Unspecified abnormalities of gait and mobility: Secondary | ICD-10-CM

## 2021-05-20 DIAGNOSIS — G969 Disorder of central nervous system, unspecified: Secondary | ICD-10-CM | POA: Diagnosis not present

## 2021-05-20 DIAGNOSIS — R251 Tremor, unspecified: Secondary | ICD-10-CM | POA: Diagnosis present

## 2021-05-20 DIAGNOSIS — W19XXXA Unspecified fall, initial encounter: Secondary | ICD-10-CM

## 2021-05-20 DIAGNOSIS — R270 Ataxia, unspecified: Secondary | ICD-10-CM | POA: Diagnosis not present

## 2021-05-20 DIAGNOSIS — R413 Other amnesia: Secondary | ICD-10-CM | POA: Diagnosis not present

## 2021-05-20 DIAGNOSIS — G20 Parkinson's disease: Secondary | ICD-10-CM | POA: Diagnosis not present

## 2021-05-20 DIAGNOSIS — G20C Parkinsonism, unspecified: Secondary | ICD-10-CM

## 2021-05-20 DIAGNOSIS — R296 Repeated falls: Secondary | ICD-10-CM | POA: Diagnosis not present

## 2021-05-20 MED ORDER — IOFLUPANE I 123 185 MBQ/2.5ML IV SOLN
4.8500 | Freq: Once | INTRAVENOUS | Status: AC | PRN
  Administered 2021-05-20: 11:00:00 4.85 via INTRAVENOUS

## 2021-05-20 MED ORDER — POTASSIUM IODIDE (ANTIDOTE) 130 MG PO TABS
ORAL_TABLET | ORAL | Status: AC
  Administered 2021-05-20: 10:00:00 130 mg via ORAL
  Filled 2021-05-20: qty 1

## 2021-05-20 MED ORDER — POTASSIUM IODIDE (ANTIDOTE) 130 MG PO TABS: 130.0000 mg | ORAL_TABLET | Freq: Once | ORAL | Status: AC

## 2021-05-21 DIAGNOSIS — R079 Chest pain, unspecified: Secondary | ICD-10-CM | POA: Diagnosis not present

## 2021-05-21 DIAGNOSIS — R001 Bradycardia, unspecified: Secondary | ICD-10-CM | POA: Diagnosis not present

## 2021-05-21 DIAGNOSIS — R9431 Abnormal electrocardiogram [ECG] [EKG]: Secondary | ICD-10-CM | POA: Diagnosis not present

## 2021-05-21 DIAGNOSIS — I25119 Atherosclerotic heart disease of native coronary artery with unspecified angina pectoris: Secondary | ICD-10-CM | POA: Diagnosis not present

## 2021-05-21 DIAGNOSIS — E1169 Type 2 diabetes mellitus with other specified complication: Secondary | ICD-10-CM | POA: Diagnosis not present

## 2021-05-21 DIAGNOSIS — R55 Syncope and collapse: Secondary | ICD-10-CM | POA: Diagnosis not present

## 2021-05-21 DIAGNOSIS — I10 Essential (primary) hypertension: Secondary | ICD-10-CM | POA: Diagnosis not present

## 2021-05-21 DIAGNOSIS — I440 Atrioventricular block, first degree: Secondary | ICD-10-CM | POA: Diagnosis not present

## 2021-05-21 NOTE — Telephone Encounter (Signed)
noted 

## 2021-05-25 ENCOUNTER — Telehealth: Payer: Self-pay | Admitting: *Deleted

## 2021-05-25 NOTE — Telephone Encounter (Signed)
-----   Message from Melvenia Beam, MD sent at 05/25/2021  1:17 PM EST ----- The MRI cervical spine shows some moderate spinal canal stenosis. I would like him to be evaluated nu neurosurgery to ensure this is not contributing to his gait abnormality. If ok with him, I can refer let me know thanks. Dr. Jaynee Eagles

## 2021-05-25 NOTE — Telephone Encounter (Signed)
Called pt spoke to Wife (Checked DPR) Gave her results of MRI and Dr.Ahern Recommendation for Neurosurgery. Wife wants to discuss with Patient and daughter and will give me call back .Wife thanked me for Calling

## 2021-05-26 DIAGNOSIS — R079 Chest pain, unspecified: Secondary | ICD-10-CM | POA: Diagnosis not present

## 2021-05-26 NOTE — Addendum Note (Signed)
Addended by: Sarina Ill B on: 05/26/2021 05:28 PM   Modules accepted: Orders

## 2021-05-27 NOTE — Telephone Encounter (Signed)
FYI pt was called and he accepted the 60 min appointment for 06-28-21 with a 2:00 arrival time.  No call back requested

## 2021-05-27 NOTE — Telephone Encounter (Signed)
Great, thanks

## 2021-05-28 NOTE — Addendum Note (Signed)
Addended by: Burnard Hawthorne on: 05/28/2021 08:48 AM   Modules accepted: Orders

## 2021-05-28 NOTE — Telephone Encounter (Signed)
Fyi Griselda Miner Call pt  I ordered stat CMP to be done morning at 8am ahead of CT chest at 1pm that day  He needs to go to [facility] to have this done so we can have result prior to his test

## 2021-06-01 ENCOUNTER — Ambulatory Visit: Payer: PPO | Admitting: Neurology

## 2021-06-01 NOTE — Telephone Encounter (Signed)
[place] called in and stated that the CT that is schedule on 06/04/21  the authorization is expired and another authorization is needed. Please call Melissa @ [phone] ext [phone]. Patient has [facility]

## 2021-06-02 ENCOUNTER — Telehealth: Payer: Self-pay | Admitting: Family

## 2021-06-02 NOTE — Telephone Encounter (Signed)
Thank you :)

## 2021-06-02 NOTE — Telephone Encounter (Signed)
I called and spoke with patient's wife & she was inquiring what was safe for diabetics to take for cough.I was looked up options for patient & it appears several options that are sugar free. Also the Robitussin has an option that included the plain Mucinex for congestion.   Joycelyn Schmid have included you just bc I am concerned. Pt's wife was inquiring about appointments & dates bc patient has so many to keep up with. She was inquiring about he lab needed before CT on 1/6. Then I received a message from a Melissa that stated authorization has expired. I don't think that lab is needed bc CT does not have contrast? Also not sure if authorization will or can get approved by then? Rasheedah please advise?

## 2021-06-02 NOTE — Telephone Encounter (Signed)
That's okay! Who would be able to tell me if he needs the lab before his CT? Margaret didn't think that he needed since CT was without contrast.

## 2021-06-02 NOTE — Telephone Encounter (Signed)
Awesome thank you! Just making sure he needs lab that morning since CT does not have contrast? That is the last piece of the puzzle & I can call patient's wife back.

## 2021-06-02 NOTE — Telephone Encounter (Signed)
Pts wife called in regards to pt not feeling well. Wife states pt has been dealing with a cold since Christmas. Pt is scheduled for VV with [facility] 1/5 at 8:40. Pts wife would like a call back regarding some questions she has about caring for pt.

## 2021-06-03 ENCOUNTER — Encounter: Payer: Self-pay | Admitting: Nurse Practitioner

## 2021-06-03 ENCOUNTER — Other Ambulatory Visit: Payer: Self-pay

## 2021-06-03 ENCOUNTER — Telehealth (INDEPENDENT_AMBULATORY_CARE_PROVIDER_SITE_OTHER): Payer: Medicare PPO | Admitting: Nurse Practitioner

## 2021-06-03 DIAGNOSIS — R051 Acute cough: Secondary | ICD-10-CM | POA: Diagnosis not present

## 2021-06-03 DIAGNOSIS — J069 Acute upper respiratory infection, unspecified: Secondary | ICD-10-CM

## 2021-06-03 MED ORDER — BENZONATATE 100 MG PO CAPS: 100.0000 mg | ORAL_CAPSULE | Freq: Three times a day (TID) | ORAL | 0 refills | Status: AC

## 2021-06-03 NOTE — Patient Instructions (Signed)
Avoid the Sudafed given the medications you are on

## 2021-06-03 NOTE — Telephone Encounter (Signed)
After speaking with Rasheedah I was able to find out that CT had been rescheduled & let patient's wife know this. She is also aware that lab still needed the morning prior to CT. She will have patient there early that morning to have drawn.

## 2021-06-03 NOTE — Progress Notes (Signed)
Patient ID: Todd Clay, male    DOB: July 01, 1945, 76 y.o.   MRN: [id number]  Virtual visit completed through Emma, a video enabled telemedicine application. Due to national recommendations of social distancing due to COVID-19, a virtual visit is felt to be most appropriate for this patient at this time. Reviewed limitations, risks, security and privacy concerns of performing a virtual visit and the availability of in person appointments. I also reviewed that there may be a patient responsible charge related to this service. The patient agreed to proceed.   Patient location: home Provider location: [place] at [facility], office Persons participating in this virtual visit: patient, provider   If any vitals were documented, they were collected by patient at home unless specified below.    There were no vitals taken for this visit.   CC: Cough Subjective:   HPI: Todd Clay is a 76 y.o. male presenting on 06/03/2021 for Cough (Worse at night, nasal congestion, sneezing. Sx started about 5 days ago. Has been taking Sudafed, OTC cough/chest congestion DM Max.)   Symptoms started approx 5 days ago No covid test at home. Pfizer vaccine x2  Has a sick contact. Granddaughter has RSV, daughter had PNA.  Taken asa and sudafed, [facility] DMX cough. Flonase.  Some improvement   Relevant past medical, surgical, family and social history reviewed and updated as indicated. Interim medical history since our last visit reviewed. Allergies and medications reviewed and updated. Outpatient Medications Prior to Visit  Medication Sig Dispense Refill   aspirin EC 81 MG tablet Take 81 mg by mouth daily. Swallow whole.     atorvastatin (LIPITOR) 80 MG tablet Take 80 mg by mouth daily.     carvedilol (COREG) 6.25 MG tablet Take 6.25 mg by mouth 2 (two) times daily.     clopidogrel (PLAVIX) 75 MG tablet Take 1 tablet by mouth once daily 90 tablet 2   Continuous Blood Gluc Receiver (FREESTYLE LIBRE  14 DAY READER) DEVI USE TO TEST BLOOD SUGAR     fluticasone (FLONASE) 50 MCG/ACT nasal spray USE 2 SPRAY(S) IN EACH NOSTRIL TWICE DAILY     furosemide (LASIX) 20 MG tablet Take 1 tablet (20 mg total) by mouth daily as needed. 90 tablet 0   gabapentin (NEURONTIN) 800 MG tablet Take 800 mg by mouth 3 (three) times daily.     insulin regular (NOVOLIN R RELION) 100 units/mL injection Inject 0.06 mLs (6 Units total) into the skin 3 (three) times daily before meals. 10 mL 2   losartan (COZAAR) 25 MG tablet Take 0.5 tablets (12.5 mg total) by mouth daily. 90 tablet 1   sertraline (ZOLOFT) 50 MG tablet Take 1 tablet (50 mg total) by mouth at bedtime. 90 tablet 3   TRESIBA FLEXTOUCH 100 UNIT/ML SOPN FlexTouch Pen Inject 14 Units into the skin daily.   3   gabapentin (NEURONTIN) 300 MG capsule TAKE THREE CAPSULES BY MOUTH THREE TIMES DAILY 270 capsule 1   gabapentin (NEURONTIN) 800 MG tablet Take 1 tablet by mouth 3 (three) times daily.     No facility-administered medications prior to visit.     Per HPI unless specifically indicated in ROS section below Review of Systems  Constitutional:  Negative for chills, fatigue and fever.  HENT:  Positive for congestion and sinus pressure. Negative for ear discharge, ear pain, sinus pain and sore throat.   Respiratory:  Positive for cough (clear). Negative for shortness of breath.   Cardiovascular:  Negative for  chest pain.  Gastrointestinal:  Negative for abdominal pain, diarrhea, nausea and vomiting.  Musculoskeletal:  Negative for arthralgias and myalgias.  Neurological:  Negative for headaches.  Objective:  There were no vitals taken for this visit.  Wt Readings from Last 3 Encounters:  05/04/21 200 lb (90.7 kg)  04/28/21 201 lb 6.4 oz (91.4 kg)  03/12/21 200 lb 3.2 oz (90.8 kg)       Physical exam: Gen: alert, NAD, not ill appearing Pulm: speaks in complete sentences without increased work of breathing Psych: normal mood, normal thought content       Results for orders placed or performed in visit on 05/14/21  Gamma GT  Result Value Ref Range   GGT 11 0 - 65 IU/L  Alkaline phosphatase, isoenzymes  Result Value Ref Range   Alkaline Phosphatase 130 (H) 44 - 121 IU/L   LIVER FRACTION 63 13 - 88 %   BONE FRACTION 18 12 - 68 %   INTESTINAL FRAC. 19 (H) 0 - 18 %   Assessment & Plan:   Problem List Items Addressed This Visit       Respiratory   Viral upper respiratory tract infection - Primary    Patient's symptoms sound like a viral illness.  No indication for antibiotic use at this point.  Did discuss patient is not improving over the next 5 days to reach back out.  He can continue over-the-counter medications as needed did advise patient to avoid Sudafed given he is on antihypertensives.        Other   Acute cough    Patient's largest complaint is cough.  Has been using over-the-counter cough medication without great relief.  We will send in some Tessalon Perles 100 mg 3 times daily as needed cough.  Continue to monitor      Relevant Medications   benzonatate (TESSALON) 100 MG capsule     Meds ordered this encounter  Medications   benzonatate (TESSALON) 100 MG capsule    Sig: Take 1 capsule (100 mg total) by mouth 3 (three) times daily for 10 days.    Dispense:  30 capsule    Refill:  0    Order Specific Question:   Supervising Provider    Answer:   TOWER, MARNE A [[id number]]   No orders of the defined types were placed in this encounter.   I discussed the assessment and treatment plan with the patient. The patient was provided an opportunity to ask questions and all were answered. The patient agreed with the plan and demonstrated an understanding of the instructions. The patient was advised to call back or seek an in-person evaluation if the symptoms worsen or if the condition fails to improve as anticipated.  Follow up plan: No follow-ups on file.  Romilda Garret, NP

## 2021-06-03 NOTE — Assessment & Plan Note (Signed)
Patient's symptoms sound like a viral illness.  No indication for antibiotic use at this point.  Did discuss patient is not improving over the next 5 days to reach back out.  He can continue over-the-counter medications as needed did advise patient to avoid Sudafed given he is on antihypertensives.

## 2021-06-03 NOTE — Assessment & Plan Note (Signed)
Patient's largest complaint is cough.  Has been using over-the-counter cough medication without great relief.  We will send in some Tessalon Perles 100 mg 3 times daily as needed cough.  Continue to monitor

## 2021-06-04 ENCOUNTER — Ambulatory Visit: Payer: Medicare PPO

## 2021-06-08 DIAGNOSIS — R55 Syncope and collapse: Secondary | ICD-10-CM | POA: Diagnosis not present

## 2021-06-08 DIAGNOSIS — R001 Bradycardia, unspecified: Secondary | ICD-10-CM | POA: Diagnosis not present

## 2021-06-09 DIAGNOSIS — M4802 Spinal stenosis, cervical region: Secondary | ICD-10-CM | POA: Diagnosis not present

## 2021-06-10 DIAGNOSIS — R55 Syncope and collapse: Secondary | ICD-10-CM | POA: Diagnosis not present

## 2021-06-10 DIAGNOSIS — R001 Bradycardia, unspecified: Secondary | ICD-10-CM | POA: Diagnosis not present

## 2021-06-11 ENCOUNTER — Other Ambulatory Visit: Payer: Medicare PPO

## 2021-06-11 DIAGNOSIS — J339 Nasal polyp, unspecified: Secondary | ICD-10-CM | POA: Diagnosis not present

## 2021-06-13 ENCOUNTER — Other Ambulatory Visit: Payer: Self-pay | Admitting: Cardiovascular Disease

## 2021-06-13 ENCOUNTER — Other Ambulatory Visit: Payer: Self-pay | Admitting: Family

## 2021-06-13 DIAGNOSIS — F419 Anxiety disorder, unspecified: Secondary | ICD-10-CM

## 2021-06-21 ENCOUNTER — Other Ambulatory Visit: Payer: Self-pay

## 2021-06-21 ENCOUNTER — Ambulatory Visit
Admission: RE | Admit: 2021-06-21 | Discharge: 2021-06-21 | Disposition: A | Discharge status: Home / Self Care | Payer: Medicare PPO | Source: Ambulatory Visit | Attending: Family | Admitting: Family

## 2021-06-21 DIAGNOSIS — R911 Solitary pulmonary nodule: Secondary | ICD-10-CM | POA: Diagnosis not present

## 2021-06-21 DIAGNOSIS — R630 Anorexia: Secondary | ICD-10-CM | POA: Insufficient documentation

## 2021-06-21 DIAGNOSIS — R634 Abnormal weight loss: Secondary | ICD-10-CM | POA: Insufficient documentation

## 2021-06-21 DIAGNOSIS — K8020 Calculus of gallbladder without cholecystitis without obstruction: Secondary | ICD-10-CM | POA: Diagnosis not present

## 2021-06-21 DIAGNOSIS — R918 Other nonspecific abnormal finding of lung field: Secondary | ICD-10-CM | POA: Insufficient documentation

## 2021-06-21 DIAGNOSIS — I700 Atherosclerosis of aorta: Secondary | ICD-10-CM | POA: Insufficient documentation

## 2021-06-22 ENCOUNTER — Other Ambulatory Visit: Payer: Medicare PPO

## 2021-06-25 ENCOUNTER — Encounter: Payer: Self-pay | Admitting: Family

## 2021-06-25 ENCOUNTER — Other Ambulatory Visit: Payer: Self-pay | Admitting: Family

## 2021-06-25 ENCOUNTER — Ambulatory Visit: Payer: Medicare PPO | Admitting: Family

## 2021-06-25 DIAGNOSIS — R918 Other nonspecific abnormal finding of lung field: Secondary | ICD-10-CM

## 2021-06-25 DIAGNOSIS — I700 Atherosclerosis of aorta: Secondary | ICD-10-CM | POA: Insufficient documentation

## 2021-06-28 ENCOUNTER — Ambulatory Visit: Payer: Medicare PPO | Admitting: Neurology

## 2021-06-30 ENCOUNTER — Other Ambulatory Visit: Payer: Self-pay

## 2021-06-30 ENCOUNTER — Other Ambulatory Visit (INDEPENDENT_AMBULATORY_CARE_PROVIDER_SITE_OTHER): Payer: Medicare PPO

## 2021-06-30 DIAGNOSIS — R899 Unspecified abnormal finding in specimens from other organs, systems and tissues: Secondary | ICD-10-CM | POA: Diagnosis not present

## 2021-06-30 LAB — BRAIN NATRIURETIC PEPTIDE: Pro B Natriuretic peptide (BNP): 55 pg/mL (ref 0.0–100.0)

## 2021-06-30 NOTE — Addendum Note (Signed)
Addended by: Leeanne Rio on: 06/30/2021 10:41 AM   Modules accepted: Orders

## 2021-07-01 NOTE — Progress Notes (Deleted)
Subjective:    Patient ID: Todd Clay, male    DOB: [birth year], 76 y.o.   MRN: [id number]  CC: Todd Clay is a 76 y.o. male who presents today for follow up.   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   CORONARY ARTERY BYPASS GRAFT  1998   x1 vessel @ [facility]   RETINAL DETACHMENT SURGERY     Laser   STENTS     x 5    VITREOUS RETINAL SURGERY  1985   Laser tx [facility]   Family History  Problem Relation Age of Onset   Kidney disease Mother        Kidney failure   Heart disease Mother    Heart disease Father        CAD   Cancer Brother        Head & Neck Tumor   Alcohol abuse Brother    Diabetes Maternal Aunt    Diabetes Maternal Uncle    Diabetes Maternal Grandmother    Cancer Maternal Grandmother    Diabetes Maternal Grandfather    Syncope episode Neg Hx     Allergies: Patient has no known allergies. Current Outpatient Medications on File Prior to Visit  Medication Sig Dispense Refill   aspirin EC 81 MG tablet Take 81 mg by mouth daily. Swallow whole.     atorvastatin (LIPITOR) 80 MG tablet Take 80 mg by mouth daily.     carvedilol  (COREG) 6.25 MG tablet Take 6.25 mg by mouth 2 (two) times daily.     clopidogrel (PLAVIX) 75 MG tablet Take 1 tablet by mouth once daily 90 tablet 0   Continuous Blood Gluc Receiver (FREESTYLE LIBRE 14 DAY READER) DEVI USE TO TEST BLOOD SUGAR     fluticasone (FLONASE) 50 MCG/ACT nasal spray USE 2 SPRAY(S) IN EACH NOSTRIL TWICE DAILY     furosemide (LASIX) 20 MG tablet Take 1 tablet (20 mg total) by mouth daily as needed. 90 tablet 0   gabapentin (NEURONTIN) 800 MG tablet Take 800 mg by mouth 3 (three) times daily.     insulin regular (NOVOLIN R RELION) 100 units/mL injection Inject 0.06 mLs (6 Units total) into the skin 3 (three) times daily before meals. 10 mL 2   losartan (COZAAR) 25 MG tablet Take 0.5 tablets (12.5 mg total) by mouth daily. 90 tablet 1   sertraline (ZOLOFT) 50 MG tablet TAKE 1 TABLET BY MOUTH AT BEDTIME 90 tablet 0   TRESIBA FLEXTOUCH 100 UNIT/ML SOPN FlexTouch Pen  Inject 14 Units into the skin daily.   3   No current facility-administered medications on file prior to visit.    Social History   Tobacco Use   Smoking status: Never   Smokeless tobacco: Never  Vaping Use   Vaping Use: Never used  Substance Use Topics   Alcohol use: No   Drug use: No    Review of Systems    Objective:    There were no vitals taken for this visit. BP Readings from Last 3 Encounters:  04/28/21 122/82  03/12/21 120/68  01/22/21 108/80   Wt Readings from Last 3 Encounters:  05/04/21 200 lb (90.7 kg)  04/28/21 201 lb 6.4 oz (91.4 kg)  03/12/21 200 lb 3.2 oz (90.8 kg)    Physical Exam     Assessment & Plan:   Problem List Items Addressed This Visit   None    I am having Todd Clay maintain his insulin regular, Tresiba FlexTouch, [employer] 14 Day Reader, aspirin EC, carvedilol, atorvastatin, furosemide, losartan, fluticasone, gabapentin, clopidogrel, and sertraline.   No orders of the defined types were placed in this encounter.   Return precautions  given.   Risks, benefits, and alternatives of the medications and treatment plan prescribed today were discussed, and patient expressed understanding.   Education regarding symptom management and diagnosis given to patient on AVS.  Continue to follow with Burnard Hawthorne, FNP for routine health maintenance.   Patience Musca and I agreed with plan.   Mable Paris, FNP

## 2021-07-07 DIAGNOSIS — E785 Hyperlipidemia, unspecified: Secondary | ICD-10-CM | POA: Diagnosis not present

## 2021-07-07 DIAGNOSIS — E1042 Type 1 diabetes mellitus with diabetic polyneuropathy: Secondary | ICD-10-CM | POA: Diagnosis not present

## 2021-07-07 DIAGNOSIS — E1159 Type 2 diabetes mellitus with other circulatory complications: Secondary | ICD-10-CM | POA: Diagnosis not present

## 2021-07-07 DIAGNOSIS — I152 Hypertension secondary to endocrine disorders: Secondary | ICD-10-CM | POA: Diagnosis not present

## 2021-07-07 DIAGNOSIS — E1069 Type 1 diabetes mellitus with other specified complication: Secondary | ICD-10-CM | POA: Diagnosis not present

## 2021-07-07 LAB — HEMOGLOBIN A1C: Hemoglobin A1C: 7.4

## 2021-07-12 ENCOUNTER — Telehealth: Payer: Self-pay | Admitting: Family

## 2021-07-12 ENCOUNTER — Other Ambulatory Visit: Payer: Self-pay

## 2021-07-12 ENCOUNTER — Ambulatory Visit: Payer: Medicare PPO | Admitting: Family

## 2021-07-12 ENCOUNTER — Encounter: Payer: Self-pay | Admitting: Family

## 2021-07-12 DIAGNOSIS — R911 Solitary pulmonary nodule: Secondary | ICD-10-CM

## 2021-07-12 DIAGNOSIS — I700 Atherosclerosis of aorta: Secondary | ICD-10-CM

## 2021-07-12 DIAGNOSIS — E1159 Type 2 diabetes mellitus with other circulatory complications: Secondary | ICD-10-CM

## 2021-07-12 DIAGNOSIS — G4733 Obstructive sleep apnea (adult) (pediatric): Secondary | ICD-10-CM | POA: Diagnosis not present

## 2021-07-12 DIAGNOSIS — I152 Hypertension secondary to endocrine disorders: Secondary | ICD-10-CM | POA: Diagnosis not present

## 2021-07-12 DIAGNOSIS — R630 Anorexia: Secondary | ICD-10-CM | POA: Diagnosis not present

## 2021-07-12 NOTE — Assessment & Plan Note (Signed)
declines pursuing Cipap machine and has opted to not further pursue.  Counseled him the risk of stroke, heart attack and continued labile blood pressures.

## 2021-07-12 NOTE — Assessment & Plan Note (Addendum)
Appetite has improved.  Patient intentionally has been eating healthier which he feels has attributed to 9lb weight loss. he declines any further abdominal imaging or work up at this time in the absence of concern.

## 2021-07-12 NOTE — Telephone Encounter (Signed)
Please call [facility] cardiology, Dr Cyndy Freeze results that I can see predominantly SR. Average HR 68.   Patient has decreased coreg 6.25mg  BID to 3.25 qd.   He would like to come off and see if dizziness improves.   Please let me know if okay? Please ensure follow up scheduled.

## 2021-07-12 NOTE — Progress Notes (Signed)
Subjective:    Patient ID: Todd Clay, male    DOB: Feb 14, 1946, 76 y.o.   MRN: [id number]  CC: Todd Clay is a 76 y.o. male who presents today for follow up.   HPI: Accompanied by wife today He denies new complaints and reports 'same old, same old' He has fallen , reports twice, in the last 2 months. He continues to describe this episodic sensation of falling. No LOC. Denies injury , specifically head injury. He is able to go down 'slowly' to avoid injury as he feels episode coming on.  Describes sensation of 'staggering'. No low back pain or weakness in legs. He feels symptom when starts to stagger 'is in my head'.    Appetite has improved. No abdominal pain or pain with eating.  He has been trying to loose weight and eating 'lighter'.   OSA- he had been waiting on Cipap machine however decided not to use or pursue therapy.    Repeat CT chest wo contrast due 08/2021   Hypertension- He remains compliant with  losartan 12.5mg   .   He has been taking half tablet once per day of carvedilol 6.25 mg over the last couple of days and wife thinks this may be helping with dizziness.   Congestive heart failure-compliant with Lasix 20 mg QOD.  He feels that leg swelling considerably improved. no leg swelling, orthopnea.    BNP normal 12 days ago   Atherosclerosis-compliant with Lipitor, aspirin 81 mg, and plavix .    CT chest significant for aortic atherosclerosis, cholelithiasis 06/21/21   Follow up with cardiology 05/21/2021 [facility] for bradycardia/syncope. Zio ordered.  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   CORONARY ARTERY BYPASS GRAFT  1998   x1 vessel @ [facility]   RETINAL DETACHMENT SURGERY     Laser   STENTS     x 5    VITREOUS RETINAL SURGERY  1985   Laser tx [facility]   Family History  Problem Relation Age of Onset   Kidney disease Mother        Kidney failure   Heart disease Mother    Heart disease Father        CAD   Cancer Brother        Head & Neck Tumor   Alcohol abuse Brother    Diabetes Maternal Aunt    Diabetes Maternal Uncle    Diabetes Maternal Grandmother    Cancer Maternal Grandmother    Diabetes Maternal Grandfather  Syncope episode Neg Hx     Allergies: Patient has no known allergies. Current Outpatient Medications on File Prior to Visit  Medication Sig Dispense Refill   aspirin EC 81 MG tablet Take 81 mg by mouth daily. Swallow whole.     atorvastatin (LIPITOR) 80 MG tablet Take 80 mg by mouth daily.     clopidogrel (PLAVIX) 75 MG tablet Take 1 tablet by mouth once daily 90 tablet 0   Continuous Blood Gluc Receiver (FREESTYLE LIBRE 14 DAY READER) DEVI USE TO TEST BLOOD SUGAR     fluticasone (FLONASE) 50 MCG/ACT nasal spray USE 2 SPRAY(S) IN EACH NOSTRIL TWICE DAILY     furosemide (LASIX) 20 MG tablet Take 1 tablet (20 mg total) by mouth daily as needed. 90 tablet 0   gabapentin (NEURONTIN) 800 MG tablet Take 800 mg by mouth 3 (three) times daily.     insulin regular (NOVOLIN R RELION) 100 units/mL injection Inject 0.06 mLs (6 Units total) into the skin 3 (three) times daily  before meals. (Patient taking differently: Inject 4-6 Units into the skin 3 (three) times daily before meals. Sliding Scale) 10 mL 2   losartan (COZAAR) 25 MG tablet Take 0.5 tablets (12.5 mg total) by mouth daily. 90 tablet 1   sertraline (ZOLOFT) 50 MG tablet TAKE 1 TABLET BY MOUTH AT BEDTIME 90 tablet 0   TRESIBA FLEXTOUCH 100 UNIT/ML SOPN FlexTouch Pen Inject 12 Units into the skin daily.  3   [DISCONTINUED] carvedilol (COREG) 6.25 MG tablet Take 6.25 mg by mouth 2 (two) times daily.     No current facility-administered medications on file prior to visit.    Social History   Tobacco Use   Smoking status: Never   Smokeless tobacco: Never  Vaping Use   Vaping Use: Never used  Substance Use Topics   Alcohol use: No   Drug use: No    Review of Systems  Constitutional:  Negative for chills and fever.  HENT:  Negative for congestion, ear pain, rhinorrhea, sinus pressure and sore throat.   Respiratory:  Negative for cough, shortness of breath and wheezing.   Cardiovascular:  Negative for chest pain and palpitations.  Gastrointestinal:  Negative for abdominal pain, diarrhea, nausea and vomiting.  Genitourinary:  Negative for dysuria.  Musculoskeletal:  Negative for myalgias.  Skin:  Negative for rash.  Neurological:  Negative for headaches.  Hematological:  Negative for adenopathy.     Objective:    BP 102/62 (BP Location: Right Arm, Patient Position: Sitting, Cuff Size: Normal)    Pulse 60    Temp 98.5 F (36.9 C) (Oral)    Ht 5\' 8"  (1.727 m)    Wt 191 lb 9.6 oz (86.9 kg)    SpO2 97%    BMI 29.13 kg/m  BP Readings from Last 3 Encounters:  07/12/21 102/62  04/28/21 122/82  03/12/21 120/68   Wt Readings from Last 3 Encounters:  07/12/21 191 lb 9.6 oz (86.9 kg)  05/04/21 200 lb (90.7 kg)  04/28/21 201 lb 6.4 oz (91.4 kg)    Physical Exam Vitals reviewed.  Constitutional:      Appearance: He is well-developed.  Cardiovascular:     Rate and Rhythm: Regular rhythm.      Heart sounds: Normal heart sounds.  Pulmonary:     Effort: Pulmonary effort is normal. No respiratory distress.     Breath sounds: Normal breath sounds. No wheezing, rhonchi or rales.  Musculoskeletal:     Right lower leg:  No edema.     Left lower leg: No edema.  Skin:    General: Skin is warm and dry.  Neurological:     Mental Status: He is alert.  Psychiatric:        Speech: Speech normal.        Behavior: Behavior normal.       Assessment & Plan:   Problem List Items Addressed This Visit       Cardiovascular and Mediastinum   Aortic atherosclerosis (HCC)    Chronic, symptomatically stable.  Continue Lipitor, aspirin 81 mg, and plavix 75mg .       Hypertension associated with diabetes ([place])    Continues to have episodic dizziness. H/o bradycardia ( seen on EEG 05/10/21) .I reviewed Zio results (06/08/21)  that I can see predominantly SR. Average HR 68.  He continues to follow with neurology which I strongly advised to continue. I think reasonable to trial decrease/trial stop coreg if symptoms were to improve. However he had been on coreg prior to symptoms so I doubt related. As patient has already weaned down medication on their own, I have a  call out to [facility] cardiology Dr Midge Minium to ensure they are aware.   Offered physical therapy evaluation for gait and balance.  He politely declines at this time.  He is currently taking coreg 3.25 qd and advised to continue, although I explained medication intended to be dosed BID , for now, until cardiology is aware. Continue losartan 12.5mg .         Respiratory   OSA (obstructive sleep apnea)    declines pursuing Cipap machine and has opted to not further pursue.  Counseled him the risk of stroke, heart attack and continued labile blood pressures.        Other   Lung nodule    Discussed CT chest nodules.  Advised patient and wife state that I consulted pulmonology, Dr. Patsey Berthold whom recommended repeat CT chest without contrast in 3  months.  I have ordered this.      Poor appetite    Appetite has improved.  Patient intentionally has been eating healthier which he feels has attributed to 9lb weight loss. he declines any further abdominal imaging or work up at this time in the absence of concern.         I have discontinued Harvy L. Sacra's carvedilol. I am also having him maintain his insulin regular, Tresiba FlexTouch, [employer] 14 Day Reader, aspirin EC, atorvastatin, furosemide, losartan, fluticasone, gabapentin, clopidogrel, and sertraline.   No orders of the defined types were placed in this encounter.   Return precautions given.   Risks, benefits, and alternatives of the medications and treatment plan prescribed today were discussed, and patient expressed understanding.   Education regarding symptom management and diagnosis given to patient on AVS.  Continue to follow with Burnard Hawthorne, FNP for routine health maintenance.   Patience Musca and I agreed with plan.   Mable Paris, FNP  I have spent 25 minutes with a patient including precharting, exam, reviewing medical records, and discussion plan of care.

## 2021-07-12 NOTE — Telephone Encounter (Signed)
I was transferred to nurse's line at Dr. Lenard Forth office & had to [facility].   [phone]

## 2021-07-12 NOTE — Assessment & Plan Note (Signed)
Chronic, symptomatically stable.  Continue Lipitor, aspirin 81 mg, and plavix 75mg .

## 2021-07-12 NOTE — Assessment & Plan Note (Signed)
See note on HTN today/

## 2021-07-12 NOTE — Assessment & Plan Note (Addendum)
Discussed CT chest nodules.  Advised patient and wife state that I consulted pulmonology, Dr. Patsey Berthold whom recommended repeat CT chest without contrast in 3 months.  I have ordered this.

## 2021-07-12 NOTE — Assessment & Plan Note (Addendum)
Continues to have episodic dizziness. H/o bradycardia ( seen on EEG 05/10/21) .I reviewed Zio results (06/08/21)  that I can see predominantly SR. Average HR 68.  Todd Clay continues to follow with neurology which I strongly advised to continue. I think reasonable to trial decrease/trial stop coreg if symptoms were to improve. However Todd Clay had been on coreg prior to symptoms so I doubt related. As patient has already weaned down medication on their own, I have a  call out to [facility] cardiology Dr Midge Minium to ensure they are aware.   Offered physical therapy evaluation for gait and balance.  Todd Clay politely declines at this time.  Todd Clay is currently taking coreg 3.25 qd and advised to continue, although I explained medication intended to be dosed BID , for now, until cardiology is aware. Continue losartan 12.5mg .

## 2021-07-13 ENCOUNTER — Ambulatory Visit: Payer: Medicare PPO | Admitting: Family

## 2021-07-15 DIAGNOSIS — E1042 Type 1 diabetes mellitus with diabetic polyneuropathy: Secondary | ICD-10-CM | POA: Diagnosis not present

## 2021-07-15 NOTE — Telephone Encounter (Signed)
Belinda from Dr. Lenard Forth office at [facility] called & she is sending message to Dr. Michiel Cowboy. She will let us know if he is okay with d/c of Coreg.

## 2021-07-21 ENCOUNTER — Telehealth: Payer: Self-pay | Admitting: Family

## 2021-07-21 NOTE — Telephone Encounter (Signed)
Spoke with patients wife and let her know that liver enzymes were elevated is the reason for appointment. She verbalized understanding.

## 2021-07-21 NOTE — Telephone Encounter (Signed)
Pt wife called in stating that Pt has an appt tomorrow with Dr. Lucilla Lame. Pt wife stated that she doesn't remember the reason why pt has to go to this appt. Pt wife is requesting callback at [phone].

## 2021-07-22 ENCOUNTER — Encounter: Payer: Self-pay | Admitting: Gastroenterology

## 2021-07-22 ENCOUNTER — Ambulatory Visit (INDEPENDENT_AMBULATORY_CARE_PROVIDER_SITE_OTHER): Payer: Medicare PPO | Admitting: Gastroenterology

## 2021-07-22 ENCOUNTER — Other Ambulatory Visit: Payer: Self-pay

## 2021-07-22 VITALS — BP 143/67 | HR 38 | Temp 97.9°F | Ht 68.0 in | Wt 192.0 lb

## 2021-07-22 DIAGNOSIS — R748 Abnormal levels of other serum enzymes: Secondary | ICD-10-CM | POA: Diagnosis not present

## 2021-07-22 NOTE — Progress Notes (Signed)
Primary Care Physician: Burnard Hawthorne, FNP  Primary Gastroenterologist:  Dr. Lucilla Lame  Chief Complaint  Patient presents with   Elevated alkaline phosphatase    HPI: Todd Clay is a 76 y.o. male here was seen me before and was requested to undergo a colonoscopy.  The patient had not undergone the colonoscopy due to his fear of his diabetes and his sugar levels going out of control.  The patient now was sent to see me because of an elevated alkaline phosphatase. The patient phosphatase was fractionated and did not show any increase in the liver fraction or the bone fraction and the patient's GGT was normal.  The patient also had only an 8 point elevation of the alkaline phosphatase above the upper limit of normal. The patient denies any abdominal pain nausea vomiting fevers or chills.  As per the patient's last visit he has had diabetes since the age of 76 years old. 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 Swallow whole.     atorvastatin (LIPITOR) 80 MG tablet Take 80 mg by mouth daily.     clopidogrel (PLAVIX) 75 MG tablet Take 1 tablet by mouth once daily 90 tablet 0   Continuous Blood Gluc Receiver (FREESTYLE LIBRE 14 DAY READER) DEVI USE TO TEST BLOOD SUGAR     fluticasone (FLONASE) 50 MCG/ACT nasal spray USE 2 SPRAY(S) IN EACH NOSTRIL TWICE DAILY     furosemide (LASIX) 20 MG tablet  Take 1 tablet (20 mg total) by mouth daily as needed. 90 tablet 0   gabapentin (NEURONTIN) 800 MG tablet Take 800 mg by mouth 3 (three) times daily.     insulin regular (NOVOLIN R RELION) 100 units/mL injection Inject 0.06 mLs (6 Units total) into the skin 3 (three) times daily before meals. (Patient taking differently: Inject 4-6 Units into the skin 3 (three) times daily before meals. Sliding Scale) 10 mL 2   losartan (COZAAR) 25 MG tablet Take 0.5 tablets (12.5 mg total) by mouth daily. 90 tablet 1   sertraline (ZOLOFT) 50 MG tablet TAKE 1 TABLET BY MOUTH AT BEDTIME 90 tablet 0   TRESIBA FLEXTOUCH 100 UNIT/ML SOPN FlexTouch Pen Inject 12 Units into the skin daily.  3   No current facility-administered medications for this visit.    Allergies as of 07/22/2021   (No Known Allergies)    ROS:  General: Negative for anorexia, weight loss, fever, chills, fatigue, weakness. ENT: Negative for hoarseness, difficulty swallowing , nasal congestion. CV: Negative for chest pain, angina, palpitations, dyspnea on exertion, peripheral edema.  Respiratory: Negative for dyspnea at rest, dyspnea on exertion, cough, sputum, wheezing.  GI: See history of present illness. GU:  Negative for dysuria, hematuria, urinary incontinence, urinary frequency, nocturnal urination.  Endo: Negative for  unusual weight change.    Physical Examination:   BP (!) 143/67    Pulse (!) 38    Temp 97.9 F (36.6 C) (Oral)    Ht 5' 8"  (1.727 m)    Wt 192 lb (87.1 kg)    BMI 29.19 kg/m   General: Well-nourished, well-developed in no acute distress.  Eyes: No icterus. Conjunctivae pink. Neuro: Alert and oriented x 3.  Grossly intact. Skin: Warm and dry, no jaundice.   Psych: Alert and cooperative, normal mood and affect.  Labs:    Imaging Studies: No results found.  Assessment and Plan:   [name] is a 76 y.o. y/o male who comes in today with an elevated alk phosphatase with a normal GGT.  Please see the  "evaluation of elevated serum alkaline phosphatase" below. As can be seen with the GGT being normal and the alk phosphatase being significantly lower than 50% of the upper limit of normal the recommendations would be to observe.  The patient has again been informed about the risks and benefits of a colonoscopy and states that he continues to not want to go through with any colon cancer screening procedures at this time.  The patient has been explained the plan and agrees with it.      Lucilla Lame, MD. Mar[facility] Regal    Note: This dictation was prepared with Dragon dictation along with smaller phrase technology. Any transcriptional errors that result from this process are unintentional.

## 2021-07-28 ENCOUNTER — Telehealth (INDEPENDENT_AMBULATORY_CARE_PROVIDER_SITE_OTHER): Payer: Self-pay | Admitting: Neurology

## 2021-07-28 DIAGNOSIS — Z91199 Patient's noncompliance with other medical treatment and regimen due to unspecified reason: Secondary | ICD-10-CM

## 2021-07-28 NOTE — Progress Notes (Signed)
PATIENT NO SHOWED. (PRE-CHARTING BELOW)       Provider:  Dr Jaynee Eagles Requesting Provider: Burnard Hawthorne, FNP Primary Care Provider:  Burnard Hawthorne, FNP  CC:  dizziness/lightheaded  Interval history:  EEG showed no seizures, but slowing (could be from drowsiness/memory loss) But EEG showed irregular HR at 42 and asked him to f/u with pcp.  MRI unremarkable with age related atrophy and mild chronic small-vessel change of the cerebral hemispheric white matter.DAT scan NOT consistent with Parkinson's disease, does not explain his gait abnormality. 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 No reduced radiotracer activity in basal ganglia to suggest Parkinson's syndrome pathology.Of note, DaTSCAN is not diagnostic of Parkinsonian syndromes, which remains a clinical diagnosis. DaTscan is an adjuvant test to aid in the clinical diagnosis of Parkinsonian syndromes.)  HPI:  Todd Clay is a 76 y.o. male here as requested by Burnard Hawthorne, FNP for syncope. PMHx coronary artery disease, depression, diabetes and diabetic neuropathy, hyperlipidemia, hypertension, myocardial infarction, osteomyelitis toe, proliferative diabetic retinopathy, peripheral vascular disease, retinal detachment,, lightheadedness and syncopal episodes, anxiety.  His wife is here today with him and provides much information; patient reports lightheadedness when get up and start walking 90%. Sitting makes it better. He has passed out, he had been to the ENT.  He had a syncopal episode, he felt lightheaded, he felt terrible, felt it was  coming, eyes closed, no loss of bowel or bladder, no abnormal movements, started 4-5 months ago when it started and having labile blood pressures. He has seen cardiology at [place].  He has developed a tremor in the right greater than left hand with rest, but he also has a tremor of the head and lips and legs, wife feels voice has become softer, hasn't had smell for years, no vivid dreams, no wet pillows or sheets on the ground, he has fallen he fell in [place] and he missed a step and fell. Has not fallen due to imbalance, he has difficulty walking and balance, he has neck pain, handwriting is messy and bigger difficulty writing name.No delusions or hallucinations. No constipation. He is shuffling. ED.depression and anxiety and memory disturbance as well. No other focal neurologic deficits, associated symptoms, inciting events or modifiable factors.   Reviewed notes, labs and imaging from outside physicians, which showed:  MRI brain 11/23/2020: IMPRESSION: No acute intracranial finding. Age related atrophy. Mild chronic small-vessel change of the cerebral hemispheric white matter.   CMP showed elevated glucose, BUN 19, creatinine 1.07, elevated alk phos April 28, 2021.  He has multiple labs pending including vitamin B12 ordered by Faythe Casa, TSH was normal in July 2022  Review of Systems: Patient complains of symptoms per HPI as well as the following symptoms falls,anxiety. Pertinent negatives and positives per HPI. All others negative. Physical exam: Exam: Gen: NAD, conversant, well nourised, obese, well groomed                     CV: RRR, no MRG. No Carotid Bruits. No peripheral edema, warm, nontender Eyes: Conjunctivae clear without exudates or hemorrhage  Neuro: Detailed Neurologic Exam  Speech:  Speech is normal; fluent with normal comprehension.  Cognition:  MMSE - Mini Mental State Exam 05/04/2021  Orientation to time 4  Orientation to Place 4  Registration 3   Attention/ Calculation 5  Recall 3  Language- name 2 objects 2  Language- repeat 1  Language- follow 3 step command 3  Language- read & follow direction 1  Write a sentence 1  Copy design 0  Total score 27       The patient is oriented to person, place, and time;     recent and remote memory intact;     language fluent;     normal attention, concentration,     fund of knowledge Cranial Nerves: masked facies    Pupils are pinpoint but reactive to light, pupils too small to visualize fundi attempted. 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 Extraocular movements are intact. Trigeminal sensation is intact and the muscles of mastication are normal. The face is symmetric. The palate elevates in the midline. Hearing intact. Voice is normal. Shoulder shrug is normal. The tongue has normal motion without fasciculations.   Coordination:    bradykinesia  Gait: cannot stand without using his hands. Low clearance, stooped posture, reemergent mild tremor, imbalance. Slightly wide based but has severe neuropathy due to long history of diabetes  Motor Observation:   Abnormal movements of hands at rest, coarse, rubing thumb against first finger tremor right>left Tone:    Increased right arm tone     Strength:    Strength is V/V in the upper and lower limbs.      Sensation: decreased glove and stocking, severe neuropathy     Reflex Exam:  DTR's: Ajs absent, trace patellars, 1+ left biceps and trace right     Toes:    The toes are downgoing bilaterally.   Clonus:    Clonus is absent.    Assessment/Plan:   76 y.o. male here as requested by Burnard Hawthorne, FNP for syncope. PMHx coronary artery disease, depression, diabetes and diabetic neuropathy, hyperlipidemia, hypertension, myocardial infarction, osteomyelitis toe, proliferative diabetic retinopathy, peripheral vascular disease, retinal detachment,, lightheadedness and syncopal episodes, anxiety.Patient  has a resting tremor, slightly shuffling gait, stooped posture, increased tone upper right extremity, masked facies, I suspect he has Parkinson's disease.  However he also has shaking of his head and lips which is unusual and more essential tremor even though I suspect he has Parkinson's disease we ordered a DaTscan to differentiate between the two disorders, stop zoloft 4 days prior. MRI Brain was unremarkable with age related atrophy and mild chronic small-vessel change of the cerebral hemispheric white matter.    - DAT scan NOT consistent with Parkinson's disease. Even though DAT scan negative, will send for 2nd opinion for Parkinson's Disease given his examination findings that are c/w PD.  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    - Dizziness likely cardiac origin, was orthostatic at last appointment (see below) and EEG with irregular EKG and HR 42.  Orthostatic hypotension 143/62 p72 laying, 136/55 p58 sitting, 126/62 p 72 standing. Recommend pcp or cardiology decrease/alter his blood pressure medications  Imbalance and falls: Refuses PT. Refuses to use a walking aid.  Lightheadedness and syncope: Orthostatic hypotension  may autonomic if he has Parkinson's disease  he was on multiple blood pressure medications recommend management by pcp.   No orders of the defined types were placed in this encounter.    Cc: Burnard Hawthorne, FNP,  Arnett, Yvetta Coder, FNP  Sarina Ill, MD  [facility] [address] [place], [place] [address]  Phone [phone] Fax [phone]  I spent over 60 minutes of face-to-face and non-face-to-face time with patient on the  No diagnosis found.  diagnosis.  This included previsit  chart review, lab review, study review, order entry, electronic health record documentation, patient education on the different diagnostic and therapeutic options, counseling and coordination of care, risks and benefits of management, compliance, or risk factor reduction

## 2021-08-04 ENCOUNTER — Encounter: Payer: Self-pay | Admitting: Family

## 2021-08-04 DIAGNOSIS — R748 Abnormal levels of other serum enzymes: Secondary | ICD-10-CM | POA: Insufficient documentation

## 2021-08-17 ENCOUNTER — Encounter: Payer: Self-pay | Admitting: Family

## 2021-08-17 NOTE — Telephone Encounter (Signed)
I sent mychart message to pt.

## 2021-08-18 ENCOUNTER — Emergency Department: Payer: Medicare PPO

## 2021-08-18 ENCOUNTER — Observation Stay
Admission: EM | Admit: 2021-08-18 | Discharge: 2021-08-19 | Disposition: A | Discharge status: Home / Self Care | Payer: Medicare PPO | Attending: Internal Medicine | Admitting: Internal Medicine

## 2021-08-18 ENCOUNTER — Other Ambulatory Visit: Payer: Self-pay

## 2021-08-18 ENCOUNTER — Observation Stay: Payer: Medicare PPO

## 2021-08-18 DIAGNOSIS — R739 Hyperglycemia, unspecified: Secondary | ICD-10-CM | POA: Diagnosis not present

## 2021-08-18 DIAGNOSIS — Z951 Presence of aortocoronary bypass graft: Secondary | ICD-10-CM | POA: Diagnosis not present

## 2021-08-18 DIAGNOSIS — G4733 Obstructive sleep apnea (adult) (pediatric): Secondary | ICD-10-CM | POA: Diagnosis present

## 2021-08-18 DIAGNOSIS — E119 Type 2 diabetes mellitus without complications: Secondary | ICD-10-CM | POA: Diagnosis not present

## 2021-08-18 DIAGNOSIS — R4182 Altered mental status, unspecified: Principal | ICD-10-CM | POA: Insufficient documentation

## 2021-08-18 DIAGNOSIS — R410 Disorientation, unspecified: Secondary | ICD-10-CM | POA: Diagnosis present

## 2021-08-18 DIAGNOSIS — I10 Essential (primary) hypertension: Secondary | ICD-10-CM

## 2021-08-18 DIAGNOSIS — E118 Type 2 diabetes mellitus with unspecified complications: Secondary | ICD-10-CM | POA: Diagnosis present

## 2021-08-18 DIAGNOSIS — I110 Hypertensive heart disease with heart failure: Secondary | ICD-10-CM | POA: Insufficient documentation

## 2021-08-18 DIAGNOSIS — Z79899 Other long term (current) drug therapy: Secondary | ICD-10-CM | POA: Insufficient documentation

## 2021-08-18 DIAGNOSIS — Z20822 Contact with and (suspected) exposure to covid-19: Secondary | ICD-10-CM | POA: Insufficient documentation

## 2021-08-18 DIAGNOSIS — Z7982 Long term (current) use of aspirin: Secondary | ICD-10-CM | POA: Insufficient documentation

## 2021-08-18 DIAGNOSIS — I2510 Atherosclerotic heart disease of native coronary artery without angina pectoris: Secondary | ICD-10-CM | POA: Insufficient documentation

## 2021-08-18 DIAGNOSIS — E663 Overweight: Secondary | ICD-10-CM | POA: Diagnosis present

## 2021-08-18 DIAGNOSIS — I5032 Chronic diastolic (congestive) heart failure: Secondary | ICD-10-CM | POA: Diagnosis not present

## 2021-08-18 DIAGNOSIS — R531 Weakness: Secondary | ICD-10-CM | POA: Diagnosis not present

## 2021-08-18 DIAGNOSIS — Z794 Long term (current) use of insulin: Secondary | ICD-10-CM | POA: Diagnosis not present

## 2021-08-18 LAB — URINALYSIS, ROUTINE W REFLEX MICROSCOPIC
Bacteria, UA: NONE SEEN
Bilirubin Urine: NEGATIVE
Glucose, UA: NEGATIVE mg/dL
Ketones, ur: 20 mg/dL — AB
Leukocytes,Ua: NEGATIVE
Nitrite: NEGATIVE
Protein, ur: 100 mg/dL — AB
Specific Gravity, Urine: 1.027 (ref 1.005–1.030)
Squamous Epithelial / HPF: NONE SEEN (ref 0–5)
pH: 5 (ref 5.0–8.0)

## 2021-08-18 LAB — CBC WITH DIFFERENTIAL/PLATELET
Abs Immature Granulocytes: 0.03 10*3/uL (ref 0.00–0.07)
Basophils Absolute: 0 10*3/uL (ref 0.0–0.1)
Basophils Relative: 0 %
Eosinophils Absolute: 0 10*3/uL (ref 0.0–0.5)
Eosinophils Relative: 1 %
HCT: 39.3 % (ref 39.0–52.0)
Hemoglobin: 13.8 g/dL (ref 13.0–17.0)
Immature Granulocytes: 0 %
Lymphocytes Relative: 25 %
Lymphs Abs: 2.1 10*3/uL (ref 0.7–4.0)
MCH: 32.6 pg (ref 26.0–34.0)
MCHC: 35.1 g/dL (ref 30.0–36.0)
MCV: 92.9 fL (ref 80.0–100.0)
Monocytes Absolute: 0.6 10*3/uL (ref 0.1–1.0)
Monocytes Relative: 6 %
Neutro Abs: 5.9 10*3/uL (ref 1.7–7.7)
Neutrophils Relative %: 68 %
Platelets: 271 10*3/uL (ref 150–400)
RBC: 4.23 MIL/uL (ref 4.22–5.81)
RDW: 14.6 % (ref 11.5–15.5)
WBC: 8.7 10*3/uL (ref 4.0–10.5)
nRBC: 0 % (ref 0.0–0.2)

## 2021-08-18 LAB — GLUCOSE, CAPILLARY
Glucose-Capillary: 123 mg/dL — ABNORMAL HIGH (ref 70–99)
Glucose-Capillary: 132 mg/dL — ABNORMAL HIGH (ref 70–99)

## 2021-08-18 LAB — COMPREHENSIVE METABOLIC PANEL
ALT: 29 U/L (ref 0–44)
AST: 46 U/L — ABNORMAL HIGH (ref 15–41)
Albumin: 3.5 g/dL (ref 3.5–5.0)
Alkaline Phosphatase: 82 U/L (ref 38–126)
Anion gap: 12 (ref 5–15)
BUN: 36 mg/dL — ABNORMAL HIGH (ref 8–23)
CO2: 19 mmol/L — ABNORMAL LOW (ref 22–32)
Calcium: 8.8 mg/dL — ABNORMAL LOW (ref 8.9–10.3)
Chloride: 106 mmol/L (ref 98–111)
Creatinine, Ser: 1.44 mg/dL — ABNORMAL HIGH (ref 0.61–1.24)
GFR, Estimated: 51 mL/min — ABNORMAL LOW (ref >60)
Glucose, Bld: 247 mg/dL — ABNORMAL HIGH (ref 70–99)
Potassium: 3.8 mmol/L (ref 3.5–5.1)
Sodium: 137 mmol/L (ref 135–145)
Total Bilirubin: 0.9 mg/dL (ref 0.3–1.2)
Total Protein: 6.8 g/dL (ref 6.5–8.1)

## 2021-08-18 LAB — PROCALCITONIN: Procalcitonin: 0.15 ng/mL

## 2021-08-18 LAB — URINE DRUG SCREEN, QUALITATIVE (ARMC ONLY)
Amphetamines, Ur Screen: NOT DETECTED
Barbiturates, Ur Screen: NOT DETECTED
Benzodiazepine, Ur Scrn: NOT DETECTED
Cannabinoid 50 Ng, Ur ~~LOC~~: NOT DETECTED
Cocaine Metabolite,Ur ~~LOC~~: NOT DETECTED
MDMA (Ecstasy)Ur Screen: NOT DETECTED
Methadone Scn, Ur: NOT DETECTED
Opiate, Ur Screen: NOT DETECTED
Phencyclidine (PCP) Ur S: NOT DETECTED
Tricyclic, Ur Screen: NOT DETECTED

## 2021-08-18 LAB — TROPONIN I (HIGH SENSITIVITY)
Troponin I (High Sensitivity): 27 ng/L — ABNORMAL HIGH (ref <18)
Troponin I (High Sensitivity): 19 ng/L — ABNORMAL HIGH (ref <18)

## 2021-08-18 LAB — AMMONIA: Ammonia: <10 umol/L (ref 9–35)

## 2021-08-18 LAB — CBG MONITORING, ED: Glucose-Capillary: 234 mg/dL — ABNORMAL HIGH (ref 70–99)

## 2021-08-18 LAB — ETHANOL: Alcohol, Ethyl (B): <10 mg/dL (ref <10)

## 2021-08-18 LAB — RESP PANEL BY RT-PCR (FLU A&B, COVID) ARPGX2
Influenza A by PCR: NEGATIVE
Influenza B by PCR: NEGATIVE
SARS Coronavirus 2 by RT PCR: NEGATIVE

## 2021-08-18 LAB — FOLATE: Folate: 43 ng/mL (ref >5.9)

## 2021-08-18 LAB — VITAMIN B12: Vitamin B-12: 409 pg/mL (ref 180–914)

## 2021-08-18 LAB — TSH: TSH: 4.277 u[IU]/mL (ref 0.350–4.500)

## 2021-08-18 IMAGING — MR MR HEAD W/O CM
6 series · 48 of 48 positions shown · non-contrast
Comparison: CT head [DATE]

CLINICAL DATA: Delirium

EXAM:
MRI HEAD WITHOUT CONTRAST
TECHNIQUE: Multiplanar, multiecho pulse sequences of the brain and surrounding
structures were obtained without intravenous contrast.

[Series 2: ax dwi_tracew · axial · 3.0mm · 1.31mm/px · z∈[-109,+47]mm · 10 of 48 slices shown]
[im 1/48]
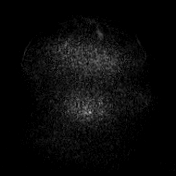
[im 6/48]
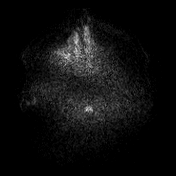
[im 11/48]
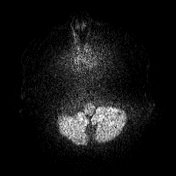
[im 16/48]
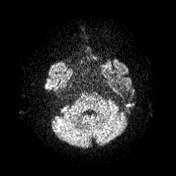
[im 21/48]
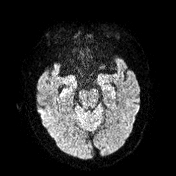
[im 27/48]
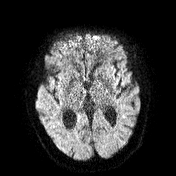
[im 32/48]
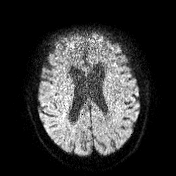
[im 37/48]
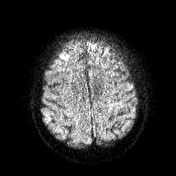
[im 42/48]
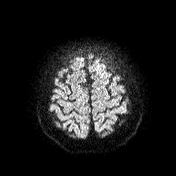
[im 48/48]
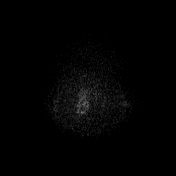

[Series 3: ax dwi_adc · axial · 3.0mm · 1.31mm/px · z∈[-109,+47]mm · 10 of 48 slices shown]
[im 1/48]
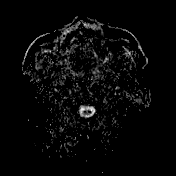
[im 6/48]
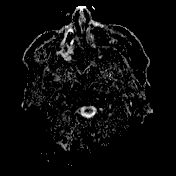
[im 11/48]
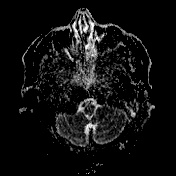
[im 16/48]
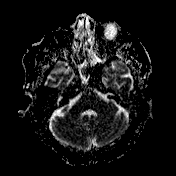
[im 21/48]
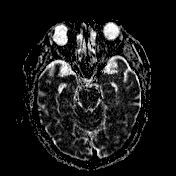
[im 27/48]
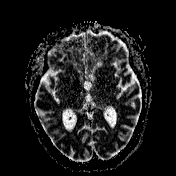
[im 32/48]
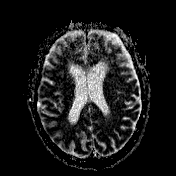
[im 37/48]
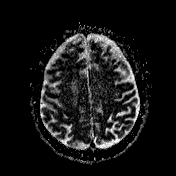
[im 42/48]
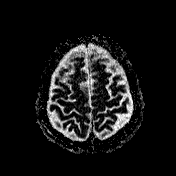
[im 48/48]
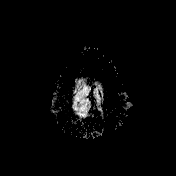

[Series 5: cor dwi_tracew · coronal · 5.0mm · 1.31mm/px · 8 of 38 slices shown]
[im 1/38]
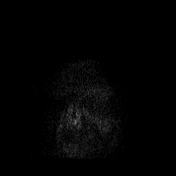
[im 6/38]
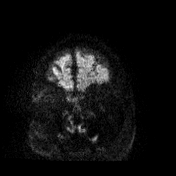
[im 11/38]
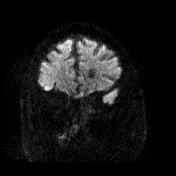
[im 16/38]
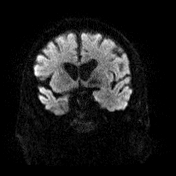
[im 22/38]
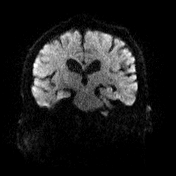
[im 27/38]
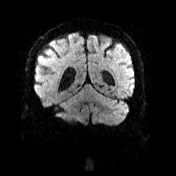
[im 32/38]
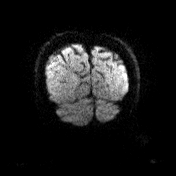
[im 38/38]
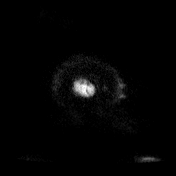

[Series 6: cor dwi_adc · coronal · 5.0mm · 1.31mm/px · 8 of 38 slices shown]
[im 1/38]
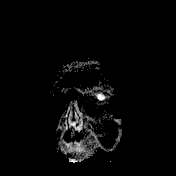
[im 6/38]
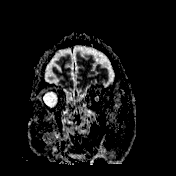
[im 11/38]
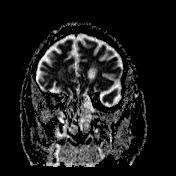
[im 16/38]
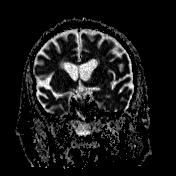
[im 22/38]
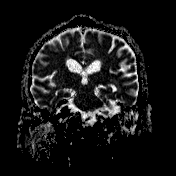
[im 27/38]
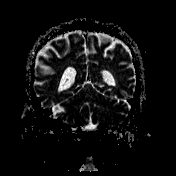
[im 32/38]
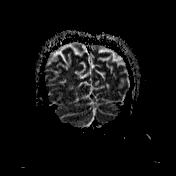
[im 38/38]
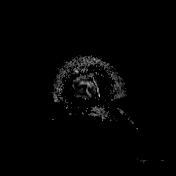

[Series 7: FLAIR · axial · 5.0mm · 1.20mm/px · z∈[-100,+56]mm · 6 of 27 slices shown]
[im 1/27]
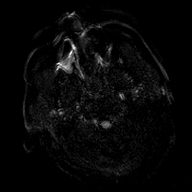
[im 6/27]
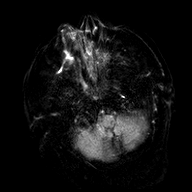
[im 11/27]
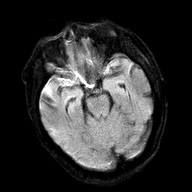
[im 16/27]
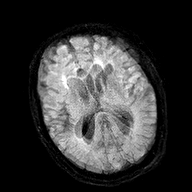
[im 21/27]
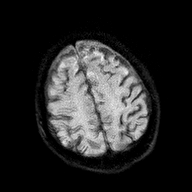
[im 27/27]
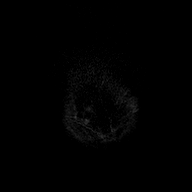

[Series 8: T2-star · axial · 5.0mm · 0.45mm/px · z∈[-100,+56]mm · 6 of 27 slices shown]
[im 1/27]
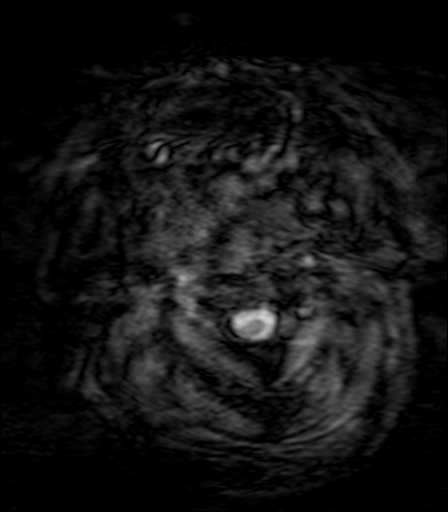
[im 6/27]
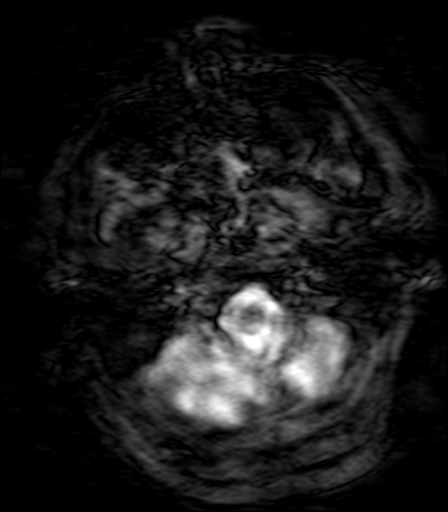
[im 11/27]
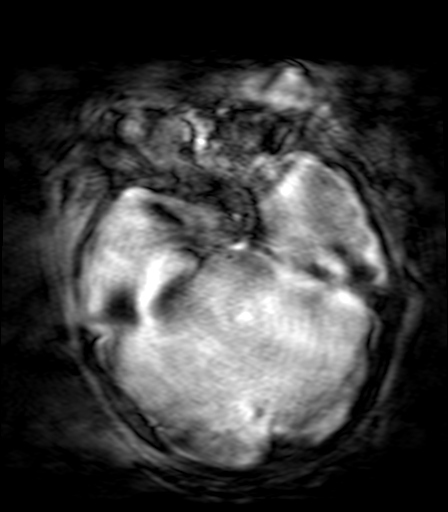
[im 16/27]
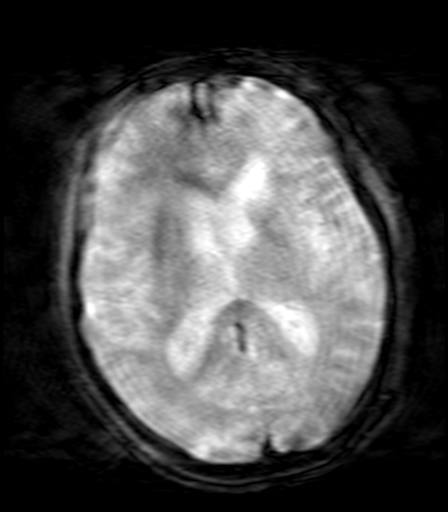
[im 21/27]
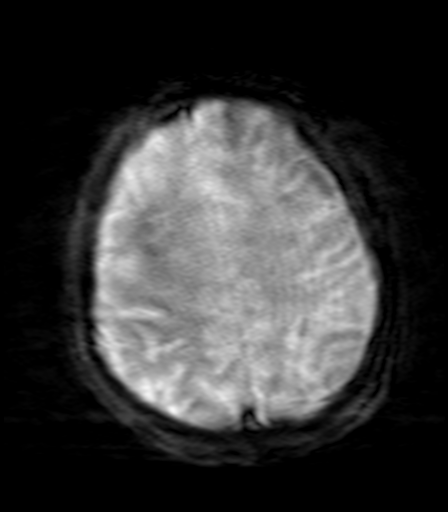
[im 27/27]
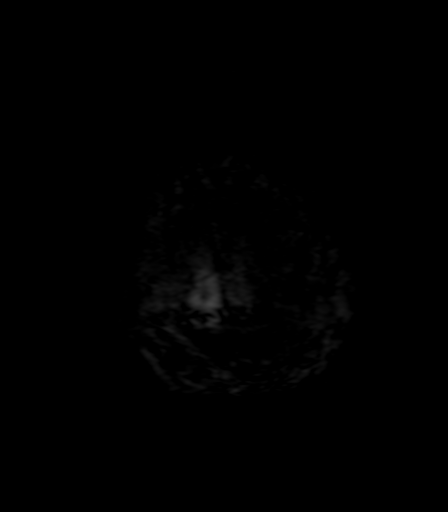

[48 of 48 positions shown; findings below may reference images not displayed]

FINDINGS: Brain: Motion degraded study. Patient not able to complete all
sequences. Limited study was obtained.

Negative for acute infarct.

Generalized atrophy without hydrocephalus. White matter
hyperintensity consistent with chronic microvascular ischemia. No
mass lesion or midline shift.
IMPRESSION: Study limited by motion and inability to complete the study.

Negative for acute infarct.

## 2021-08-18 MED ORDER — DROPERIDOL 2.5 MG/ML IJ SOLN
2.5000 mg | Freq: Once | INTRAMUSCULAR | Status: AC
  Administered 2021-08-18: 2.5 mg via INTRAVENOUS
  Filled 2021-08-18: qty 2

## 2021-08-18 MED ORDER — IRBESARTAN 75 MG PO TABS
37.5000 mg | ORAL_TABLET | Freq: Every day | ORAL | Status: DC
Start: 2021-08-18 — End: 2021-08-19
  Administered 2021-08-19: 37.5 mg via ORAL
  Filled 2021-08-18 (×2): qty 0.5

## 2021-08-18 MED ORDER — HALOPERIDOL LACTATE 5 MG/ML IJ SOLN
5.0000 mg | Freq: Once | INTRAMUSCULAR | Status: AC
  Administered 2021-08-18: 5 mg via INTRAVENOUS
  Filled 2021-08-18: qty 1

## 2021-08-18 MED ORDER — INSULIN GLARGINE-YFGN 100 UNIT/ML ~~LOC~~ SOLN
12.0000 [IU] | Freq: Every day | SUBCUTANEOUS | Status: DC
  Administered 2021-08-19: 12 [IU] via SUBCUTANEOUS
  Filled 2021-08-18 (×2): qty 0.12

## 2021-08-18 MED ORDER — HALOPERIDOL LACTATE 5 MG/ML IJ SOLN
INTRAMUSCULAR | Status: AC
  Filled 2021-08-18: qty 1

## 2021-08-18 MED ORDER — SERTRALINE HCL 50 MG PO TABS
50.0000 mg | ORAL_TABLET | Freq: Every day | ORAL | Status: DC
  Administered 2021-08-18: 50 mg via ORAL
  Filled 2021-08-18: qty 1

## 2021-08-18 MED ORDER — INSULIN ASPART 100 UNIT/ML IJ SOLN
0.0000 [IU] | Freq: Three times a day (TID) | INTRAMUSCULAR | Status: DC
  Administered 2021-08-19: 3 [IU] via SUBCUTANEOUS
  Administered 2021-08-19: 2 [IU] via SUBCUTANEOUS
  Filled 2021-08-18 (×2): qty 1

## 2021-08-18 MED ORDER — HYDRALAZINE HCL 20 MG/ML IJ SOLN
5.0000 mg | INTRAMUSCULAR | Status: DC | PRN
  Administered 2021-08-18 – 2021-08-19 (×2): 5 mg via INTRAVENOUS
  Filled 2021-08-18 (×2): qty 1

## 2021-08-18 MED ORDER — LORAZEPAM 2 MG/ML IJ SOLN
0.5000 mg | Freq: Once | INTRAMUSCULAR | Status: AC | PRN
  Administered 2021-08-18: 1 mg via INTRAVENOUS
  Filled 2021-08-18: qty 1

## 2021-08-18 MED ORDER — LORAZEPAM 2 MG/ML IJ SOLN
1.0000 mg | Freq: Once | INTRAMUSCULAR | Status: AC
  Administered 2021-08-18: 1 mg via INTRAVENOUS
  Filled 2021-08-18: qty 1

## 2021-08-18 MED ORDER — LACTATED RINGERS IV BOLUS
1000.0000 mL | Freq: Once | INTRAVENOUS | Status: AC
  Administered 2021-08-18: 1000 mL via INTRAVENOUS

## 2021-08-18 MED ORDER — CLOPIDOGREL BISULFATE 75 MG PO TABS
75.0000 mg | ORAL_TABLET | Freq: Every day | ORAL | Status: DC
  Administered 2021-08-19: 75 mg via ORAL
  Filled 2021-08-18: qty 1

## 2021-08-18 MED ORDER — DROPERIDOL 2.5 MG/ML IJ SOLN
2.5000 mg | Freq: Once | INTRAMUSCULAR | Status: AC
Start: 2021-08-18 — End: 2021-08-18
  Administered 2021-08-18: 2.5 mg via INTRAVENOUS
  Filled 2021-08-18: qty 2

## 2021-08-18 MED ORDER — INSULIN ASPART 100 UNIT/ML IJ SOLN
4.0000 [IU] | Freq: Three times a day (TID) | INTRAMUSCULAR | Status: DC
  Administered 2021-08-19: 4 [IU] via SUBCUTANEOUS
  Filled 2021-08-18: qty 1

## 2021-08-18 MED ORDER — ASPIRIN EC 81 MG PO TBEC
81.0000 mg | DELAYED_RELEASE_TABLET | Freq: Every day | ORAL | Status: DC
  Administered 2021-08-19: 81 mg via ORAL
  Filled 2021-08-18: qty 1

## 2021-08-18 NOTE — Assessment & Plan Note (Addendum)
?   SSI while inpatient.  A1c at 7.0.  Admits to some highs and lows, but overall CBGs have been stable. ?

## 2021-08-18 NOTE — ED Provider Notes (Signed)
? ?[facility] ?Provider Note ? ? ? Event Date/Time  ? First MD Initiated Contact with Patient 08/18/21 [phone]   ?  (approximate) ? ? ?History  ? ?Chief Complaint ?Altered Mental Status ? ? ?HPI ? ?Todd Clay is a 76 y.o. male with past medical history of hypertension, hyperlipidemia, diabetes, CAD status post CABG, and CHF who presents to the ED for altered mental status.  History is limited due to patient's confusion and majority of history is obtained from EMS.  They state that family noticed patient was becoming gradually more confused over the course of last night and this morning.  Family had stated patient is typically alert and oriented x4 with no history of dementia or other confusion.  Patient states that he "feels terrible" but denies any specific areas of pain or other symptoms.  He does report subjective fevers, denies cough, chest pain, shortness of breath, nausea, vomiting, abdominal pain, or dysuria.  He is not aware of any recent new medications.  EMS reports that patient was oriented only to person and place on their assessment. ?  ? ? ?Physical Exam  ? ?Triage Vital Signs: ?ED Triage Vitals [08/18/21 0939]  ?Enc Vitals Group  ?   BP   ?   Pulse   ?   Resp   ?   Temp   ?   Temp src   ?   SpO2   ?   Weight 192 lb 0.3 oz (87.1 kg)  ?   Height '5\' 8"'$  (1.727 m)  ?   Head Circumference   ?   Peak Flow   ?   Pain Score   ?   Pain Loc   ?   Pain Edu?   ?   Excl. in [place]?   ? ? ?Most recent vital signs: ?Vitals:  ? 08/18/21 1200 08/18/21 1230  ?BP: (!) 167/81 102/69  ?Pulse: (!) 101 83  ?Resp: (!) 25 (!) 24  ?Temp:    ?SpO2: 99% 96%  ? ? ?Constitutional: Alert and oriented to person, place, and time but not situation. ?Eyes: Conjunctivae are normal.  Pupils equal, round, and reactive to light bilaterally. ?Head: Atraumatic. ?Nose: No congestion/rhinnorhea. ?Mouth/Throat: Mucous membranes are moist.  ?Neck: No midline cervical spine tenderness to palpation. ?Cardiovascular: Normal  rate, regular rhythm. Grossly normal heart sounds.  2+ radial pulses bilaterally. ?Respiratory: Normal respiratory effort.  No retractions. Lungs CTAB. ?Gastrointestinal: Soft and nontender. No distention. ?Musculoskeletal: No lower extremity tenderness nor edema.  ?Neurologic:  Normal speech and language. No gross focal neurologic deficits are appreciated. ? ? ? ?ED Results / Procedures / Treatments  ? ?Labs ?(all labs ordered are listed, but only abnormal results are displayed) ?Labs Reviewed  ?COMPREHENSIVE METABOLIC PANEL - Abnormal; Notable for the following components:  ?    Result Value  ? CO2 19 (*)   ? Glucose, Bld 247 (*)   ? BUN 36 (*)   ? Creatinine, Ser 1.44 (*)   ? Calcium 8.8 (*)   ? AST 46 (*)   ? GFR, Estimated 51 (*)   ? All other components within normal limits  ?CBG MONITORING, ED - Abnormal; Notable for the following components:  ? Glucose-Capillary 234 (*)   ? All other components within normal limits  ?TROPONIN I (HIGH SENSITIVITY) - Abnormal; Notable for the following components:  ? Troponin I (High Sensitivity) 27 (*)   ? All other components within normal limits  ?TROPONIN I (HIGH SENSITIVITY) -  Abnormal; Notable for the following components:  ? Troponin I (High Sensitivity) 19 (*)   ? All other components within normal limits  ?RESP PANEL BY RT-PCR (FLU A&B, COVID) ARPGX2  ?CBC WITH DIFFERENTIAL/PLATELET  ?PROCALCITONIN  ?URINALYSIS, ROUTINE W REFLEX MICROSCOPIC  ?URINE DRUG SCREEN, QUALITATIVE ([facility] ONLY)  ? ? ? ?EKG ? ?ED ECG REPORT ?Tempie Hoist, the attending physician, personally viewed and interpreted this ECG. ? ? Date: 08/18/2021 ? EKG Time: 9:39 ? Rate: 96 ? Rhythm: normal sinus rhythm, frequent PVC's noted ? Axis: Normal ? Intervals:none ? ST&T Change: None ? ?RADIOLOGY ?CT head reviewed by me with no obvious hemorrhage or midline shift.  Chest x-ray reviewed by me with no infiltrate, edema, or effusion. ? ?PROCEDURES: ? ?Critical Care performed:  No ? ?Procedures ? ? ?MEDICATIONS ORDERED IN ED: ?Medications  ?lactated ringers bolus 1,000 mL (1,000 mLs Intravenous New Bag/Given 08/18/21 1032)  ?droperidol (INAPSINE) 2.5 MG/ML injection 2.5 mg (2.5 mg Intravenous Given 08/18/21 1032)  ?droperidol (INAPSINE) 2.5 MG/ML injection 2.5 mg (2.5 mg Intravenous Given 08/18/21 1116)  ?LORazepam (ATIVAN) injection 1 mg (1 mg Intravenous Given 08/18/21 1116)  ? ? ? ?IMPRESSION / MDM / ASSESSMENT AND PLAN / ED COURSE  ?I reviewed the triage vital signs and the nursing notes. ?             ?               ? ?76 y.o. male with past medical history of hypertension, hyperlipidemia, diabetes, CAD status post CABG, and CHF who presents to the ED with gradually worsening altered mental status since yesterday evening with no history of dementia. ? ?Differential diagnosis includes, but is not limited to, stroke, other intracranial process, sepsis, UTI, pneumonia, viral syndrome, electrolyte abnormality, medication effect. ? ?Patient is nontoxic-appearing and in no acute distress, but does appear acutely confused compared to his baseline, is able to state the year but repeats the year multiple times when asked what month it is.  He does not appear to have any focal neurologic deficits on exam, low suspicion for stroke but we will further assess with CT head.  We will further assess for infectious process with chest x-ray and UA, check for electrolyte abnormality. ? ?CT head is negative for acute process.  Unfortunately, patient has become increasingly agitated here in the ED and required IV calming medications to ensure safety of patient and staff as well as to facilitate medical work-up.  Labs are reassuring with no electrolyte abnormality, mild AKI noted, LFTs within normal limits.  Patient does have a mildly elevated troponin but this is likely due to his mild AKI.  Procalcitonin is within normal limits and I doubt sepsis or infectious etiology at this time, chest x-ray is  unremarkable but UA is pending.  Etiology of his altered mental status remains unclear and case was discussed with hospitalist for admission. ? ?  ? ? ?FINAL CLINICAL IMPRESSION(S) / ED DIAGNOSES  ? ?Final diagnoses:  ?Altered mental status, unspecified altered mental status type  ? ? ? ?Rx / DC Orders  ? ?ED Discharge Orders   ? ? None  ? ?  ? ? ? ?Note:  This document was prepared using Dragon voice recognition software and may include unintentional dictation errors. ?  ?Blake Divine, MD ?08/18/21 1248 ? ?

## 2021-08-18 NOTE — Assessment & Plan Note (Addendum)
?   Patient back to baseline.  Able to ambulate without difficulty.  No findings of infection, uremia, electrolyte abnormality.  MRI and CT scan of head unremarkable.  Given history, I suspect that patient and his wife told him, she was ill for several days with a gastroenteritis which can he may have gotten some symptoms from.  I suspect he became dehydrated and then his Neurontin build up in his system.  This would account for his extreme somnolence on Tuesday, 3/21 and then delirium after.  With time and fluids, patient became much more alert and is currently back to normal.  ?

## 2021-08-18 NOTE — Assessment & Plan Note (Addendum)
?   Hx CABG and CHF, no apparent active CHF, no ACS ?? Continue telemetry and secondary prevention meds ?

## 2021-08-18 NOTE — ED Triage Notes (Signed)
PT bib EMS from home. Per family increased agitation and AMS 9 hours ago. PT does not complain of any pain. Pt A&O x 2. Pt states he just does feel well.  ?

## 2021-08-18 NOTE — ED Notes (Signed)
Pt resting. Even rise and fall of chest. Bed alarm in place. Call bell in reach. Bed in lowest locked position.  ?

## 2021-08-18 NOTE — H&P (Addendum)
States ? ? ?HISTORY AND PHYSICAL ? ?Patient: Todd Clay 76 y.o. male ?MRN: [id number] ? ?Today 08/18/21 is hospital day 0 after admission on 08/18/2021  9:41 AM ? ?RECORD REVIEW AND HOSPITAL COURSE: ?Todd Clay is a 76 y.o. male with past medical history of hypertension, hyperlipidemia, diabetes, CAD status post CABG, and CHF who presents to the ED for altered mental status.  History is limited due to patient's confusion and majority of history is obtained from ED notes: EMS stated that family noticed patient was becoming gradually more confused over the course of last night and this morning.  Family had stated patient was typically alert and oriented x4 with no history of dementia or other confusion.  Patient stated to ED provider that he "feels terrible" but denied any specific areas of pain or other symptoms.  He reported subjective fevers, denied cough, chest pain, shortness of breath, nausea, vomiting, abdominal pain, or dysuria.  He was not aware of any recent new medications.  EMS reported that patient was oriented only to person and place on their assessment. ?In ED,  was able to state the year but repeats the year multiple times when asked what month it is.  He did not appear to have any focal neurologic deficits on exam, low suspicion for stroke and CT head no concerns. Was increasingly agitated here in the ED and required IV calming medications to ensure safety of patient and staff as well as to facilitate medical work-up.  Labs were reassuring with no electrolyte abnormality, mild AKI noted, LFTs within normal limits.  Patient had a mildly elevated troponin but likely due to his mild AKI.  Procalcitonin was within normal limits doubt sepsis or infectious etiology, chest x-ray unremarkable, UA no evidence for UTI, UDS negative  Etiology of his altered mental status remains unclear and case was referred to hospitalist for admission. ? ?Procedures and Significant Results:  ?CT head 08/18/21 no  acute findings ?MRI brain 08/18/21 limited d/t motion artifact despite sedation, but no significant abnormality to explain delirium  ? ?Consultants:  ?None at this time  ? ? ? ?SUBJECTIVE:  ?mental status of patient limits history - he does deny pain or SOB, answers "2012" to questions of what year it is, who is president. Answers "hospital" to location question, cannot elaborate on why he is here. He follows commands "squeeze my hands, wiggle your toes."   ?Rechecked patient later while he was on the floor, spoke to his wife who was at bedside.  They she states she is getting over some sort of GI bug, thinks he may have had something similar but not as severe, states yesterday he slept for most of the day.  She does not think that he would have taken any of his medications out of the ordinary he is typically very regimented about his medicines (I did specifically ask about potential accidental overdose of gabapentin for instance, she actually states that this is something that he has been tapering off of anyway).  ? ? ? ? ?ASSESSMENT & PLAN ? ?Delirium ?Uncertain etiology ?UA showed no concern for UTI, UDS negative.  ?Home meds contributing to delirium: possible SSRI toxicity, possible gabapentin toxicity. Continue SSRI at prescribed dose to avoid withdrawal, hold gabapentin though there is some risk if he has taken this in excess over long periods of time that abrupt d/c may result in increased seizure risk, place on seizure precautions, he has been dosed w/ ativan earlier today d/t anxiety/agitation.  Based  on conversation with wife, accidental overdose of gabapentin seems unlikely ?Pending broad lab w/u including TSH, B12, HIV, RPR, EtOH, Ammonia, Folate  ?Pending brain MRI given CT head no acute findings and thus far no other reason for delirium: MRI showed no findings to explain delirium though study was limited d/t motion artifact despite sedation  ?No known significant psychiatric history ?Record review  notes vague memory problems and orthostatic hypotension/dizziness, was ruled out for parkinsonism by neurology, MRI brain last year ok, consider involvement of neurology pending other imaging/labs as above  ?Has received sedating medications in ED and prior to MRI for agitation, difficult to ascertain to what extent this has affected mentation as I had not seen him prior to receiving psychoactive medications.  We will try to hold off on treatments for agitation if at all possible ?Swallow eval pending, if unable to take PO can transition meds to IV  ? ?CAD (coronary artery disease) ?Hx CABG and CHF, no apparent active CHF, no ACS ?Continue telemetry and secondary prevention meds ? ?Diabetes mellitus with complication ([place]) ?SSI while inpatient ? ?OSA (obstructive sleep apnea) ?Has previously declined CPAP ? ?Essential hypertension ?Labile BP at home, hx orthostatic hypotension ?Changed mental status may possibly be hypertensive urgency/emergency, will add antihypertensive Rx prn w/ hydralazine ?On Losartan at home, will switch to ARB w/ longer half life, may help labile BP, start low dose irbesartan 37.5 mg and titrate up per BP response ? ? ? ?VTE Ppx: SCD ?CODE STATUS   Code Status: Full Code ?Admitted from: home ?Expected Dispo: pending  ?Barriers to discharge: continued medical workup  ?Family communication: Discussion with wife at bedside on medical floor ? ? ? ? ? ? ? ? ? ? ? ? ? ?Past Medical History:  ?Diagnosis Date  ? CAD (coronary artery disease)   ? Depression   ? Diabetes mellitus with complication ([place])   ? Diabetic neuropathy ([place])   ? History of chickenpox   ? HLD (hyperlipidemia)   ? HTN (hypertension)   ? 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 Osteomyelitis ([place])   ? Left 2nd and 3rd metatarsal  ? Proliferative diabetic retinopathy ([place])   ? OU  ? PVD (peripheral vascular disease) ([place])   ? Retinal detachment   ? OD  ? ? ?Past Surgical History:  ?Procedure Laterality Date  ? AMPUTATION OF  REPLICATED TOES    ? Left 2nd and 3rd metatarsal  ? CARDIAC CATHETERIZATION    ? CORONARY ANGIOPLASTY    ? Patient has a total of 5 stents placed.  ? CORONARY ARTERY BYPASS GRAFT  1998  ? x1 vessel @ [facility]  ? RETINAL DETACHMENT SURGERY    ? Laser  ? STENTS    ? x 5   ? [place]  ? Laser tx OU  ? ? ?Family History  ?Problem Relation Age of Onset  ? Kidney disease Mother   ?     Kidney failure  ? Heart disease Mother   ? Heart disease Father   ?     CAD  ? Cancer Brother   ?     Head & Neck Tumor  ? Alcohol abuse Brother   ? Diabetes Maternal Aunt   ? Diabetes Maternal Uncle   ? Diabetes Maternal Grandmother   ? Cancer Maternal Grandmother   ? Diabetes Maternal Grandfather   ? Syncope episode Neg Hx   ? ?Social History:  reports that he has never  smoked. He has never used smokeless tobacco. He reports that he does not drink alcohol and does not use drugs. ? ?Allergies: No Known Allergies ? ?(Not in a hospital admission) ? ?No current facility-administered medications on file prior to encounter.  ? ?Current Outpatient Medications on File Prior to Encounter  ?Medication Sig Dispense Refill  ? aspirin EC 81 MG tablet Take 81 mg by mouth daily. Swallow whole.    ? atorvastatin (LIPITOR) 80 MG tablet Take 80 mg by mouth daily.    ? clopidogrel (PLAVIX) 75 MG tablet Take 1 tablet by mouth once daily 90 tablet 0  ? fluticasone (FLONASE) 50 MCG/ACT nasal spray USE 2 SPRAY(S) IN EACH NOSTRIL TWICE DAILY    ? furosemide (LASIX) 20 MG tablet Take 1 tablet (20 mg total) by mouth daily as needed. 90 tablet 0  ? gabapentin (NEURONTIN) 800 MG tablet Take 800 mg by mouth 3 (three) times daily.    ? insulin regular (NOVOLIN R RELION) 100 units/mL injection Inject 0.06 mLs (6 Units total) into the skin 3 (three) times daily before meals. (Patient taking differently: Inject 4-6 Units into the skin 3 (three) times daily before meals. Sliding Scale) 10 mL 2  ? losartan (COZAAR) 25 MG tablet Take 0.5 tablets (12.5 mg  total) by mouth daily. 90 tablet 1  ? sertraline (ZOLOFT) 50 MG tablet TAKE 1 TABLET BY MOUTH AT BEDTIME 90 tablet 0  ? Continuous Blood Gluc Receiver (FREESTYLE LIBRE 14 DAY READER) DEVI USE TO TEST BLOOD SUGAR

## 2021-08-18 NOTE — Assessment & Plan Note (Signed)
?   Has previously declined CPAP ?

## 2021-08-18 NOTE — Progress Notes (Signed)
Admission profile updated. ?

## 2021-08-18 NOTE — Assessment & Plan Note (Addendum)
?   Labile BP at home, hx orthostatic hypotension ?? Was hesitant to aggressively try to get his blood pressure down given episodes of orthostasis.  Some improvement with as needed hydralazine.  Does not appear to be volume overloaded. ?

## 2021-08-18 NOTE — ED Notes (Signed)
Pt calling out and family member getting upset at patient being so confused. Family member stepped outside. This RN went in to assist. Pt wanting to get out of the bed and is hard to redirect. Pt is too weak to actually get up so pt situated back into the bed with Thedore Mins, NT. Pt calmer once situated. Zach at bedside until family member returns.  ?

## 2021-08-19 ENCOUNTER — Encounter: Payer: Self-pay | Admitting: Osteopathic Medicine

## 2021-08-19 DIAGNOSIS — G4733 Obstructive sleep apnea (adult) (pediatric): Secondary | ICD-10-CM | POA: Diagnosis not present

## 2021-08-19 DIAGNOSIS — I10 Essential (primary) hypertension: Secondary | ICD-10-CM

## 2021-08-19 DIAGNOSIS — I5032 Chronic diastolic (congestive) heart failure: Secondary | ICD-10-CM

## 2021-08-19 DIAGNOSIS — R410 Disorientation, unspecified: Secondary | ICD-10-CM | POA: Diagnosis not present

## 2021-08-19 DIAGNOSIS — E663 Overweight: Secondary | ICD-10-CM | POA: Diagnosis not present

## 2021-08-19 DIAGNOSIS — R4182 Altered mental status, unspecified: Secondary | ICD-10-CM | POA: Diagnosis not present

## 2021-08-19 LAB — RPR: RPR Ser Ql: NONREACTIVE

## 2021-08-19 LAB — GLUCOSE, CAPILLARY
Glucose-Capillary: 135 mg/dL — ABNORMAL HIGH (ref 70–99)
Glucose-Capillary: 144 mg/dL — ABNORMAL HIGH (ref 70–99)
Glucose-Capillary: 148 mg/dL — ABNORMAL HIGH (ref 70–99)
Glucose-Capillary: 165 mg/dL — ABNORMAL HIGH (ref 70–99)

## 2021-08-19 LAB — HEMOGLOBIN A1C
Hgb A1c MFr Bld: 7 % — ABNORMAL HIGH (ref 4.8–5.6)
Mean Plasma Glucose: 154 mg/dL

## 2021-08-19 LAB — BRAIN NATRIURETIC PEPTIDE: B Natriuretic Peptide: 67 pg/mL (ref 0.0–100.0)

## 2021-08-19 LAB — HIV ANTIBODY (ROUTINE TESTING W REFLEX): HIV Screen 4th Generation wRfx: NONREACTIVE

## 2021-08-19 MED ORDER — HYDRALAZINE HCL 50 MG PO TABS
25.0000 mg | ORAL_TABLET | Freq: Three times a day (TID) | ORAL | Status: DC
  Administered 2021-08-19: 25 mg via ORAL
  Filled 2021-08-19: qty 1

## 2021-08-19 NOTE — Hospital Course (Addendum)
76 year old male with past medical history of hypertension, diabetes mellitus, diastolic CHF and CAD status post CABG brought in for progressively worsening confusion over the previous 24 hours.  Family reports no previous history of dementia.  Emergency room, patient was able to answer some questions appropriately and others not.  Was able to follow commands lab work noteworthy for minimal AKI and no signs of infection noted.  CT and MRI of the head unremarkable.  Patient is admitted for further work-up of delirium.  Blood pressure on day of admission ranging from 130s to 170s. ? ?By following day, patient's back to normal.  Alert and oriented x4.  Good insight. ? ?

## 2021-08-19 NOTE — Assessment & Plan Note (Addendum)
Last echocardiogram was almost 5 years ago.  At that time noted to have grade 2 diastolic dysfunction and preserved ejection fraction normal and normal.  BNP within normal limits. ?

## 2021-08-19 NOTE — Discharge Summary (Signed)
?Physician Discharge Summary ?  ?Patient: Todd Clay MRN: [id number] DOB: [birth year]  ?Admit date:     08/18/2021  ?Discharge date: 08/19/21  ?Discharge Physician: Annita Brod  ? ?PCP: Burnard Hawthorne, FNP  ? ?Recommendations at discharge:  ? ?Patient will follow-up with his PCP in 2 weeks ? ?Discharge Diagnoses: ?Active Problems: ?  Chronic diastolic CHF (congestive heart failure) ([place]) ?  Essential hypertension ?  Diabetes mellitus with complication ([place]) ?  CAD (coronary artery disease) ?  OSA (obstructive sleep apnea) ?  Overweight (BMI 25.0-29.9) ? ?Principal Problem (Resolved): ?  Delirium ? ?Hospital Course: ?76 year old male with past medical history of hypertension, diabetes mellitus, diastolic CHF and CAD status post CABG brought in for progressively worsening confusion over the previous 24 hours.  Family reports no previous history of dementia.  Emergency room, patient was able to answer some questions appropriately and others not.  Was able to follow commands lab work noteworthy for minimal AKI and no signs of infection noted.  CT and MRI of the head unremarkable.  Patient is admitted for further work-up of delirium.  Blood pressure on day of admission ranging from 130s to 170s. ? ?By following day, patient's back to normal.  Alert and oriented x4.  Good insight. ? ? ?Assessment and Plan: ?* Delirium-resolved as of 08/19/2021 ?Patient back to baseline.  Able to ambulate without difficulty.  No findings of infection, uremia, electrolyte abnormality.  MRI and CT scan of head unremarkable.  Given history, I suspect that patient and his wife told him, she was ill for several days with a gastroenteritis which can he may have gotten some symptoms from.  I suspect he became dehydrated and then his Neurontin build up in his system.  This would account for his extreme somnolence on Tuesday, 3/21 and then delirium after.  With time and fluids, patient became much more alert and is currently back to  normal.  ? ?Chronic diastolic CHF (congestive heart failure) ([place]) ?Last echocardiogram was almost 5 years ago.  At that time noted to have grade 2 diastolic dysfunction and preserved ejection fraction normal and normal.  BNP within normal limits. ? ?Essential hypertension ?Labile BP at home, hx orthostatic hypotension ?Was hesitant to aggressively try to get his blood pressure down given episodes of orthostasis.  Some improvement with as needed hydralazine.  Does not appear to be volume overloaded. ? ?Diabetes mellitus with complication ([place]) ?SSI while inpatient.  A1c at 7.0.  Admits to some highs and lows, but overall CBGs have been stable. ? ?CAD (coronary artery disease) ?Hx CABG and CHF, no apparent active CHF, no ACS ?Continue telemetry and secondary prevention meds ? ?OSA (obstructive sleep apnea) ?Has previously declined CPAP ? ?Overweight (BMI 25.0-29.9) ?Meets criteria with BMI greater than 25 ? ? ? ? ?  ? ? ?Consultants: None ?Procedures performed: None ?Disposition: Home ?Diet recommendation:  ?Discharge Diet Orders (From admission, onward)  ? ?  Start     Ordered  ? 08/19/21 0000  Diet - low sodium heart healthy       ? 08/19/21 1539  ? ?  ?  ? ?  ? ?Cardiac and Carb modified diet ?DISCHARGE MEDICATION: ?Allergies as of 08/19/2021   ?No Known Allergies ?  ? ?  ?Medication List  ?  ? ?TAKE these medications   ? ?aspirin EC 81 MG tablet ?Take 81 mg by mouth daily. Swallow whole. ?  ?atorvastatin 80 MG tablet ?Commonly known as: LIPITOR ?Take 80  mg by mouth daily. ?  ?clopidogrel 75 MG tablet ?Commonly known as: PLAVIX ?Take 1 tablet by mouth once daily ?  ?fluticasone 50 MCG/ACT nasal spray ?Commonly known as: FLONASE ?USE 2 SPRAY(S) IN EACH NOSTRIL TWICE DAILY ?  ?FreeStyle Libre 14 Day Reader Kerrin Mo ?USE TO TEST BLOOD SUGAR ?  ?furosemide 20 MG tablet ?Commonly known as: LASIX ?Take 1 tablet (20 mg total) by mouth daily as needed. ?  ?gabapentin 800 MG tablet ?Commonly known as: NEURONTIN ?Take 800 mg by  mouth 3 (three) times daily. ?  ?insulin regular 100 units/mL injection ?Commonly known as: NovoLIN R ReliOn ?Inject 0.06 mLs (6 Units total) into the skin 3 (three) times daily before meals. ?What changed:  ?how much to take ?additional instructions ?  ?losartan 25 MG tablet ?Commonly known as: COZAAR ?Take 0.5 tablets (12.5 mg total) by mouth daily. ?  ?sertraline 50 MG tablet ?Commonly known as: ZOLOFT ?TAKE 1 TABLET BY MOUTH AT BEDTIME ?  ?Tyler Aas FlexTouch 100 UNIT/ML FlexTouch Pen ?Generic drug: insulin degludec ?Inject 12 Units into the skin daily. ?  ? ?  ? ? ?Discharge Exam: ?Filed Weights  ? 08/18/21 0939  ?Weight: 87.1 kg  ? ?General: Alert and oriented x4, no acute distress ?Cardiovascular: Regular rate and rhythm, S1-S2 ?Lungs: Clear to auscultation bilaterally ? ?Condition at discharge: good ? ?The results of significant diagnostics from this hospitalization (including imaging, microbiology, ancillary and laboratory) are listed below for reference.  ? ?Imaging Studies: ?DG Chest 2 View ? ?Result Date: 08/18/2021 ?CLINICAL DATA:  Weakness EXAM: CHEST - 2 VIEW COMPARISON:  Chest x-ray dated March 27, 2006 FINDINGS: Cardiac and mediastinal contours are unchanged are within normal limits. Unchanged blunting of the left costophrenic angle, likely due to pleural thickening. Lungs are clear. No large pleural effusion or pneumothorax. IMPRESSION: No active cardiopulmonary disease. Electronically Signed   By: Yetta Glassman M.D.   On: 08/18/2021 10:10  ? ?CT Head Wo Contrast ? ?Result Date: 08/18/2021 ?CLINICAL DATA:  Mental status change, unknown cause EXAM: CT HEAD WITHOUT CONTRAST TECHNIQUE: Contiguous axial images were obtained from the base of the skull through the vertex without intravenous contrast. RADIATION DOSE REDUCTION: This exam was performed according to the departmental dose-optimization program which includes automated exposure control, adjustment of the mA and/or kV according to patient size  and/or use of iterative reconstruction technique. COMPARISON:  05/14/2009, 11/23/2020 FINDINGS: Brain: No evidence of acute infarction, hemorrhage, hydrocephalus, extra-axial collection or mass lesion/mass effect. Scattered low-density changes within the periventricular and subcortical white matter compatible with chronic microvascular ischemic change. Mild diffuse cerebral volume loss. Vascular: Atherosclerotic calcifications involving the large vessels of the skull base. No unexpected hyperdense vessel. Skull: Normal. Negative for fracture or focal lesion. Sinuses/Orbits: Extensive mucosal thickening throughout paranasal sinuses. Partial left mastoid effusion. Other: None. IMPRESSION: 1. No acute intracranial abnormality. 2. Chronic microvascular ischemic change and cerebral volume loss. 3. Extensive paranasal sinus disease. 4. Partial left mastoid effusion. Electronically Signed   By: Davina Poke D.O.   On: 08/18/2021 10:09  ? ?MR BRAIN WO CONTRAST ? ?Result Date: 08/18/2021 ?CLINICAL DATA:  Delirium EXAM: MRI HEAD WITHOUT CONTRAST TECHNIQUE: Multiplanar, multiecho pulse sequences of the brain and surrounding structures were obtained without intravenous contrast. COMPARISON:  CT head 08/18/2021 FINDINGS: Brain: Motion degraded study. Patient not able to complete all sequences. Limited study was obtained. Negative for acute infarct. Generalized atrophy without hydrocephalus. White matter hyperintensity consistent with chronic microvascular ischemia. No mass lesion or midline shift.  IMPRESSION: Study limited by motion and inability to complete the study. Negative for acute infarct. Electronically Signed   By: Franchot Gallo M.D.   On: 08/18/2021 15:58   ? ?Microbiology: ?Results for orders placed or performed during the hospital encounter of 08/18/21  ?Resp Panel by RT-PCR (Flu A&B, Covid) Nasopharyngeal Swab     Status: None  ? Collection Time: 08/18/21  9:47 AM  ? Specimen: Nasopharyngeal Swab;  Nasopharyngeal(NP) swabs in vial transport medium  ?Result Value Ref Range Status  ? SARS Coronavirus 2 by RT PCR NEGATIVE NEGATIVE Final  ?  Comment: (NOTE) ?SARS-CoV-2 target nucleic acids are NOT DETECTED. ? ?The S

## 2021-08-19 NOTE — Assessment & Plan Note (Signed)
Meets criteria with BMI greater than 25 

## 2021-08-19 NOTE — Progress Notes (Signed)
SLP Cancellation Note ? ?Patient Details ?Name: Todd Clay ?MRN: [id number] ?DOB: November 21, 1945 ? ? ?Cancelled treatment:       Reason Eval/Treat Not Completed: SLP screened, no needs identified, will sign off  ? ?SLP consult received and appreciated. Chart review completed. Attempted clinical swallowing evaluation.  ? ?Spoke with RN, prior to entering pt's room. Per RN, pt consumed thin liquids, applesauce, and cracker without difficulty this AM. Diet order placed this AM for regular consistency diet/thin liquids per RN report and chart review. Consulted with pt and pt's family (wife and daughter) at bedside. Pt and family deny need for further SLP evaluation at this time. Pt and family encouraged to notify nursing or MD if pt demonstrates new changes to swallowing. ? ?SLP to sign off at this time given the above. RN made aware. ? ?Cherrie Gauze, M.S., CCC-SLP ?Speech-Language Pathologist ?[facility] ?[phone] ([place]) ? ?Quintella Baton ?08/19/2021, 9:48 AM ?

## 2021-08-31 NOTE — Telephone Encounter (Signed)
-----   Message from Melvenia Beam, MD sent at 08/31/2021 12:40 PM EDT ----- ?His DAT scan was negative but I sent him for another opinion because he looked so parkinsonian. I will refer to [facility].  ? ?----- Message ----- ?From: Burnard Hawthorne, FNP ?Sent: 08/25/2021  12:16 PM EDT ?To: Melvenia Beam, MD ? ?Dr Jaynee Eagles, ? ?I am reaching out as I dont know where to turn for this patient.  ? ?He was admitted to [facility] 3/ 22/23 and diagnosed with delirium which was suspected due to previous gastroenteritis and buildup of gabapentin '800mg'$  TID per discharge summary. MRA brain showed continued generalized atrophy , chronic microvascular ischemia ? ? I have not seen patient however his wife has sent me a MyChart message she is very concerned and feels like he is not himself.  ? ?I know you saw him seen him in December and shared concern for Parkinson's disease. DaTSCAN was not consistent with Parkinson's disease. Do think his behaviors could be neurological? Dementia?  ? ?Should I consider psychiatry referral? ? ?  I am just not exactly sure where to go from here.  I am going to bring him in the office and certainly discuss gabapentin dose.  ? ?I believe he showed his last visit with you. Should we also arrange follow up? ? ?Appreciate any advice here.  ? ?Joycelyn Schmid ? ? ? ? ?

## 2021-09-01 ENCOUNTER — Telehealth: Payer: Self-pay | Admitting: Neurology

## 2021-09-01 NOTE — Telephone Encounter (Signed)
Sent to [facility] ph # [phone] °

## 2021-09-13 ENCOUNTER — Other Ambulatory Visit: Payer: Self-pay | Admitting: Cardiovascular Disease

## 2021-09-16 ENCOUNTER — Telehealth: Payer: Self-pay | Admitting: Family

## 2021-09-16 NOTE — Telephone Encounter (Signed)
Pt wife called in stating she talked to the provider about pt and she want to know if pt need a follow-up appt. If so can the appt be made on 5/31. Pt wife would like to be called  ?

## 2021-09-17 NOTE — Telephone Encounter (Signed)
Yes sch much sooner. This month or early may ? ?

## 2021-09-17 NOTE — Telephone Encounter (Signed)
You have some openings for 5/31 would you prefer him seen sooner then August?  ?

## 2021-09-21 NOTE — Telephone Encounter (Signed)
Just FYI patient scheduled for telephone visit tomorrow to discuss taking Xanax PRN due to increased anxiety & trouble sleeping. Pt's wife stated that patient has gotten so anxious that he took one of her Xanax & was helpful. He has even had to get up & go outside to sit to try breath to calm anxiety. ? ?He is also scheduled for follow-up 5/31. ?

## 2021-09-22 ENCOUNTER — Ambulatory Visit (INDEPENDENT_AMBULATORY_CARE_PROVIDER_SITE_OTHER): Payer: Medicare PPO | Admitting: Family

## 2021-09-22 ENCOUNTER — Encounter: Payer: Self-pay | Admitting: Family

## 2021-09-22 DIAGNOSIS — F419 Anxiety disorder, unspecified: Secondary | ICD-10-CM

## 2021-09-22 DIAGNOSIS — K5900 Constipation, unspecified: Secondary | ICD-10-CM | POA: Diagnosis not present

## 2021-09-22 DIAGNOSIS — R4182 Altered mental status, unspecified: Secondary | ICD-10-CM

## 2021-09-22 MED ORDER — SERTRALINE HCL 50 MG PO TABS: 100.0000 mg | ORAL_TABLET | Freq: Every day | ORAL | 0 refills | Status: DC

## 2021-09-22 NOTE — Progress Notes (Signed)
Verbal consent for services obtained from patient prior to services given to TELEPHONE visit:  ? ?Location of call: ? provider at work ?patient at home ? ?Names of all persons present for services: Todd Paris, NP and patient ? ?He has not any concerns for confusion, somnolence.  ?Anxiety has increased. Endorses apathy. Trouble falling asleep and staying asleep. He has been taking xanax from  his wife which has been working well.  ?Ever since hospitalized he hasnt been able to sleep. Endorses increased anxiety. He is compliant with zoloft '50mg'$  ?No si/hi ? ?Hospitalized 08/18/21 for AMS, delirium. He had somnolence, prior suspected dehydration from gastroenteritis.  ?He is no longer on gabapentin '800mg'$  TID. He had been on gabapentin for 20+ years.  Endorses numbness in feet which is intermittent and can extend to bilateral knees.  He has been using warm robe and leg pain resolved. No back pain.  ? ?DM- following with endocrine with follow up scheduled 12/08/21. He is compliant with CGM. A1c 7 one month ago ? ?He is no longer on coreg.  ?He is compliant with losartan 12.'5mg'$  ? ?He endorses trouble having normal sized bowl movement since hospitalization. He has been using colace daily with relief however no stools are very loose. No abdominal pain.  ?Scheduled for f/u 4/31/23 ?CT chest scheduled 09/28/21 ? ?Referral to [facility] from Dr Jaynee Eagles ? ? ?A/P/next steps: ? ?Problem List Items Addressed This Visit   ? ?  ? Other  ? Altered mental status  ?  Off of gabapentin and no further episodes. Awaiting second opinion with [facility] as referred by Dr Jaynee Eagles ? ?  ?  ? Anxiety and depression  ?  Uncontrolled. Increase zoloft to '100mg'$ . Advised against using xanax due to risks of cognitive impairment and advised him not to use as not prescribed to him.May consider cymbalta if zoloft ineffective. Close follow up.  ? ?  ?  ? Relevant Medications  ? sertraline (ZOLOFT) 50 MG tablet  ? Constipation  ?  New.   Advised to stop colace, start metamucil. Will follow ? ?  ?  ? ? ? ?I spent 15 min  discussing plan of care over the phone.  ? ? ? ? ? ? ? ?

## 2021-09-22 NOTE — Patient Instructions (Addendum)
Dr Jaynee Eagles placed  Referral to [facility] , you may to follow up and ensure that follow up is scheduled. Phone # [phone] ? ?Please do not take xanax ? ?Increase zoloft to '100mg'$  ( you may take two of the '50mg'$  tablets at night. ? ? ?It is MOST important to drink LOTS of water and follow a HIGH fiber diet to keep foods moving through the gut. You may add Metamucil to a beverage that you drink starting once per day ? ? ?

## 2021-09-22 NOTE — Assessment & Plan Note (Signed)
New.  Advised to stop colace, start metamucil. Will follow ?

## 2021-09-22 NOTE — Assessment & Plan Note (Signed)
Uncontrolled. Increase zoloft to '100mg'$ . Advised against using xanax due to risks of cognitive impairment and advised him not to use as not prescribed to him.May consider cymbalta if zoloft ineffective. Close follow up.  ?

## 2021-09-22 NOTE — Assessment & Plan Note (Signed)
Off of gabapentin and no further episodes. Awaiting second opinion with [facility] as referred by Dr Jaynee Eagles ?

## 2021-09-28 ENCOUNTER — Ambulatory Visit
Admission: RE | Admit: 2021-09-28 | Discharge: 2021-09-28 | Disposition: A | Discharge status: Home / Self Care | Payer: Medicare PPO | Source: Ambulatory Visit | Attending: Family | Admitting: Family

## 2021-09-28 DIAGNOSIS — R918 Other nonspecific abnormal finding of lung field: Secondary | ICD-10-CM | POA: Diagnosis not present

## 2021-09-28 DIAGNOSIS — I700 Atherosclerosis of aorta: Secondary | ICD-10-CM | POA: Diagnosis not present

## 2021-09-28 DIAGNOSIS — J181 Lobar pneumonia, unspecified organism: Secondary | ICD-10-CM | POA: Diagnosis not present

## 2021-09-28 DIAGNOSIS — J90 Pleural effusion, not elsewhere classified: Secondary | ICD-10-CM | POA: Diagnosis not present

## 2021-10-04 DIAGNOSIS — E1042 Type 1 diabetes mellitus with diabetic polyneuropathy: Secondary | ICD-10-CM | POA: Diagnosis not present

## 2021-10-08 ENCOUNTER — Telehealth: Payer: Self-pay | Admitting: Family

## 2021-10-08 ENCOUNTER — Encounter: Payer: Self-pay | Admitting: Family

## 2021-10-08 DIAGNOSIS — R911 Solitary pulmonary nodule: Secondary | ICD-10-CM

## 2021-10-08 NOTE — Telephone Encounter (Signed)
-----   Message from Algernon Huxley, MD sent at 10/08/2021 12:22 PM EDT ----- ?Yes lets get a renal artery duplex in our office to evaluate further.  Thank you ?----- Message ----- ?From: Burnard Hawthorne, FNP ?Sent: 10/08/2021  12:20 PM EDT ?To: Algernon Huxley, MD ? ?Dr Lucky Cowboy,  ? ?Sorry to bother you.  ? 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  ?He is compliant with Lipitor 80 mg. ? ?Does this warrant further evaluation by your team?  ? ?I have rarely seen this finding and want to ensure not deserving of further evaluation other than lipid, HTN management.  ? ?Thanks so much! ? ?Joycelyn Schmid ? ? ?

## 2021-10-08 NOTE — Telephone Encounter (Signed)
Noted ?Sent mychart note to patient ?

## 2021-10-08 NOTE — Telephone Encounter (Signed)
close

## 2021-10-13 ENCOUNTER — Telehealth: Payer: Self-pay | Admitting: Oncology

## 2021-10-13 NOTE — Telephone Encounter (Signed)
Pt wife called req to cancel appts, would not like to r/s at this time...KJ  ?

## 2021-10-18 ENCOUNTER — Other Ambulatory Visit: Payer: Medicare PPO

## 2021-10-20 ENCOUNTER — Telehealth: Payer: Medicare PPO | Admitting: Oncology

## 2021-10-21 ENCOUNTER — Other Ambulatory Visit (INDEPENDENT_AMBULATORY_CARE_PROVIDER_SITE_OTHER): Payer: Self-pay | Admitting: Vascular Surgery

## 2021-10-21 DIAGNOSIS — I701 Atherosclerosis of renal artery: Secondary | ICD-10-CM

## 2021-10-22 ENCOUNTER — Other Ambulatory Visit: Payer: Self-pay | Admitting: Family

## 2021-10-22 ENCOUNTER — Ambulatory Visit (INDEPENDENT_AMBULATORY_CARE_PROVIDER_SITE_OTHER): Payer: Medicare PPO

## 2021-10-22 ENCOUNTER — Ambulatory Visit (INDEPENDENT_AMBULATORY_CARE_PROVIDER_SITE_OTHER): Payer: Medicare PPO | Admitting: Vascular Surgery

## 2021-10-22 VITALS — BP 127/72 | HR 105 | Resp 17 | Ht 67.0 in | Wt 173.0 lb

## 2021-10-22 DIAGNOSIS — I10 Essential (primary) hypertension: Secondary | ICD-10-CM

## 2021-10-22 DIAGNOSIS — E785 Hyperlipidemia, unspecified: Secondary | ICD-10-CM

## 2021-10-22 DIAGNOSIS — E118 Type 2 diabetes mellitus with unspecified complications: Secondary | ICD-10-CM | POA: Diagnosis not present

## 2021-10-22 DIAGNOSIS — I700 Atherosclerosis of aorta: Secondary | ICD-10-CM | POA: Diagnosis not present

## 2021-10-22 DIAGNOSIS — E1069 Type 1 diabetes mellitus with other specified complication: Secondary | ICD-10-CM

## 2021-10-22 DIAGNOSIS — F419 Anxiety disorder, unspecified: Secondary | ICD-10-CM

## 2021-10-22 DIAGNOSIS — I701 Atherosclerosis of renal artery: Secondary | ICD-10-CM

## 2021-10-22 NOTE — Progress Notes (Signed)
Patient ID: Todd Clay, male   DOB: Feb 18, 1946, 76 y.o.   MRN: [id number]  Chief Complaint  Patient presents with   Establish Care    Referred by Dr Vidal Schwalbe    HPI Todd Clay is a 76 y.o. male.  I am asked to see the patient by Mable Paris for evaluation of aortic calcification and significant renal artery calcification seen on a recent CT scan of the chest.  This was an uninfused CT scan of the chest which I have independently reviewed and does show significant aortic and branch vessel calcification.  He does report issues with significant hypertension starting a year or so ago which was difficult to control but under changes in his medical regimen they have gotten this under much better control.  No kidney dysfunction to his knowledge.  No disabling claudication symptoms, rest pain, or ulceration. Renal artery duplex was performed today demonstrating fairly normal kidney size of over 10 cm bilaterally with fairly normal renal artery velocities on each side but a little mildly elevated on the left.  This would not demonstrate any greater than 60% renal artery stenosis.   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   CORONARY ARTERY BYPASS GRAFT  1998   x1 vessel @ [facility]   RETINAL DETACHMENT SURGERY     Laser   STENTS     x 5    VITREOUS RETINAL SURGERY   1985   Laser tx [facility]     Family History  Problem Relation Age of Onset   Kidney disease Mother        Kidney failure   Heart disease Mother    Heart disease Father        CAD   Cancer Brother        Head & Neck Tumor   Alcohol abuse Brother    Diabetes Maternal Aunt    Diabetes Maternal Uncle    Diabetes Maternal Grandmother    Cancer Maternal Grandmother    Diabetes Maternal Grandfather    Syncope episode Neg Hx       Social History   Tobacco Use   Smoking status: Never   Smokeless tobacco: Never  Vaping Use   Vaping Use: Never used  Substance Use Topics   Alcohol use: No   Drug use: No     No Known Allergies  Current Outpatient Medications  Medication Sig Dispense Refill   aspirin EC 81 MG tablet Take 81 mg by mouth daily. Swallow whole.     atorvastatin (LIPITOR) 80 MG tablet Take 80 mg by mouth daily.     clopidogrel (PLAVIX) 75 MG tablet Take 1 tablet by mouth once daily 90 tablet 0  Continuous Blood Gluc Receiver (FREESTYLE LIBRE 14 DAY READER) DEVI USE TO TEST BLOOD SUGAR     furosemide (LASIX) 20 MG tablet Take 1 tablet (20 mg total) by mouth daily as needed. 90 tablet 0   insulin regular (NOVOLIN R RELION) 100 units/mL injection Inject 0.06 mLs (6 Units total) into the skin 3 (three) times daily before meals. (Patient taking differently: Inject 4-6 Units into the skin 3 (three) times daily before meals. Sliding Scale) 10 mL 2   losartan (COZAAR) 25 MG tablet Take 0.5 tablets (12.5 mg total) by mouth daily. 90 tablet 1   sertraline (ZOLOFT) 50 MG tablet Take 2 tablets (100 mg total) by mouth at bedtime. 90 tablet 0   TRESIBA FLEXTOUCH 100 UNIT/ML SOPN FlexTouch Pen Inject 12 Units into the skin daily.  3   No current facility-administered medications for this visit.      REVIEW OF SYSTEMS (Negative unless checked)  Constitutional: '[]'$ Weight loss  '[]'$ Fever  '[]'$ Chills Cardiac: '[]'$ Chest pain   '[]'$ Chest pressure   '[]'$ Palpitations   '[]'$ Shortness of breath when  laying flat   '[]'$ Shortness of breath at rest   '[x]'$ Shortness of breath with exertion. Vascular:  '[]'$ Pain in legs with walking   '[]'$ Pain in legs at rest   '[]'$ Pain in legs when laying flat   '[]'$ Claudication   '[]'$ Pain in feet when walking  '[]'$ Pain in feet at rest  '[]'$ Pain in feet when laying flat   '[]'$ History of DVT   '[]'$ Phlebitis   '[]'$ Swelling in legs   '[]'$ Varicose veins   '[]'$ Non-healing ulcers Pulmonary:   '[]'$ Uses home oxygen   '[]'$ Productive cough   '[]'$ Hemoptysis   '[]'$ Wheeze  '[]'$ COPD   '[]'$ Asthma Neurologic:  '[]'$ Dizziness  '[]'$ Blackouts   '[]'$ Seizures   '[]'$ History of stroke   '[]'$ History of TIA  '[]'$ Aphasia   '[]'$ Temporary blindness   '[]'$ Dysphagia   '[]'$ Weakness or numbness in arms   '[]'$ Weakness or numbness in legs Musculoskeletal:  '[x]'$ Arthritis   '[]'$ Joint swelling   '[x]'$ Joint pain   '[]'$ Low back pain Hematologic:  '[]'$ Easy bruising  '[]'$ Easy bleeding   '[]'$ Hypercoagulable state   '[]'$ Anemic  '[]'$ Hepatitis Gastrointestinal:  '[]'$ Blood in stool   '[]'$ Vomiting blood  '[x]'$ Gastroesophageal reflux/heartburn   '[]'$ Abdominal pain Genitourinary:  '[]'$ Chronic kidney disease   '[]'$ Difficult urination  '[]'$ Frequent urination  '[]'$ Burning with urination   '[]'$ Hematuria Skin:  '[]'$ Rashes   '[]'$ Ulcers   '[]'$ Wounds Psychological:  '[]'$ History of anxiety   '[x]'$  History of major depression.    Physical Exam BP 127/72 (BP Location: Right Arm)   Pulse (!) 105   Resp 17   Ht '5\' 7"'$  (1.702 m)   Wt 173 lb (78.5 kg)   BMI 27.10 kg/m  Gen:  WD/WN, NAD.  Appears younger than stated age Head: [place]/AT, No temporalis wasting.  Ear/Nose/Throat: Hearing grossly intact, nares w/o erythema or drainage, oropharynx w/o Erythema/Exudate Eyes: Conjunctiva clear, sclera non-icteric  Neck: trachea midline.  No JVD.  Pulmonary:  Good air movement, respirations not labored, no use of accessory muscles  Cardiac: RRR, no JVD Vascular:  Vessel Right Left  Radial Palpable Palpable                          DP 1+ 1+  PT 1+ 1+   Gastrointestinal:. No masses, surgical incisions, or  scars. Musculoskeletal: M/S 5/5 throughout.  Extremities without ischemic changes.  No deformity or atrophy.  Trace lower extremity edema. Neurologic: Sensation grossly intact in extremities.  Symmetrical.  Speech is fluent. Motor exam  as listed above. Psychiatric: Judgment intact, Mood & affect appropriate for pt's clinical situation. Dermatologic: No rashes or ulcers noted.  No cellulitis or open wounds.    Radiology CT Chest Wo Contrast  Result Date: 09/29/2021 CLINICAL DATA:  Follow-up for lung nodule. EXAM: CT CHEST WITHOUT CONTRAST TECHNIQUE: Multidetector CT imaging of the chest was performed following the standard protocol without IV contrast. RADIATION DOSE REDUCTION: This exam was performed according to the departmental dose-optimization program which includes automated exposure control, adjustment of the mA and/or kV according to patient size and/or use of iterative reconstruction technique. COMPARISON:  CT without contrast 06/21/2021. FINDINGS: Cardiovascular: The cardiac size is normal. There is heavy three-vessel calcific CAD and surgical changes of an old CABG. There are scattered aortic and branch vessel atherosclerosis without aneurysm. The pulmonary arteries are normal in caliber. There is no pericardial effusion, no venous dilatation. Mediastinum/Nodes: There are stable shotty scattered subcentimeter in short axis mediastinal lymph nodes. There are no enlarged intrathoracic nodes seen by strict short axis size criteria with limited evaluation for hilar adenopathy due to lack of IV contrast. Axillary spaces are clear. The thyroid is unremarkable. The trachea is patent. There is mild circumferential thickening of the thoracic esophagus which was seen previously. Lungs/Pleura: There is a minimal pleural effusion in the left posterior base which may be loculated. This is unchanged. The lungs again demonstrate scattered calcified granulomas. There are few additional tiny noncalcified nodules  which were noted previously and unchanged, likely noncalcified granulomas, for example a 2-3 mm such nodule is present in the right middle lobe on 3:81 and additional nodules measuring 2 mm noted with two of these on 3:108. Pleural thickening and calcifications are again noted posterior lower left chest associated with a stable 4.0 x 1.6 cm left posterior basal consolidation with coarse atelectatic bands extending into it from above. Mild bronchial thickening is also again seen in the left lower lobe infrahilar area with mild left lower lobe volume loss associated with the above findings. The remaining lungs clear with no new abnormality, no right pleural fluid or thickening. Upper Abdomen: There is cholelithiasis without evidence of cholecystitis. There are renovascular calcifications at both renal hila, moderate to heavy calcifications in the aorta, SMA and renal arteries, splenic artery. No acute abnormality. Musculoskeletal: There is multilevel degenerative disc disease and endplate Schmorl's nodes in the thoracic spine with slight dextroscoliosis, mild spondylosis. Moderate bilateral glenohumeral DJD. IMPRESSION: 1. Persistent consolidation in the posterior basal left lower lobe with adjacent pleural thickening and calcifications and a minimally small, likely loculated posterior basal left pleural effusion just below this level. Findings again are suggestive of scarring or rounded atelectasis. Given the size of the abnormality however, follow-up study is recommended in 3 months with PET-CT, or PET-CT could be obtained at this time. 2. Old granulomatous disease and additional tiny noncalcified stable nodules. 3. Cholelithiasis. 4. Aortic atherosclerosis. 5. Mild circumferential distal esophageal wall thickening most likely related to reflux. Please correlate clinically. Electronically Signed   By: Telford Nab M.D.   On: 09/29/2021 00:28    Labs Recent Results (from the past 2160 hour(s))  CBG monitoring,  ED     Status: Abnormal   Collection Time: 08/18/21  9:42 AM  Result Value Ref Range   Glucose-Capillary 234 (H) 70 - 99 mg/dL    Comment: Glucose reference range applies only to samples taken after fasting for at least 8 hours.  Ethanol     Status: None   Collection Time: 08/18/21  9:46 AM  Result Value Ref Range   Alcohol, Ethyl (B) <10 <10 mg/dL    Comment: (NOTE) Lowest detectable limit for serum alcohol is 10 mg/dL.  For medical purposes only. Performed at [facility], [address] [place], [place] [address]   Hemoglobin A1c     Status: Abnormal   Collection Time: 08/18/21  9:46 AM  Result Value Ref Range   Hgb A1c MFr Bld 7.0 (H) 4.8 - 5.6 %    Comment: (NOTE)         Prediabetes: 5.7 - 6.4         Diabetes: >6.4         Glycemic control for adults with diabetes: <7.0    Mean Plasma Glucose 154 mg/dL    Comment: (NOTE) Performed At: [facility] [place], [place] [id number] Rush Farmer MD GY:[id number]   Folate     Status: None   Collection Time: 08/18/21  9:46 AM  Result Value Ref Range   Folate 43.0 >5.9 ng/mL    Comment: Performed at [facility], [address] [place], [place] [address]  TSH     Status: None   Collection Time: 08/18/21  9:46 AM  Result Value Ref Range   TSH 4.277 0.350 - 4.500 uIU/mL    Comment: Performed by a 3rd Generation assay with a functional sensitivity of <=0.01 uIU/mL. Performed at [facility], [address] [place], [place] [address]   CBC with Differential     Status: None   Collection Time: 08/18/21  9:47 AM  Result Value Ref Range   WBC 8.7 4.0 - 10.5 K/uL   RBC 4.23 4.22 - 5.81 MIL/uL   Hemoglobin 13.8 13.0 - 17.0 g/dL   HCT 39.3 39.0 - 52.0 %   MCV 92.9 80.0 - 100.0 fL   MCH 32.6 26.0 - 34.0 pg   MCHC 35.1 30.0 - 36.0 g/dL   RDW 14.6 11.5 - 15.5 %   Platelets 271 150 - 400 K/uL   nRBC 0.0 0.0 - 0.2 %   Neutrophils Relative % 68 %   Neutro Abs 5.9 1.7 - 7.7  K/uL   Lymphocytes Relative 25 %   Lymphs Abs 2.1 0.7 - 4.0 K/uL   Monocytes Relative 6 %   Monocytes Absolute 0.6 0.1 - 1.0 K/uL   Eosinophils Relative 1 %   Eosinophils Absolute 0.0 0.0 - 0.5 K/uL   Basophils Relative 0 %   Basophils Absolute 0.0 0.0 - 0.1 K/uL   Immature Granulocytes 0 %   Abs Immature Granulocytes 0.03 0.00 - 0.07 K/uL    Comment: Performed at [facility], [address] [place], [place] [address]  Comprehensive metabolic panel     Status: Abnormal   Collection Time: 08/18/21  9:47 AM  Result Value Ref Range   Sodium 137 135 - 145 mmol/L   Potassium 3.8 3.5 - 5.1 mmol/L   Chloride 106 98 - 111 mmol/L   CO2 19 (L) 22 - 32 mmol/L   Glucose, Bld 247 (H) 70 - 99 mg/dL    Comment: Glucose reference range applies only to samples taken after fasting for at least 8 hours.   BUN 36 (H) 8 - 23 mg/dL   Creatinine, Ser 1.44 (H) 0.61 - 1.24 mg/dL   Calcium 8.8 (L) 8.9 - 10.3 mg/dL   Total Protein 6.8 6.5 - 8.1 g/dL   Albumin 3.5 3.5 - 5.0 g/dL   AST 46 (  H) 15 - 41 U/L   ALT 29 0 - 44 U/L   Alkaline Phosphatase 82 38 - 126 U/L   Total Bilirubin 0.9 0.3 - 1.2 mg/dL   GFR, Estimated 51 (L) >60 mL/min    Comment: (NOTE) Calculated using the CKD-EPI Creatinine Equation (2021)    Anion gap 12 5 - 15    Comment: Performed at [facility], [address] [place], [place] [address]  Procalcitonin - Baseline     Status: None   Collection Time: 08/18/21  9:47 AM  Result Value Ref Range   Procalcitonin 0.15 ng/mL    Comment:        Interpretation: PCT (Procalcitonin) <= 0.5 ng/mL: Systemic infection (sepsis) is not likely. Local bacterial infection is possible. (NOTE)       Sepsis PCT Algorithm           Lower Respiratory Tract                                      Infection PCT Algorithm    ----------------------------     ----------------------------         PCT < 0.25 ng/mL                PCT < 0.10 ng/mL          Strongly encourage              Strongly discourage   discontinuation of antibiotics    initiation of antibiotics    ----------------------------     -----------------------------       PCT 0.25 - 0.50 ng/mL            PCT 0.10 - 0.25 ng/mL               OR       >80% decrease in PCT            Discourage initiation of                                            antibiotics      Encourage discontinuation           of antibiotics    ----------------------------     -----------------------------         PCT >= 0.50 ng/mL              PCT 0.26 - 0.50 ng/mL               AND        <80% decrease in PCT             Encourage initiation of                                             antibiotics       Encourage continuation           of antibiotics    ----------------------------     -----------------------------        PCT >= 0.50 ng/mL                  PCT > 0.50 ng/mL  AND         increase in PCT                  Strongly encourage                                      initiation of antibiotics    Strongly encourage escalation           of antibiotics                                     -----------------------------                                           PCT <= 0.25 ng/mL                                                 OR                                        > 80% decrease in PCT                                      Discontinue / Do not initiate                                             antibiotics  Performed at [facility], [address], [place], [place] [address]   Resp Panel by RT-PCR (Flu A&B, Covid) Nasopharyngeal Swab     Status: None   Collection Time: 08/18/21  9:47 AM   Specimen: Nasopharyngeal Swab; Nasopharyngeal(NP) swabs in vial transport medium  Result Value Ref Range   SARS Coronavirus 2 by RT PCR NEGATIVE NEGATIVE    Comment: (NOTE) SARS-CoV-2 target nucleic acids are NOT DETECTED.  The SARS-CoV-2 RNA is generally detectable in upper respiratory specimens during  the acute phase of infection. The lowest concentration of SARS-CoV-2 viral copies this assay can detect is 138 copies/mL. A negative result does not preclude SARS-Cov-2 infection and should not be used as the sole basis for treatment or other patient management decisions. A negative result may occur with  improper specimen collection/handling, submission of specimen other than nasopharyngeal swab, presence of viral mutation(s) within the areas targeted by this assay, and inadequate number of viral copies(<138 copies/mL). A negative result must be combined with clinical observations, patient history, and epidemiological information. The expected result is Negative.  Fact Sheet for Patients:  EntrepreneurPulse.com.au  Fact Sheet for Healthcare Providers:  IncredibleEmployment.be  This test is no t yet approved or cleared by the [place] FDA and  has been authorized for detection and/or diagnosis of SARS-CoV-2 by FDA under an Emergency Use Authorization (EUA). This EUA will remain  in effect (meaning this test can be used)  for the duration of the COVID-19 declaration under Section 564(b)(1) of the Act, 21 U.S.C.section 360bbb-3(b)(1), unless the authorization is terminated  or revoked sooner.       Influenza A by PCR NEGATIVE NEGATIVE   Influenza B by PCR NEGATIVE NEGATIVE    Comment: (NOTE) The Xpert Xpress SARS-CoV-2/FLU/RSV plus assay is intended as an aid in the diagnosis of influenza from Nasopharyngeal swab specimens and should not be used as a sole basis for treatment. Nasal washings and aspirates are unacceptable for Xpert Xpress SARS-CoV-2/FLU/RSV testing.  Fact Sheet for Patients: EntrepreneurPulse.com.au  Fact Sheet for Healthcare Providers: IncredibleEmployment.be  This test is not yet approved or cleared by the [place] FDA and has been authorized for detection and/or diagnosis of  SARS-CoV-2 by FDA under an Emergency Use Authorization (EUA). This EUA will remain in effect (meaning this test can be used) for the duration of the COVID-19 declaration under Section 564(b)(1) of the Act, 21 U.S.C. section 360bbb-3(b)(1), unless the authorization is terminated or revoked.  Performed at [facility], [place], [place] [address]   Troponin I (High Sensitivity)     Status: Abnormal   Collection Time: 08/18/21  9:47 AM  Result Value Ref Range   Troponin I (High Sensitivity) 27 (H) <18 ng/L    Comment: (NOTE) Elevated high sensitivity troponin I (hsTnI) values and significant  changes across serial measurements may suggest ACS but many other  chronic and acute conditions are known to elevate hsTnI results.  Refer to the Links section for chest pain algorithms and additional  guidance. Performed at [facility], [place], [place] [address]   Troponin I (High Sensitivity)     Status: Abnormal   Collection Time: 08/18/21 12:08 PM  Result Value Ref Range   Troponin I (High Sensitivity) 19 (H) <18 ng/L    Comment: (NOTE) Elevated high sensitivity troponin I (hsTnI) values and significant  changes across serial measurements may suggest ACS but many other  chronic and acute conditions are known to elevate hsTnI results.  Refer to the "Links" section for chest pain algorithms and additional  guidance. Performed at [facility], [address] [place], [place] [address]   Urinalysis, Routine w reflex microscopic Urine, In & Out Cath     Status: Abnormal   Collection Time: 08/18/21  2:13 PM  Result Value Ref Range   Color, Urine YELLOW (A) YELLOW   APPearance HAZY (A) CLEAR   Specific Gravity, Urine 1.027 1.005 - 1.030   pH 5.0 5.0 - 8.0   Glucose, UA NEGATIVE NEGATIVE mg/dL   Hgb urine dipstick MODERATE (A) NEGATIVE   Bilirubin Urine NEGATIVE NEGATIVE   Ketones, ur 20 (A) NEGATIVE mg/dL   Protein, ur 100  (A) NEGATIVE mg/dL   Nitrite NEGATIVE NEGATIVE   Leukocytes,Ua NEGATIVE NEGATIVE   RBC / HPF 6-10 0 - 5 RBC/hpf   WBC, UA 0-5 0 - 5 WBC/hpf   Bacteria, UA NONE SEEN NONE SEEN   Squamous Epithelial / LPF NONE SEEN 0 - 5   Mucus PRESENT    Hyaline Casts, UA PRESENT     Comment: Performed at [facility], [address], [place], [place] [address]  Urine Drug Screen, Qualitative     Status: None   Collection Time: 08/18/21  2:13 PM  Result Value Ref Range   Tricyclic, Ur Screen NONE DETECTED NONE DETECTED   Amphetamines, Ur Screen NONE DETECTED NONE DETECTED   MDMA (Ecstasy)Ur  Screen NONE DETECTED NONE DETECTED   Cocaine Metabolite,Ur [place] NONE DETECTED NONE DETECTED   Opiate, Ur Screen NONE DETECTED NONE DETECTED   Phencyclidine (PCP) Ur S NONE DETECTED NONE DETECTED   Cannabinoid 50 Ng, Ur [place] NONE DETECTED NONE DETECTED   Barbiturates, Ur Screen NONE DETECTED NONE DETECTED   Benzodiazepine, Ur Scrn NONE DETECTED NONE DETECTED   Methadone Scn, Ur NONE DETECTED NONE DETECTED    Comment: (NOTE) Tricyclics + metabolites, urine    Cutoff 1000 ng/mL Amphetamines + metabolites, urine  Cutoff 1000 ng/mL MDMA (Ecstasy), urine              Cutoff 500 ng/mL Cocaine Metabolite, urine          Cutoff 300 ng/mL Opiate + metabolites, urine        Cutoff 300 ng/mL Phencyclidine (PCP), urine         Cutoff 25 ng/mL Cannabinoid, urine                 Cutoff 50 ng/mL Barbiturates + metabolites, urine  Cutoff 200 ng/mL Benzodiazepine, urine              Cutoff 200 ng/mL Methadone, urine                   Cutoff 300 ng/mL  The urine drug screen provides only a preliminary, unconfirmed analytical test result and should not be used for non-medical purposes. Clinical consideration and professional judgment should be applied to any positive drug screen result due to possible interfering substances. A more specific alternate chemical method must be used in order to obtain a confirmed analytical  result. Gas chromatography / mass spectrometry (GC/MS) is the preferred confirm atory method. Performed at [facility], [address] [place], [place] [address]   Glucose, capillary     Status: Abnormal   Collection Time: 08/18/21  4:27 PM  Result Value Ref Range   Glucose-Capillary 123 (H) 70 - 99 mg/dL    Comment: Glucose reference range applies only to samples taken after fasting for at least 8 hours.  Ammonia     Status: None   Collection Time: 08/18/21  4:38 PM  Result Value Ref Range   Ammonia <10 9 - 35 umol/L    Comment: Performed at [facility], [address] [place], [place] [address]  Vitamin B12     Status: None   Collection Time: 08/18/21  4:38 PM  Result Value Ref Range   Vitamin B-12 409 180 - 914 pg/mL    Comment: (NOTE) This assay is not validated for testing neonatal or myeloproliferative syndrome specimens for Vitamin B12 levels. Performed at [facility], [place] [address] [place], [place] [address]   RPR     Status: None   Collection Time: 08/18/21  4:38 PM  Result Value Ref Range   RPR Ser Ql NON REACTIVE NON REACTIVE    Comment: Performed at [facility], [place] [address], [place], [place] [address]  Glucose, capillary     Status: Abnormal   Collection Time: 08/18/21  8:04 PM  Result Value Ref Range   Glucose-Capillary 132 (H) 70 - 99 mg/dL    Comment: Glucose reference range applies only to samples taken after fasting for at least 8 hours.  Glucose, capillary     Status: Abnormal   Collection Time: 08/19/21 12:17 AM  Result Value Ref Range   Glucose-Capillary 135 (H) 70 - 99 mg/dL  Comment: Glucose reference range applies only to samples taken after fasting for at least 8 hours.  HIV Antibody (routine testing w rflx)     Status: None   Collection Time: 08/19/21  6:03 AM  Result Value Ref Range   HIV Screen 4th Generation wRfx Non Reactive Non Reactive    Comment: Performed at [facility], [address] [place], [place] [address]  Brain natriuretic peptide     Status: None   Collection Time: 08/19/21  6:03 AM  Result Value Ref Range   B Natriuretic Peptide 67.0 0.0 - 100.0 pg/mL    Comment: Performed at [facility], [address] [place], [place] [address]  Glucose, capillary     Status: Abnormal   Collection Time: 08/19/21  7:21 AM  Result Value Ref Range   Glucose-Capillary 165 (H) 70 - 99 mg/dL    Comment: Glucose reference range applies only to samples taken after fasting for at least 8 hours.  Glucose, capillary     Status: Abnormal   Collection Time: 08/19/21 11:56 AM  Result Value Ref Range   Glucose-Capillary 144 (H) 70 - 99 mg/dL    Comment: Glucose reference range applies only to samples taken after fasting for at least 8 hours.  Glucose, capillary     Status: Abnormal   Collection Time: 08/19/21  3:45 PM  Result Value Ref Range   Glucose-Capillary 148 (H) 70 - 99 mg/dL    Comment: Glucose reference range applies only to samples taken after fasting for at least 8 hours.    Assessment/Plan:  Aortic atherosclerosis ([place]) I have independently reviewed his recent CT scan of the chest.  This does include the upper abdomen but was without contrast.  There are significant aortic and branch vessel calcifications although the degree of stenosis could not be reliably interpreted on this study.  No significant claudication or rest pain symptoms of concern at this point.  Continue current medical regimen.  Essential hypertension blood pressure control important in reducing the progression of atherosclerotic disease. On appropriate oral medications.   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 Continue statin therapy   Diabetes mellitus with complication (HCC) blood glucose control important in reducing the progression of atherosclerotic disease. Also, involved in wound healing. On  appropriate medications.   Atherosclerosis of renal artery (HCC) Renal artery duplex was performed today demonstrating fairly normal kidney size of over 10 cm bilaterally with fairly normal renal artery velocities on each side but a little mildly elevated on the left.  This would not demonstrate any greater than 60% renal artery stenosis.  From his CT scan he has significant calcification, but no hemodynamically significant stenosis of the renal arteries.  No role for intervention or further invasive evaluation at this time.  As long as his blood pressure control and renal function remained stable, we will check this again in 1 year.      Leotis Pain 10/22/2021, 9:46 AM   This note was created with Dragon medical transcription system.  Any errors from dictation are unintentional.

## 2021-10-22 NOTE — Assessment & Plan Note (Signed)
blood pressure control important in reducing the progression of atherosclerotic disease. On appropriate oral medications.  

## 2021-10-22 NOTE — Assessment & Plan Note (Signed)
lipid control important in reducing the progression of atherosclerotic disease. Continue statin therapy  

## 2021-10-22 NOTE — Assessment & Plan Note (Signed)
Renal artery duplex was performed today demonstrating fairly normal kidney size of over 10 cm bilaterally with fairly normal renal artery velocities on each side but a little mildly elevated on the left.  This would not demonstrate any greater than 60% renal artery stenosis.  From his CT scan he has significant calcification, but no hemodynamically significant stenosis of the renal arteries.  No role for intervention or further invasive evaluation at this time.  As long as his blood pressure control and renal function remained stable, we will check this again in 1 year.

## 2021-10-22 NOTE — Assessment & Plan Note (Signed)
blood glucose control important in reducing the progression of atherosclerotic disease. Also, involved in wound healing. On appropriate medications.  

## 2021-10-22 NOTE — Assessment & Plan Note (Addendum)
I have independently reviewed his recent CT scan of the chest.  This does include the upper abdomen but was without contrast.  There are significant aortic and branch vessel calcifications although the degree of stenosis could not be reliably interpreted on this study.  No significant claudication or rest pain symptoms of concern at this point.  Continue current medical regimen.

## 2021-10-27 ENCOUNTER — Telehealth: Payer: Self-pay | Admitting: Family

## 2021-10-27 ENCOUNTER — Ambulatory Visit: Payer: Medicare PPO | Admitting: Family

## 2021-10-27 VITALS — BP 136/86 | HR 65 | Temp 98.0°F | Ht 63.0 in | Wt 175.4 lb

## 2021-10-27 DIAGNOSIS — F419 Anxiety disorder, unspecified: Secondary | ICD-10-CM

## 2021-10-27 DIAGNOSIS — I25118 Atherosclerotic heart disease of native coronary artery with other forms of angina pectoris: Secondary | ICD-10-CM | POA: Diagnosis not present

## 2021-10-27 DIAGNOSIS — E1142 Type 2 diabetes mellitus with diabetic polyneuropathy: Secondary | ICD-10-CM

## 2021-10-27 DIAGNOSIS — R634 Abnormal weight loss: Secondary | ICD-10-CM | POA: Diagnosis not present

## 2021-10-27 DIAGNOSIS — I951 Orthostatic hypotension: Secondary | ICD-10-CM | POA: Diagnosis not present

## 2021-10-27 LAB — POCT GLYCOSYLATED HEMOGLOBIN (HGB A1C): Hemoglobin A1C: 7 % — AB (ref 4.0–5.6)

## 2021-10-27 MED ORDER — MIRTAZAPINE 7.5 MG PO TABS
7.5000 mg | ORAL_TABLET | Freq: Every day | ORAL | 0 refills | Status: DC
Start: 2021-10-27 — End: 2022-01-10

## 2021-10-27 MED ORDER — GABAPENTIN 100 MG PO CAPS
100.0000 mg | ORAL_CAPSULE | Freq: Every day | ORAL | 3 refills | Status: DC
Start: 2021-10-27 — End: 2023-05-04

## 2021-10-27 MED ORDER — SERTRALINE HCL 100 MG PO TABS: 100.0000 mg | ORAL_TABLET | Freq: Every day | ORAL | 3 refills | Status: DC

## 2021-10-27 MED ORDER — ATORVASTATIN CALCIUM 80 MG PO TABS: 80.0000 mg | ORAL_TABLET | Freq: Every day | ORAL | 1 refills | Status: DC

## 2021-10-27 NOTE — Patient Instructions (Addendum)
Start remeron for sleep.   Due to dizziness, weight loss, you may trial taking losartan 12.'5mg'$  every other day   Let me know how you are doing  Please keep upcoming follow-up with Endocrine

## 2021-10-27 NOTE — Assessment & Plan Note (Addendum)
Poor appetite, apathy. Concerned symptoms of depression.  We had long discussion that changes in appetite could certainly be related to underlying malignancy including colon cancer.  I made this referral in past for him to have colonoscopy.  He declines having any screening including Cologuard or colonoscopy for colon cancer.  We will start Remeron 7.5 mg to increase appetite, augment Zoloft '100mg'$ . Close follow up.

## 2021-10-27 NOTE — Progress Notes (Signed)
Subjective:    Patient ID: Todd Clay, male    DOB: 17-Nov-1945, 76 y.o.   MRN: [id number]  CC: Todd Clay is a 76 y.o. male who presents today for follow up.   HPI: Accompanied by wife He continues to complaint of chronic dizziness. No syncopal episodes. No confusion. Dizziness worse with position changes. No associated CP, sob, orthopnea, leg swelling He is compliant with losartan 12.'5mg'$ .  He takes lasix '20mg'$  prn.      Echocardiogram 02/05/2021 LVEF 50-55%.   Compliant with Zoloft 100 mg.He has trouble falling asleep. He doesn't want to get out to do anything. He is tired of taking medicine.  No si/hi He eats breakfast but doesn't desire lunch and dinner.  No fever, abdominal pain.  Due for colon cancer screening Never had colonoscopy  He is has resumed gabapentin '100mg'$  qd.  Awaking appointment with [facility] neurology movement disorder clinic   upcoming appointment with Dr. Patsey Berthold regarding pulmonary nodules, abnormal CT chest.  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   CORONARY ARTERY BYPASS GRAFT  1998   x1 vessel @ [facility]   RETINAL DETACHMENT SURGERY     Laser   STENTS     x 5    VITREOUS RETINAL SURGERY  1985   Laser tx [facility]   Family History  Problem Relation Age of Onset   Kidney disease Mother        Kidney failure    Heart disease Mother    Heart disease Father        CAD   Cancer Brother        Head & Neck Tumor   Alcohol abuse Brother    Diabetes Maternal Aunt    Diabetes Maternal Uncle    Diabetes Maternal Grandmother    Cancer Maternal Grandmother    Diabetes Maternal Grandfather    Syncope episode Neg Hx     Allergies: Patient has no known allergies. Current Outpatient Medications on File Prior to Visit  Medication Sig Dispense Refill   clopidogrel (PLAVIX) 75 MG tablet Take 1 tablet by mouth once daily 90 tablet 0   Continuous Blood Gluc Receiver (FREESTYLE LIBRE 14 DAY READER) DEVI USE TO TEST BLOOD SUGAR     furosemide (LASIX) 20 MG tablet Take 1 tablet (20 mg total) by mouth daily as needed. 90 tablet 0   losartan (COZAAR) 25 MG tablet Take 0.5 tablets (12.5 mg total) by mouth daily. 90 tablet 1   TRESIBA FLEXTOUCH 100 UNIT/ML SOPN FlexTouch Pen Inject 12 Units into the skin daily.  3   aspirin EC 81 MG tablet Take 81 mg by mouth daily. Swallow whole.     insulin  regular (NOVOLIN R RELION) 100 units/mL injection Inject 0.06 mLs (6 Units total) into the skin 3 (three) times daily before meals. (Patient taking differently: Inject 4-6 Units into the skin 3 (three) times daily before meals. Sliding Scale) 10 mL 2   [DISCONTINUED] carvedilol (COREG) 6.25 MG tablet Take 6.25 mg by mouth 2 (two) times daily.     No current facility-administered medications on file prior to visit.    Social History   Tobacco Use   Smoking status: Never   Smokeless tobacco: Never  Vaping Use   Vaping Use: Never used  Substance Use Topics   Alcohol use: No   Drug use: No    Review of Systems  Constitutional:  Positive for appetite change and unexpected weight change. Negative for chills and fever.  Respiratory:  Negative for cough.   Cardiovascular:  Negative for chest pain and palpitations.  Gastrointestinal:  Negative for nausea and vomiting.  Neurological:  Positive for dizziness. Negative for  syncope.     Objective:    BP 136/86 (BP Location: Right Arm, Patient Position: Sitting, Cuff Size: Normal)   Pulse 65   Temp 98 F (36.7 C) (Oral)   Ht '5\' 3"'$  (1.6 m)   Wt 175 lb 6.4 oz (79.6 kg)   SpO2 94%   BMI 31.07 kg/m  BP Readings from Last 3 Encounters:  10/27/21 136/86  10/22/21 127/72  08/19/21 123/60   Wt Readings from Last 3 Encounters:  10/27/21 175 lb 6.4 oz (79.6 kg)  10/22/21 173 lb (78.5 kg)  08/18/21 192 lb 0.3 oz (87.1 kg)   Orthostatic VS for the past 24 hrs (Last 3 readings):  BP- Sitting Pulse- Sitting BP- Standing at 0 minutes Pulse- Standing at 0 minutes  10/27/21 1250 116/69 62 112/68 97     Orthostatic VS for the past 24 hrs (Last 3 readings):  BP- Sitting Pulse- Sitting BP- Standing at 0 minutes Pulse- Standing at 0 minutes  10/27/21 1250 116/69 62 112/68 97    Physical Exam Vitals reviewed.  Constitutional:      Appearance: He is well-developed.  Cardiovascular:     Rate and Rhythm: Regular rhythm.     Heart sounds: Normal heart sounds.     Comments: No LE edema, palpable cords or masses. No erythema or increased warmth. No asymmetry in calf size when compared bilaterally LE hair growth symmetric and present. No discoloration or varicosities noted. LE warm  Pulmonary:     Effort: Pulmonary effort is normal. No respiratory distress.     Breath sounds: Normal breath sounds. No wheezing, rhonchi or rales.  Musculoskeletal:     Right lower leg: No edema.     Left lower leg: No edema.  Skin:    General: Skin is warm and dry.  Neurological:     Mental Status: He is alert.  Psychiatric:        Speech: Speech normal.        Behavior: Behavior normal.       Assessment & Plan:   Problem List Items Addressed This Visit       Cardiovascular and Mediastinum   CAD (coronary artery disease) - Primary   Relevant Medications   atorvastatin (LIPITOR) 80 MG tablet   Orthostatic hypotension    Orthostatic hypotension was not demonstrated  today.  Advised since he is symptomatic and that he may take losartan 12.5 mg every other day to see if dizziness improves while monitoring BP.  Suspect weight loss may  be contributing to lower blood pressure as well. Call out to [facility] regarding when his appointment can be scheduled as referred by Dr Jaynee Eagles.  Close follow-up       Relevant Medications   atorvastatin (LIPITOR) 80 MG tablet     Endocrine   Type 2 diabetes mellitus, without long-term current use of insulin (HCC)   Relevant Medications   atorvastatin (LIPITOR) 80 MG tablet   gabapentin (NEURONTIN) 100 MG capsule   Other Relevant Orders   Comprehensive metabolic panel   POCT HgB A1C (Completed)     Other   Weight loss    Poor appetite, apathy. Concerned symptoms of depression.  We had long discussion that changes in appetite could certainly be related to underlying malignancy including colon cancer.  I made this referral in past for him to have colonoscopy.  He declines having any screening including Cologuard or colonoscopy for colon cancer.  We will start Remeron 7.5 mg to increase appetite, augment Zoloft '100mg'$ . Close follow up.         Relevant Medications   mirtazapine (REMERON) 7.5 MG tablet   Other Visit Diagnoses     Anxiety       Relevant Medications   mirtazapine (REMERON) 7.5 MG tablet   sertraline (ZOLOFT) 100 MG tablet        I have discontinued Christiano L. Ganesh's sertraline. I have also changed his atorvastatin. Additionally, I am having him start on mirtazapine, gabapentin, and sertraline. Lastly, I am having him maintain his insulin regular, Tresiba FlexTouch, FreeStyle Libre 14 Day Reader, aspirin EC, furosemide, losartan, and clopidogrel.   Meds ordered this encounter  Medications   atorvastatin (LIPITOR) 80 MG tablet    Sig: Take 1 tablet (80 mg total) by mouth daily.    Dispense:  90 tablet    Refill:  1    Order Specific Question:   Supervising  Provider    Answer:   Deborra Medina L [[id number]]   mirtazapine (REMERON) 7.5 MG tablet    Sig: Take 1 tablet (7.5 mg total) by mouth at bedtime.    Dispense:  90 tablet    Refill:  0    Order Specific Question:   Supervising Provider    Answer:   Deborra Medina L [[id number]]   gabapentin (NEURONTIN) 100 MG capsule    Sig: Take 1 capsule (100 mg total) by mouth daily.    Dispense:  90 capsule    Refill:  3    Order Specific Question:   Supervising Provider    Answer:   Deborra Medina L [[id number]]   sertraline (ZOLOFT) 100 MG tablet    Sig: Take 1 tablet (100 mg total) by mouth daily.    Dispense:  30 tablet    Refill:  3    Order Specific Question:   Supervising Provider    Answer:   Crecencio Mc [[id number]]    Return precautions given.   Risks, benefits, and alternatives of the medications and treatment plan prescribed today were discussed, and patient expressed understanding.   Education regarding symptom management and diagnosis given to patient on AVS.  Continue to follow with Burnard Hawthorne, FNP for routine health maintenance.   Patience Musca and I agreed with plan.   Mable Paris, FNP

## 2021-10-27 NOTE — Telephone Encounter (Signed)
Call [facility] neurology movement disorder clinic   [place] neurology, Dr. Jaynee Eagles referred patient for second opinion due to movement disorder.  I do not see that they have called patient.  Referral was placed 08/31/21

## 2021-10-27 NOTE — Assessment & Plan Note (Addendum)
Orthostatic hypotension was not demonstrated today.  Advised since he is symptomatic and that he may take losartan 12.5 mg every other day to see if dizziness improves while monitoring BP.  Suspect weight loss may be contributing to lower blood pressure as well. Call out to [facility] regarding when his appointment can be scheduled as referred by Dr Jaynee Eagles.  Close follow-up

## 2021-10-28 NOTE — Telephone Encounter (Signed)
Left message at [facility] at 1:00 pm on 10/28/21 for someone to return my call in regards to referral placed on 08/31/21 for Todd Clay

## 2021-11-02 NOTE — Telephone Encounter (Signed)
LM once again for [facility] to call back on status of referral &/or scheduling.

## 2021-11-03 ENCOUNTER — Telehealth: Payer: Self-pay | Admitting: Family

## 2021-11-03 NOTE — Telephone Encounter (Signed)
Patient declined AWV do not call  

## 2021-11-05 NOTE — Telephone Encounter (Signed)
Tried calling Angelic back from [facility] disorder at the number she left for me.Marland Kitchen.[phone]) did not get an answer . LVM to give me a call back here about Mr [place] referral

## 2021-11-05 NOTE — Telephone Encounter (Signed)
LMTCB to office to inquire about patient referral placed on 08/31/21 and patient still has not heard anything in regards to being able to be seen.

## 2021-11-05 NOTE — Telephone Encounter (Signed)
Dr Jaynee Eagles,  Patient has not heard from [facility] disorder clinic.  I had a good visit with him 10/27/21 . No further altered mental status but continues to complain of dizziness. I furthered decreased blood pressure regimen.   I have asked ( below) my cma to call again regarding appointment. If you have an easier way to reach [facility], let me know. So far our messages have not been returned and patient's wife sent me a mychart regarding this appointment this week. Alternatively, he doesn't have a follow up with you, if you think that would be reasonable too.    [place],  Please call again [facility] disorder clinic per below. Patient has been waiting on referral which was placed 08/31/21.

## 2021-11-05 NOTE — Telephone Encounter (Signed)
Angelic From [facility] Neuro returning call... Angelic requesting callback at [phone]

## 2021-11-09 ENCOUNTER — Telehealth: Payer: Self-pay | Admitting: Family

## 2021-11-09 NOTE — Telephone Encounter (Signed)
Spoke to patient's wife Mrs Cooler and explained to her that I was waiting to hear back from [place] at [facility] neurology about referral sent for Mr. Fredin. As soon as I spoke to someone I would get back in contact with her.

## 2021-11-09 NOTE — Telephone Encounter (Signed)
Patient's wife called and stated she was at the pharmacy and wanted Arnett to call something in for constipation while she was there.Patient was advised that Todd Clay was with patient's and she could ask the pharmacist if there was something over the counter she could get for her husband.

## 2021-11-10 ENCOUNTER — Encounter: Payer: Self-pay | Admitting: Family

## 2021-11-10 ENCOUNTER — Ambulatory Visit (INDEPENDENT_AMBULATORY_CARE_PROVIDER_SITE_OTHER): Payer: Medicare PPO | Admitting: Family

## 2021-11-10 VITALS — BP 118/68 | HR 84 | Temp 98.0°F | Ht 63.0 in | Wt 171.0 lb

## 2021-11-10 DIAGNOSIS — F419 Anxiety disorder, unspecified: Secondary | ICD-10-CM

## 2021-11-10 DIAGNOSIS — S41112A Laceration without foreign body of left upper arm, initial encounter: Secondary | ICD-10-CM

## 2021-11-10 DIAGNOSIS — I10 Essential (primary) hypertension: Secondary | ICD-10-CM | POA: Diagnosis not present

## 2021-11-10 DIAGNOSIS — R634 Abnormal weight loss: Secondary | ICD-10-CM | POA: Diagnosis not present

## 2021-11-10 DIAGNOSIS — R109 Unspecified abdominal pain: Secondary | ICD-10-CM | POA: Diagnosis not present

## 2021-11-10 DIAGNOSIS — F32A Depression, unspecified: Secondary | ICD-10-CM

## 2021-11-10 LAB — CBC WITH DIFFERENTIAL/PLATELET
Basophils Absolute: 0.1 10*3/uL (ref 0.0–0.1)
Basophils Relative: 1.2 % (ref 0.0–3.0)
Eosinophils Absolute: 0.2 10*3/uL (ref 0.0–0.7)
Eosinophils Relative: 2.1 % (ref 0.0–5.0)
HCT: 35 % — ABNORMAL LOW (ref 39.0–52.0)
Hemoglobin: 12.3 g/dL — ABNORMAL LOW (ref 13.0–17.0)
Lymphocytes Relative: 24.5 % (ref 12.0–46.0)
Lymphs Abs: 2.3 10*3/uL (ref 0.7–4.0)
MCHC: 35.1 g/dL (ref 30.0–36.0)
MCV: 99.5 fl (ref 78.0–100.0)
Monocytes Absolute: 0.6 10*3/uL (ref 0.1–1.0)
Monocytes Relative: 6.6 % (ref 3.0–12.0)
Neutro Abs: 6.1 10*3/uL (ref 1.4–7.7)
Neutrophils Relative %: 65.6 % (ref 43.0–77.0)
Platelets: 210 10*3/uL (ref 150.0–400.0)
RBC: 3.52 Mil/uL — ABNORMAL LOW (ref 4.22–5.81)
RDW: 14 % (ref 11.5–15.5)
WBC: 9.2 10*3/uL (ref 4.0–10.5)

## 2021-11-10 LAB — COMPREHENSIVE METABOLIC PANEL
ALT: 20 U/L (ref 0–53)
AST: 29 U/L (ref 0–37)
Albumin: 3.4 g/dL — ABNORMAL LOW (ref 3.5–5.2)
Alkaline Phosphatase: 88 U/L (ref 39–117)
BUN: 21 mg/dL (ref 6–23)
CO2: 26 mEq/L (ref 19–32)
Calcium: 8.9 mg/dL (ref 8.4–10.5)
Chloride: 104 mEq/L (ref 96–112)
Creatinine, Ser: 0.96 mg/dL (ref 0.40–1.50)
GFR: 77.27 mL/min (ref >60.00)
Glucose, Bld: 173 mg/dL — ABNORMAL HIGH (ref 70–99)
Potassium: 4 mEq/L (ref 3.5–5.1)
Sodium: 138 mEq/L (ref 135–145)
Total Bilirubin: 0.5 mg/dL (ref 0.2–1.2)
Total Protein: 6.1 g/dL (ref 6.0–8.3)

## 2021-11-10 LAB — PSA: PSA: 0.91 ng/mL (ref 0.10–4.00)

## 2021-11-10 LAB — TSH: TSH: 2.35 u[IU]/mL (ref 0.35–5.50)

## 2021-11-10 MED ORDER — MUPIROCIN 2 % EX OINT: 1.0000 "application " | TOPICAL_OINTMENT | Freq: Two times a day (BID) | CUTANEOUS | 2 refills | Status: AC

## 2021-11-10 NOTE — Telephone Encounter (Signed)
Spoke to wife on 6/12 about [facility] and explained that I would keep her informed as soon as I heard back from them. Have made several attempts to speak to someone

## 2021-11-10 NOTE — Assessment & Plan Note (Signed)
Sleep has improved.  Continue Remeron 7.5 mg

## 2021-11-10 NOTE — Telephone Encounter (Signed)
Thanks dr Jaynee Eagles  Let me know if you hear anything regarding second opinion at [facility]   Wife and patient expressed interest in follow up with you. Can your office arrange?   FYI - saw him today and more concerned with unintentional weight loss, change in bowels, loss of appetite. Question depression playing a role but expressed concerned that we need to proceed with malignancy work up.  He for a long time has declined colonoscopy.  He was today agreeable to CT a/p and also urgent referral to GI

## 2021-11-10 NOTE — Patient Instructions (Addendum)
Take miralax and colace daily as you are You may continue fleet enema and ducolax as needed as you are.  Let me know how you are doing  We will urgently get you into to gastroenterology with Dr [name].  Let us know if you dont hear back within a week in regards to an appointment being scheduled.

## 2021-11-10 NOTE — Assessment & Plan Note (Signed)
Controlled.  Continue losartan 12.5 mg every other day

## 2021-11-10 NOTE — Addendum Note (Signed)
Addended by: Neta Ehlers on: 11/10/2021 12:24 PM   Modules accepted: Orders

## 2021-11-10 NOTE — Progress Notes (Signed)
Subjective:    Patient ID: Todd Clay, male    DOB: Mar 12, 1946, 76 y.o.   MRN: [id number]  CC: Todd Clay is a 76 y.o. male who presents today for follow up.   HPI: Constipation since he was hospitalized 08/18/21.  He isnt sure what nas changed  He bumped his left arm and has dime sized skin tear. Slight redness around it. No exudate.     Accompanied by wife Wife reports she noticed bright red blood after wiping yesterday.  Patient reports he has a known external hemorrhoid. Denies fever, abdominal pain, nausea, vomiting, trouble swallowing or choking.    He has taken ducolax and fleet enema and had a regular formed bm.  He is taking Colace and MiraLAX daily. He is compliant with losartan 12.5 mg every other day.  He is sleeping better on remeron 7.'5mg'$   He has lost 5lbs in 2 weeks.    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   CORONARY ARTERY BYPASS GRAFT  1998   x1 vessel @ [facility]   RETINAL DETACHMENT SURGERY     Laser   STENTS     x 5    VITREOUS RETINAL SURGERY  1985   Laser tx OU   Family History  Problem Relation Age of Onset   Kidney disease Mother        Kidney failure   Heart disease Mother    Heart disease Father        CAD   Cancer Brother        Head & Neck Tumor   Alcohol abuse Brother    Diabetes Maternal Aunt     Diabetes Maternal Uncle    Diabetes Maternal Grandmother    Cancer Maternal Grandmother    Diabetes Maternal Grandfather    Syncope episode Neg Hx     Allergies: Patient has no known allergies. Current Outpatient Medications on File Prior to Visit  Medication Sig Dispense Refill   aspirin EC 81 MG tablet Take 81 mg by mouth daily. Swallow whole.     atorvastatin (LIPITOR) 80 MG tablet Take 1 tablet (80 mg total) by mouth daily. 90 tablet 1   clopidogrel (PLAVIX) 75 MG tablet Take 1 tablet by mouth once daily 90 tablet 0   Continuous Blood Gluc Receiver (FREESTYLE LIBRE 14 DAY READER) DEVI USE TO TEST BLOOD SUGAR     furosemide (LASIX) 20 MG tablet Take 1 tablet (20 mg total) by mouth daily as needed. 90 tablet 0   gabapentin (NEURONTIN) 100 MG capsule Take 1 capsule (100 mg total) by mouth daily. 90 capsule 3   insulin regular (NOVOLIN R RELION) 100 units/mL injection Inject 0.06 mLs (6  Units total) into the skin 3 (three) times daily before meals. (Patient taking differently: Inject 4-6 Units into the skin 3 (three) times daily before meals. Sliding Scale) 10 mL 2   losartan (COZAAR) 25 MG tablet Take 0.5 tablets (12.5 mg total) by mouth daily. 90 tablet 1   mirtazapine (REMERON) 7.5 MG tablet Take 1 tablet (7.5 mg total) by mouth at bedtime. 90 tablet 0   sertraline (ZOLOFT) 100 MG tablet Take 1 tablet (100 mg total) by mouth daily. 30 tablet 3   TRESIBA FLEXTOUCH 100 UNIT/ML SOPN FlexTouch Pen Inject 12 Units into the skin daily.  3   [DISCONTINUED] carvedilol (COREG) 6.25 MG tablet Take 6.25 mg by mouth 2 (two) times daily.     No current facility-administered medications on file prior to visit.    Social History   Tobacco Use   Smoking status: Never   Smokeless tobacco: Never  Vaping Use   Vaping Use: Never used  Substance Use Topics   Alcohol use: No   Drug use: No    Review of Systems  Constitutional:  Negative for chills and fever.  Respiratory:  Negative for cough.    Cardiovascular:  Negative for chest pain and palpitations.  Gastrointestinal:  Positive for anal bleeding, blood in stool and constipation. Negative for abdominal distention, abdominal pain, nausea and vomiting.  Psychiatric/Behavioral:  Negative for sleep disturbance.       Objective:    BP 118/68 (BP Location: Right Arm, Patient Position: Sitting, Cuff Size: Normal)   Pulse 84   Temp 98 F (36.7 C) (Oral)   Ht '5\' 3"'$  (1.6 m)   Wt 171 lb (77.6 kg)   SpO2 97%   BMI 30.29 kg/m  BP Readings from Last 3 Encounters:  11/10/21 118/68  10/27/21 136/86  10/22/21 127/72   Wt Readings from Last 3 Encounters:  11/10/21 171 lb (77.6 kg)  10/27/21 175 lb 6.4 oz (79.6 kg)  10/22/21 173 lb (78.5 kg)    Physical Exam Vitals reviewed.  Constitutional:      Appearance: Normal appearance. He is well-developed.  Cardiovascular:     Rate and Rhythm: Regular rhythm.     Heart sounds: Normal heart sounds.  Pulmonary:     Effort: Pulmonary effort is normal. No respiratory distress.     Breath sounds: Normal breath sounds. No wheezing or rales.  Abdominal:     General: Bowel sounds are normal. There is no distension.     Palpations: Abdomen is soft. Abdomen is not rigid. There is no fluid wave or mass.     Tenderness: There is no abdominal tenderness. There is no guarding or rebound. Negative signs include Murphy's sign and McBurney's sign.  Skin:    General: Skin is warm and dry.          Comments: Small skin tear.  It is well approximated.  No red streaks, increased warmth or acute  Neurological:     Mental Status: He is alert.  Psychiatric:        Speech: Speech normal.        Behavior: Behavior normal.        Assessment & Plan:   Problem List Items Addressed This Visit       Cardiovascular and Mediastinum   Essential hypertension    Controlled.  Continue losartan 12.5 mg every other day        Musculoskeletal and Integument   Skin tear of left upper arm without  complication  Other   Anxiety and depression    Sleep has improved.  Continue Remeron 7.5 mg      Unintentional weight loss - Primary    Continued unintentional weight loss. One year ago, weight 191. Today 171.  Benign abdominal exam today.  Patient declines evaluation for hemorrhoid with rectal exam today.  discussed with patient wife today the importance and urgency of malignancy work-up.  Pending stat CT abdomen and pelvis.  He is agreeable to seeing gastroenterology at this time.  I reiterated the importance of screening colonoscopy specially in the setting of change in bowel habits.  Likely patient would require upper endoscopy due to esophageal dilatation seen on CT chest obtained May 2023. Pending anemia labs today. PSA lab ordered today to evaluate for change which would raise concern for malignancy.       Relevant Medications   mupirocin ointment (BACTROBAN) 2 %   Other Visit Diagnoses     Abdominal pain, unspecified abdominal location       Relevant Orders   CT ABDOMEN PELVIS W CONTRAST        I am having Kin L. Heinemann start on mupirocin ointment. I am also having him maintain his insulin regular, Tresiba FlexTouch, [employer] 14 Day Reader, aspirin EC, furosemide, losartan, clopidogrel, atorvastatin, mirtazapine, gabapentin, and sertraline.   Meds ordered this encounter  Medications   mupirocin ointment (BACTROBAN) 2 %    Sig: Apply 1 application  topically 2 (two) times daily.    Dispense:  22 g    Refill:  2    Order Specific Question:   Supervising Provider    Answer:   Crecencio Mc [[id number]]    Return precautions given.   Risks, benefits, and alternatives of the medications and treatment plan prescribed today were discussed, and patient expressed understanding.   Education regarding symptom management and diagnosis given to patient on AVS.  Continue to follow with Burnard Hawthorne, FNP for routine health maintenance.   Patience Musca and I  agreed with plan.   Mable Paris, FNP

## 2021-11-10 NOTE — Assessment & Plan Note (Addendum)
Continued unintentional weight loss. One year ago, weight 191. Today 171.  Benign abdominal exam today.  Patient declines evaluation for hemorrhoid with rectal exam today.  discussed with patient wife today the importance and urgency of malignancy work-up.  Pending stat CT abdomen and pelvis.  He is agreeable to seeing gastroenterology at this time.  I reiterated the importance of screening colonoscopy specially in the setting of change in bowel habits.  Likely patient would require upper endoscopy due to esophageal dilatation seen on CT chest obtained May 2023. Pending anemia labs today. PSA lab ordered today to evaluate for change which would raise concern for malignancy.

## 2021-11-11 ENCOUNTER — Institutional Professional Consult (permissible substitution): Payer: Medicare PPO | Admitting: Pulmonary Disease

## 2021-11-11 ENCOUNTER — Ambulatory Visit
Admission: RE | Admit: 2021-11-11 | Discharge: 2021-11-11 | Disposition: A | Discharge status: Home / Self Care | Payer: Medicare PPO | Source: Ambulatory Visit | Attending: Family | Admitting: Family

## 2021-11-11 ENCOUNTER — Other Ambulatory Visit: Payer: Self-pay

## 2021-11-11 ENCOUNTER — Telehealth: Payer: Self-pay | Admitting: Family

## 2021-11-11 ENCOUNTER — Telehealth: Payer: Self-pay

## 2021-11-11 DIAGNOSIS — R109 Unspecified abdominal pain: Secondary | ICD-10-CM

## 2021-11-11 DIAGNOSIS — R634 Abnormal weight loss: Secondary | ICD-10-CM

## 2021-11-11 DIAGNOSIS — R935 Abnormal findings on diagnostic imaging of other abdominal regions, including retroperitoneum: Secondary | ICD-10-CM

## 2021-11-11 DIAGNOSIS — N200 Calculus of kidney: Secondary | ICD-10-CM | POA: Diagnosis not present

## 2021-11-11 DIAGNOSIS — K5730 Diverticulosis of large intestine without perforation or abscess without bleeding: Secondary | ICD-10-CM | POA: Diagnosis not present

## 2021-11-11 DIAGNOSIS — K8020 Calculus of gallbladder without cholecystitis without obstruction: Secondary | ICD-10-CM | POA: Diagnosis not present

## 2021-11-11 DIAGNOSIS — K6389 Other specified diseases of intestine: Secondary | ICD-10-CM

## 2021-11-11 LAB — POCT I-STAT CREATININE: Creatinine, Ser: 0.8 mg/dL (ref 0.61–1.24)

## 2021-11-11 MED ORDER — PEG 3350-KCL-NA BICARB-NACL 420 G PO SOLR: 4000.0000 mL | Freq: Once | ORAL | 0 refills | Status: AC

## 2021-11-11 MED ORDER — IOHEXOL 300 MG/ML  SOLN
100.0000 mL | Freq: Once | INTRAMUSCULAR | Status: AC | PRN
  Administered 2021-11-11: 100 mL via INTRAVENOUS

## 2021-11-11 NOTE — Telephone Encounter (Signed)
I called and spoke to [facility] with [facility] and she stated that she will get the clearance back to me ASAP.... Clearance form faxed x 3 to [facility] Dr Michiel Cowboy Garnette Czech

## 2021-11-11 NOTE — Telephone Encounter (Signed)
I called wife whom first answered call and explain results of CT a/p Wife drove home to be with husband and called office  I was able to speak with patient and detail CT a/p  Concerns for colon malignancy, pancreas mass, bladder wall thickening, cholelithiasis, atherosclerosis.  Denies abdominal pain with eating, right upper quadrant pain, dysuria, trouble urinating.  Wife will bring by urine specimen today so we can culture.   We agreed to focus on colonoscopy Dr. [name] has been contacted for this.  Patient is agreeable to having colonoscopy done.  Dr [name] advised via staff message that pt had chest 09/28/2021 which showed There is mild circumferential thickening of the thoracic esophagus.   Dr [name] advised to hold on referral to oncology until after colonoscopy.  patient  will make follow-up in the office to begin further discuss findings of CT abdomen and pelvis in detail

## 2021-11-12 NOTE — Telephone Encounter (Signed)
Blood thinner request form received from [facility] Dr Michiel Cowboy Pt is to stop Plavix 5 days prior and restart 1 day after, if pt has biopsies pt will resume 3 days after procedure.... I spoke to pt's wife ok per HIPAA and she is aware of instructions and expressed understanding... Letter released to [employer]

## 2021-11-14 LAB — CELIAC DISEASE AB SCREEN W/RFX
Antigliadin Abs, IgA: 6 units (ref 0–19)
IgA/Immunoglobulin A, Serum: 186 mg/dL (ref 61–437)
Transglutaminase IgA: <2 U/mL (ref 0–3)

## 2021-11-15 ENCOUNTER — Encounter: Payer: Self-pay | Admitting: Gastroenterology

## 2021-11-15 NOTE — Telephone Encounter (Signed)
I called [facility] (movement d/o) made appt for pt 02-04-2022 at 0900.  They will get new pt packet.   Back in [phone] attempted with mobile # (confirmed) x 3 did not get return call back to schedule.  If desires to be on waitlist will need email, pt set up [facility] to be placed on this.

## 2021-11-15 NOTE — Telephone Encounter (Signed)
I called pt spoke to wife.  He is prepping for colonoscopy that is tomorrow.  CT -colon ca?  I relayed what I found out from [facility] and that made appt for him 02-04-2022 at 0900, they will send new pt information packet.  She will hold on [place] [facility] appt,   She stated that she does not feel like she got calls for making appt but she may have since was seeing a lot of physicians trying to figure out what was going on.  She appreciated Dr.Ahern (all she did).  I relayed will let her know and there current plan.

## 2021-11-16 ENCOUNTER — Ambulatory Visit: Payer: Medicare PPO | Admitting: Anesthesiology

## 2021-11-16 ENCOUNTER — Telehealth: Payer: Self-pay | Admitting: Family

## 2021-11-16 ENCOUNTER — Telehealth: Payer: Self-pay

## 2021-11-16 ENCOUNTER — Encounter
Admission: RE | Disposition: A | Discharge status: Home / Self Care | Payer: Self-pay | Source: Home / Self Care | Attending: Gastroenterology

## 2021-11-16 ENCOUNTER — Ambulatory Visit: Payer: Medicare PPO | Admitting: Pulmonary Disease

## 2021-11-16 ENCOUNTER — Institutional Professional Consult (permissible substitution): Payer: Medicare PPO | Admitting: Pulmonary Disease

## 2021-11-16 ENCOUNTER — Ambulatory Visit
Admission: RE | Admit: 2021-11-16 | Discharge: 2021-11-16 | Disposition: A | Discharge status: Home / Self Care | Payer: Medicare PPO | Attending: Gastroenterology | Admitting: Gastroenterology

## 2021-11-16 ENCOUNTER — Encounter: Payer: Self-pay | Admitting: Pulmonary Disease

## 2021-11-16 VITALS — BP 126/76 | HR 64 | Temp 98.0°F | Ht 68.0 in | Wt 176.0 lb

## 2021-11-16 DIAGNOSIS — F419 Anxiety disorder, unspecified: Secondary | ICD-10-CM | POA: Insufficient documentation

## 2021-11-16 DIAGNOSIS — Z89422 Acquired absence of other left toe(s): Secondary | ICD-10-CM | POA: Diagnosis not present

## 2021-11-16 DIAGNOSIS — Z951 Presence of aortocoronary bypass graft: Secondary | ICD-10-CM | POA: Diagnosis not present

## 2021-11-16 DIAGNOSIS — Z79899 Other long term (current) drug therapy: Secondary | ICD-10-CM | POA: Diagnosis not present

## 2021-11-16 DIAGNOSIS — R918 Other nonspecific abnormal finding of lung field: Secondary | ICD-10-CM

## 2021-11-16 DIAGNOSIS — I110 Hypertensive heart disease with heart failure: Secondary | ICD-10-CM | POA: Diagnosis not present

## 2021-11-16 DIAGNOSIS — R933 Abnormal findings on diagnostic imaging of other parts of digestive tract: Secondary | ICD-10-CM | POA: Diagnosis not present

## 2021-11-16 DIAGNOSIS — R634 Abnormal weight loss: Secondary | ICD-10-CM

## 2021-11-16 DIAGNOSIS — F32A Depression, unspecified: Secondary | ICD-10-CM | POA: Insufficient documentation

## 2021-11-16 DIAGNOSIS — I509 Heart failure, unspecified: Secondary | ICD-10-CM | POA: Diagnosis not present

## 2021-11-16 DIAGNOSIS — E1151 Type 2 diabetes mellitus with diabetic peripheral angiopathy without gangrene: Secondary | ICD-10-CM | POA: Insufficient documentation

## 2021-11-16 DIAGNOSIS — G4730 Sleep apnea, unspecified: Secondary | ICD-10-CM | POA: Insufficient documentation

## 2021-11-16 DIAGNOSIS — E785 Hyperlipidemia, unspecified: Secondary | ICD-10-CM | POA: Insufficient documentation

## 2021-11-16 DIAGNOSIS — E1140 Type 2 diabetes mellitus with diabetic neuropathy, unspecified: Secondary | ICD-10-CM | POA: Insufficient documentation

## 2021-11-16 DIAGNOSIS — K6389 Other specified diseases of intestine: Secondary | ICD-10-CM | POA: Diagnosis not present

## 2021-11-16 DIAGNOSIS — Z955 Presence of coronary angioplasty implant and graft: Secondary | ICD-10-CM | POA: Insufficient documentation

## 2021-11-16 DIAGNOSIS — I252 Old myocardial infarction: Secondary | ICD-10-CM | POA: Insufficient documentation

## 2021-11-16 DIAGNOSIS — I2510 Atherosclerotic heart disease of native coronary artery without angina pectoris: Secondary | ICD-10-CM | POA: Diagnosis not present

## 2021-11-16 HISTORY — PX: COLONOSCOPY WITH PROPOFOL: SHX5780

## 2021-11-16 LAB — GLUCOSE, CAPILLARY: Glucose-Capillary: 288 mg/dL — ABNORMAL HIGH (ref 70–99)

## 2021-11-16 SURGERY — COLONOSCOPY WITH PROPOFOL
Anesthesia: General

## 2021-11-16 MED ORDER — PROPOFOL 10 MG/ML IV BOLUS
INTRAVENOUS | Status: AC
  Filled 2021-11-16: qty 40

## 2021-11-16 MED ORDER — PROPOFOL 500 MG/50ML IV EMUL
INTRAVENOUS | Status: DC | PRN
  Administered 2021-11-16: 150 ug/kg/min via INTRAVENOUS

## 2021-11-16 MED ORDER — SODIUM CHLORIDE 0.9 % IV SOLN: INTRAVENOUS | Status: DC

## 2021-11-16 MED ORDER — PROPOFOL 10 MG/ML IV BOLUS
INTRAVENOUS | Status: DC | PRN
  Administered 2021-11-16: 50 mg via INTRAVENOUS

## 2021-11-16 NOTE — Anesthesia Preprocedure Evaluation (Signed)
Anesthesia Evaluation  Patient identified by MRN, date of birth, ID band Patient awake    Reviewed: Allergy & Precautions, NPO status , Patient's Chart, lab work & pertinent test results  Airway Mallampati: III  TM Distance: >3 FB Neck ROM: Full    Dental  (+) Upper Dentures, Lower Dentures   Pulmonary neg pulmonary ROS, sleep apnea ,    Pulmonary exam normal breath sounds clear to auscultation       Cardiovascular Exercise Tolerance: Poor hypertension, Pt. on medications + CAD, + Cardiac Stents, + CABG and +CHF  negative cardio ROS Normal cardiovascular examIII Rhythm:Regular     Neuro/Psych Anxiety Depression negative neurological ROS  negative psych ROS   GI/Hepatic negative GI ROS, Neg liver ROS,   Endo/Other  negative endocrine ROSdiabetes, Well Controlled, Type 1  Renal/GU negative Renal ROS  negative genitourinary   Musculoskeletal   Abdominal Normal abdominal exam  (+)   Peds negative pediatric ROS (+)  Hematology negative hematology ROS (+) Blood dyscrasia, anemia ,   Anesthesia Other Findings Past Medical History: No date: CAD (coronary artery disease) No date: Depression No date: Diabetes mellitus with complication (HCC) No date: Diabetic neuropathy (HCC) No date: History of chickenpox No date: HLD (hyperlipidemia) No date: HTN (hypertension) No date: MI (myocardial infarction) ([place])     Comment:  x3 No date: Osteomyelitis ([place])     Comment:  Left 2nd and 3rd metatarsal No date: 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 1998: CORONARY ARTERY BYPASS GRAFT      Comment:  x1 vessel @ [facility] No date: RETINAL DETACHMENT SURGERY     Comment:  Laser No date: STENTS     Comment:  x 5  1985: VITREOUS RETINAL SURGERY     Comment:  Laser tx OU     Reproductive/Obstetrics negative OB ROS                             Anesthesia Physical Anesthesia Plan  ASA: 3  Anesthesia Plan: General   Post-op Pain Management:    Induction: Intravenous  PONV Risk Score and Plan: Propofol infusion and TIVA  Airway Management Planned: Natural Airway  Additional Equipment:   Intra-op Plan:   Post-operative Plan:   Informed Consent: I have reviewed the patients History and Physical, chart, labs and discussed the procedure including the risks, benefits and alternatives for the proposed anesthesia with the patient or authorized representative who has indicated his/her understanding and acceptance.     Dental Advisory Given  Plan Discussed with: CRNA and Surgeon  Anesthesia Plan Comments:         Anesthesia Quick Evaluation

## 2021-11-16 NOTE — Op Note (Signed)
[facility] Gastroenterology Patient Name: Todd Clay Procedure Date: 11/16/2021 9:57 AM MRN: [id number] Account #: [id number] Date of Birth: [birth year] Admit Type: Outpatient Age: 76 Room: [facility] ENDO ROOM 4 Gender: Male Note Status: Finalized Instrument Name: Jasper Riling [id number] Procedure:             Colonoscopy Indications:           Abnormal CT of the GI tract Providers:             Lucilla Lame MD, MD Referring MD:          Todd Clay (Referring MD) Medicines:             Propofol per Anesthesia Complications:         No immediate complications. Procedure:             Pre-Anesthesia Assessment:                        - Prior to the procedure, a History and Physical was                         performed, and patient medications and allergies were                         reviewed. The patient's tolerance of previous                         anesthesia was also reviewed. The risks and benefits                         of the procedure and the sedation options and risks                         were discussed with the patient. All questions were                         answered, and informed consent was obtained. Prior                         Anticoagulants: The patient has taken no previous                         anticoagulant or antiplatelet agents. ASA Grade                         Assessment: II - A patient with mild systemic disease.                         After reviewing the risks and benefits, the patient                         was deemed in satisfactory condition to undergo the                         procedure.                        After obtaining informed consent, the colonoscope was  passed under direct vision. Throughout the procedure,                         the patient's blood pressure, pulse, and oxygen                         saturations were monitored continuously. The                         Colonoscope was  introduced through the anus and                         advanced to the the cecum, identified by appendiceal                         orifice and ileocecal valve. The colonoscopy was                         performed without difficulty. The patient tolerated                         the procedure well. The quality of the bowel                         preparation was excellent. Findings:      The perianal and digital rectal examinations were normal.      The colon (entire examined portion) appeared normal. Impression:            - The entire examined colon is normal.                        - No specimens collected. Recommendation:        - Discharge patient to home.                        - Resume previous diet.                        - Continue present medications.                        - Perform CT scan (computed tomography) of the abdomen                         with contrast. Procedure Code(s):     --- Professional ---                        [phone], Colonoscopy, flexible; diagnostic, including                         collection of specimen(s) by brushing or washing, when                         performed (separate procedure) Diagnosis Code(s):     --- Professional ---                        R93.3, Abnormal findings on diagnostic imaging of  other parts of digestive tract CPT copyright 2019 American Medical Association. All rights reserved. The codes documented in this report are preliminary and upon coder review may  be revised to meet current compliance requirements. Lucilla Lame MD, MD 11/16/2021 10:23:34 AM This report has been signed electronically. Number of Addenda: 0 Note Initiated On: 11/16/2021 9:57 AM Scope Withdrawal Time: 0 hours 7 minutes 38 seconds  Total Procedure Duration: 0 hours 11 minutes 45 seconds  Estimated Blood Loss:  Estimated blood loss: none.      [facility]

## 2021-11-16 NOTE — Telephone Encounter (Signed)
CT scan scheduled for 6/23 at [facility] outpatient arrive at 8am NPO 12 midnight the night before and must pick up drink prep day before... Todd Clay is aware

## 2021-11-16 NOTE — Transfer of Care (Signed)
Immediate Anesthesia Transfer of Care Note  Patient: Todd Clay  Procedure(s) Performed: COLONOSCOPY WITH PROPOFOL  Patient Location: PACU  Anesthesia Type:General  Level of Consciousness: awake  Airway & Oxygen Therapy: Patient Spontanous Breathing and Patient connected to nasal cannula oxygen  Post-op Assessment: Report given to RN and Post -op Vital signs reviewed and stable  Post vital signs: Reviewed and stable  Last Vitals:  Vitals Value Taken Time  BP 108/45 11/16/21 1027  Temp 36.1 C 11/16/21 1027  Pulse 72 11/16/21 1027  Resp 19 11/16/21 1027  SpO2 96 % 11/16/21 1027  Vitals shown include unvalidated device data.  Last Pain:  Vitals:   11/16/21 1027  TempSrc: Temporal  PainSc: Asleep         Complications: No notable events documented.

## 2021-11-16 NOTE — Patient Instructions (Signed)
Let us know when you have your abdomen and pelvis CT so that we can take a look at it and determine if we need further imaging.  We will see you in follow-up in 2 to 3 months time call sooner should any new problems arise.

## 2021-11-16 NOTE — Telephone Encounter (Signed)
Patient's wife would like [name] to call her back, [phone]. She has questions about patient's CT Scan that was done last week and the colonoscopy that was done today.

## 2021-11-16 NOTE — Telephone Encounter (Signed)
FYI I spoke with patient's wife & let her know you were out office all this week. They are baffled at the results of a completely normal colonoscopy. They have a lot of questions & aren't sure what will happen from here considering he still does have symptoms indicating colon cancer or some type of cancer. They are so confused by all the findings on CT to have nothing at all found on colonoscopy. She just wanted you to know & hopes to talk with you when you return.

## 2021-11-16 NOTE — H&P (Signed)
Lucilla Lame, MD [address] [place], [place] [address] [phone] Fax : [phone]  Primary Care Physician:  Burnard Hawthorne, FNP Primary Gastroenterologist:  Dr. [name]  Pre-Procedure History & Physical: HPI:  Todd Clay is a 76 y.o. male is here for an colonoscopy.   Past Medical History:  Diagnosis Date   CAD (coronary artery disease)    Depression    Diabetes mellitus with complication (HCC)    Diabetic neuropathy ([place])    History of chickenpox    HLD (hyperlipidemia)    HTN (hypertension)    MI (myocardial infarction) ([place])    x3   Osteomyelitis ([place])    Left 2nd and 3rd metatarsal   Proliferative diabetic retinopathy ([place])    OU   PVD (peripheral vascular disease) ([place])    Retinal detachment    OD    Past Surgical History:  Procedure Laterality Date   AMPUTATION OF REPLICATED TOES     Left 2nd and 3rd metatarsal   CARDIAC CATHETERIZATION     CORONARY ANGIOPLASTY     Patient has a total of 5 stents placed.   CORONARY ARTERY BYPASS GRAFT  1998   x1 vessel @ [facility]   RETINAL DETACHMENT SURGERY     Laser   STENTS     x 5    VITREOUS RETINAL SURGERY  1985   Laser tx [facility]    Prior to Admission medications   Medication Sig Start Date End Date Taking? Authorizing Provider  insulin regular (NOVOLIN R RELION) 100 units/mL injection Inject 0.06 mLs (6 Units total) into the skin 3 (three) times daily before meals. Patient taking differently: Inject 4-6 Units into the skin 3 (three) times daily before meals. Sliding Scale 06/19/14  Yes Phadke, Radhika P, MD  losartan (COZAAR) 25 MG tablet Take 0.5 tablets (12.5 mg total) by mouth daily. 04/28/21  Yes Arnett, Yvetta Coder, FNP  mirtazapine (REMERON) 7.5 MG tablet Take 1 tablet (7.5 mg total) by mouth at bedtime. 10/27/21  Yes Burnard Hawthorne, FNP  sertraline (ZOLOFT) 100 MG tablet Take 1 tablet (100 mg total) by mouth daily. 10/27/21  Yes Burnard Hawthorne, FNP  aspirin EC 81 MG tablet  Take 81 mg by mouth daily. Swallow whole.    [provider]  atorvastatin (LIPITOR) 80 MG tablet Take 1 tablet (80 mg total) by mouth daily. 10/27/21   Burnard Hawthorne, FNP  clopidogrel (PLAVIX) 75 MG tablet Take 1 tablet by mouth once daily 06/14/21   Minna Merritts, MD  Continuous Blood Gluc Receiver (FREESTYLE LIBRE 14 DAY READER) DEVI USE TO TEST BLOOD SUGAR 10/12/18   [provider]  furosemide (LASIX) 20 MG tablet Take 1 tablet (20 mg total) by mouth daily as needed. 04/19/21   Burnard Hawthorne, FNP  gabapentin (NEURONTIN) 100 MG capsule Take 1 capsule (100 mg total) by mouth daily. 10/27/21   Burnard Hawthorne, FNP  mupirocin ointment (BACTROBAN) 2 % Apply 1 application  topically 2 (two) times daily. 11/10/21   Burnard Hawthorne, FNP  TRESIBA FLEXTOUCH 100 UNIT/ML SOPN FlexTouch Pen Inject 12 Units into the skin daily. 12/21/17   [provider]  carvedilol (COREG) 6.25 MG tablet Take 6.25 mg by mouth 2 (two) times daily. 11/27/20 07/12/21  [provider]    Allergies as of 11/12/2021   (No Known Allergies)    Family History  Problem Relation Age of Onset   Kidney disease Mother  Kidney failure   Heart disease Mother    Heart disease Father        CAD   Cancer Brother        Head & Neck Tumor   Alcohol abuse Brother    Diabetes Maternal Aunt    Diabetes Maternal Uncle    Diabetes Maternal Grandmother    Cancer Maternal Grandmother    Diabetes Maternal Grandfather    Syncope episode Neg Hx     Social History   Socioeconomic History   Marital status: Married    Spouse name: Heritage manager   Number of children: 2   Years of education: 12   Highest education level: Not on file  Occupational History   Occupation: Retired  Tobacco Use   Smoking status: Never   Smokeless tobacco: Never  Vaping Use   Vaping Use: Never used  Substance and Sexual Activity   Alcohol use: No   Drug use: No   Sexual activity: Not on file  Other  Topics Concern   Not on file  Social History Narrative   Lives at home with wife.   Caffeine use: daily   Social Determinants of Health   Financial Resource Strain: Low Risk  (11/03/2020)   Overall Financial Resource Strain (CARDIA)    Difficulty of Paying Living Expenses: Not hard at all  Food Insecurity: No Food Insecurity (11/03/2020)   Hunger Vital Sign    Worried About Running Out of Food in the Last Year: Never true    Ran Out of Food in the Last Year: Never true  Transportation Needs: No Transportation Needs (11/03/2020)   PRAPARE - Hydrologist (Medical): No    Lack of Transportation (Non-Medical): No  Physical Activity: Unknown (11/03/2020)   Exercise Vital Sign    Days of Exercise per Week: 0 days    Minutes of Exercise per Session: Not on file  Stress: No Stress Concern Present (11/03/2020)   [place]    Feeling of Stress : Not at all  Social Connections: Unknown (11/03/2020)   Social Connection and Isolation Panel [NHANES]    Frequency of Communication with Friends and Family: Not on file    Frequency of Social Gatherings with Friends and Family: Not on file    Attends Religious Services: Not on file    Active Member of Clubs or Organizations: Not on file    Attends Archivist Meetings: Not on file    Marital Status: Married  Intimate Partner Violence: Not At Risk (11/03/2020)   Humiliation, Afraid, Rape, and Kick questionnaire    Fear of Current or Ex-Partner: No    Emotionally Abused: No    Physically Abused: No    Sexually Abused: No    Review of Systems: See HPI, otherwise negative ROS  Physical Exam: BP (!) 153/90   Pulse 83   Temp (!) 97.3 F (36.3 C) (Temporal)   Resp 18   SpO2 100%  General:   Alert,  pleasant and cooperative in NAD Head:  Normocephalic and atraumatic. Neck:  Supple; no masses or thyromegaly. Lungs:  Clear throughout to auscultation.     Heart:  Regular rate and rhythm. Abdomen:  Soft, nontender and nondistended. Normal bowel sounds, without guarding, and without rebound.   Neurologic:  Alert and  oriented x4;  grossly normal neurologically.  Impression/Plan: Todd Clay is here for an colonoscopy to be performed for abnormal CT scan  Risks, benefits, limitations, and alternatives regarding  colonoscopy have been reviewed with the patient.  Questions have been answered.  All parties agreeable.   Lucilla Lame, MD  11/16/2021, 9:55 AM

## 2021-11-16 NOTE — Progress Notes (Signed)
Subjective:    Patient ID: Todd Clay, male    DOB: 1946-03-08, 76 y.o.   MRN: [id number] Patient Care Team: Allegra Grana, Todd Clay as PCP - General (Family Medicine) Antonieta Iba, MD as Consulting Physician (Cardiology) Coralyn Helling, MD as Consulting Physician (Pulmonary Disease) Salena Saner, MD as Consulting Physician (Pulmonary Disease)  Chief Complaint  Patient presents with   pulmonary consult    CT 09/28/2021--no current sx.     HPI Patient is 76 year old lifelong never smoker who presents for the issue of abnormal CT chest performed 28 Sep 2021.  He is kindly referred by Todd Plowman, Todd Clay.  The patient has been asymptomatic with regards to these findings.  The patient has been noted to have a "abnormal density" in the left lower lobe for many years.  These have been noted on routine chest x-rays.  Last noted through our PACS system in 2007.  Going through his care everywhere records as noted that he had CABG which was performed on [facility] at [facility].  Postoperative chest x-rays at that time show left lower lobe atelectasis consistently.  I suspect that this is an area of scarring/rounded atelectasis residual from the patient's CABG at that time.  He has not had any cough, sputum production, shortness of breath, hemoptysis, loss or anorexia.  He is followed by Dr. Coralyn Helling for his issues with sleep apnea.  Records have been extensively reviewed.   Review of Systems A 10 point review of systems was performed and it is as noted above otherwise negative.  Past Medical History:  Diagnosis Date   CAD (coronary artery disease)    Depression    Diabetes mellitus with complication (HCC)    Diabetic neuropathy (HCC)    History of chickenpox    HLD (hyperlipidemia)    HTN (hypertension)    MI (myocardial infarction) (HCC)    x3   Osteomyelitis (HCC)    Left 2nd and 3rd metatarsal   Proliferative diabetic retinopathy (HCC)     OU   PVD (peripheral vascular disease) (HCC)    Retinal detachment    OD   Past Surgical History:  Procedure Laterality Date   AMPUTATION OF REPLICATED TOES     Left 2nd and 3rd metatarsal   CARDIAC CATHETERIZATION     COLONOSCOPY WITH PROPOFOL N/A 11/16/2021   Procedure: COLONOSCOPY WITH PROPOFOL;  Surgeon: Midge Minium, MD;  Location: [facility] ENDOSCOPY;  Service: Endoscopy;  Laterality: N/A;   CORONARY ANGIOPLASTY     Patient has a total of 5 stents placed.   CORONARY ARTERY BYPASS GRAFT  1998   x1 vessel @ [facility]   RETINAL DETACHMENT SURGERY     Laser   STENTS     x 5    VITREOUS RETINAL SURGERY  1985   Laser tx [facility]   Patient Active Problem List   Diagnosis Date Noted   Abnormal CT scan, colon    Skin tear of left upper arm without complication 11/10/2021   Unintentional weight loss 10/27/2021   Atherosclerosis of renal artery (HCC) 10/22/2021   Constipation 09/22/2021   Overweight (BMI 25.0-29.9) 08/19/2021   Chronic diastolic CHF (congestive heart failure) (HCC) 08/19/2021   Essential hypertension 08/18/2021   Altered mental status    Elevated alkaline phosphatase level 08/04/2021   Lung nodule 07/12/2021   Aortic atherosclerosis (HCC) 06/25/2021   Viral upper respiratory tract infection 06/03/2021   Acute cough 06/03/2021   Syncopal episodes 03/12/2021  Coronary artery disease involving native coronary artery of native heart with angina pectoris (HCC) 11/26/2020   Type 2 diabetes mellitus, without long-term current use of insulin (HCC) 11/26/2020   Primary hypertension 11/26/2020   Eosinophilia 10/16/2020   Anxiety and depression 05/06/2020   Anemia 04/15/2020   OSA (obstructive sleep apnea) 03/09/2020   Erectile dysfunction due to arterial insufficiency 01/07/2019   Myopia with astigmatism and presbyopia, bilateral 11/08/2017   Right retinal detachment 11/08/2017   Stable proliferative diabetic retinopathy of both eyes associated with type 1 diabetes mellitus (HCC)  11/08/2017   Hyperlipidemia due to type 1 diabetes mellitus (HCC) 03/06/2017   Hypertension associated with diabetes (HCC) 03/06/2017   Acute congestive heart failure (HCC) 10/11/2016   Dysplastic nevi 07/30/2015   Weakness 01/28/2015   Jerking movements of extremities 01/28/2015   Malar rash 11/03/2014   Diabetic polyneuropathy (HCC) 03/20/2014   Toe amputation status 03/20/2014   History of retinal detachment 03/20/2014   History of osteomyelitis 11/18/2013   Hx of CABG 08/26/2013   Hyperlipidemia 08/26/2013   Orthostatic hypotension 08/26/2013   Diabetes type 1, uncontrolled 08/13/2013   CAD (coronary artery disease) 08/13/2013   Diabetes mellitus with complication (HCC) 08/13/2013   Screen for colon cancer 08/13/2013   Fatigue 08/13/2013   Epiretinal membrane, both eyes 09/19/2012   Pseudophakia of both eyes 09/19/2012   Proliferative diabetic retinopathy, both eyes (HCC) 09/19/2012   Family History  Problem Relation Age of Onset   Kidney disease Mother        Kidney failure   Heart disease Mother    Heart disease Father        CAD   Cancer Brother        Head & Neck Tumor   Alcohol abuse Brother    Diabetes Maternal Aunt    Diabetes Maternal Uncle    Diabetes Maternal Grandmother    Cancer Maternal Grandmother    Diabetes Maternal Grandfather    Syncope episode Neg Hx    Social History   Tobacco Use   Smoking status: Never   Smokeless tobacco: Never  Substance Use Topics   Alcohol use: No   No Known Allergies  Current Meds  Medication Sig   aspirin EC 81 MG tablet Take 81 mg by mouth daily. Swallow whole.   atorvastatin (LIPITOR) 80 MG tablet Take 1 tablet (80 mg total) by mouth daily.   clopidogrel (PLAVIX) 75 MG tablet Take 1 tablet by mouth once daily   Continuous Blood Gluc Receiver (FREESTYLE LIBRE 14 DAY READER) DEVI USE TO TEST BLOOD SUGAR   gabapentin (NEURONTIN) 100 MG capsule Take 1 capsule (100 mg total) by mouth daily.   insulin regular  (NOVOLIN R RELION) 100 units/mL injection Inject 0.06 mLs (6 Units total) into the skin 3 (three) times daily before meals. (Patient taking differently: Inject 4-6 Units into the skin 3 (three) times daily before meals. Sliding Scale)   mupirocin ointment (BACTROBAN) 2 % Apply 1 application  topically 2 (two) times daily.   TRESIBA FLEXTOUCH 100 UNIT/ML SOPN FlexTouch Pen Inject 12 Units into the skin daily.   furosemide (LASIX) 20 MG tablet Take 1 tablet (20 mg total) by mouth daily as needed.    losartan (COZAAR) 25 MG tablet Take 0.5 tablets (12.5 mg total) by mouth daily.   mirtazapine (REMERON) 7.5 MG tablet Take 1 tablet (7.5 mg total) by mouth at bedtime.   sertraline (ZOLOFT) 100 MG tablet Take 1 tablet (100 mg total) by mouth  daily.   Immunization History  Administered Date(s) Administered   Fluad Quad(high Dose 65+) 03/04/2020, 03/12/2021   Influenza Inj Mdck Quad Pf 03/06/2017, 04/17/2018   Influenza,inj,Quad PF,6+ Mos 02/14/2014   Influenza-Unspecified 03/06/2017   PFIZER(Purple Top)SARS-COV-2 Vaccination 07/26/2019, 08/21/2019   Pneumococcal Conjugate-13 04/30/2020   Pneumococcal Polysaccharide-23 04/06/2011   Zoster, Live 04/05/1996        Objective:   Physical Exam BP 126/76 (BP Location: Left Arm, Cuff Size: Normal)   Pulse 64   Temp 98 F (36.7 C) (Temporal)   Ht 5\' 8"  (1.727 m)   Wt 176 lb (79.8 kg)   SpO2 96%   BMI 26.76 kg/m   SpO2: 96 % O2 Device: None (Room air)  GENERAL: Overweight gentleman, no acute distress.  Fully ambulatory.  No conversational dyspnea. HEAD: Normocephalic, atraumatic.  EYES: Pupils equal, round, reactive to light.  No scleral icterus.  MOUTH: Dentures both, oral mucosa moist. NECK: Supple. No thyromegaly. Trachea midline. No JVD.  No adenopathy. PULMONARY: Good air entry bilaterally.  No adventitious sounds. CARDIOVASCULAR: S1 and S2. Regular rate and rhythm.  No rubs, murmurs or gallops heard.  Median sternotomy scar  present. ABDOMEN: Benign. MUSCULOSKELETAL: No joint deformity, no clubbing, no edema.  NEUROLOGIC: No overt focal deficit, no gait disturbance, speech is fluent. SKIN: Intact,warm,dry. PSYCH: Behavior normal.  Representative image from CT performed 28 Sep 2021 showing an area of atelectasis/scarring on the left lower lobe:     Assessment & Plan:     ICD-10-CM   1. Abnormal CT scan of lung  R91.8    Suspect area of scarring/atelectasis Abnormalities on that area note due to many plain films since 1997 Continue to follow expectantly    2. 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  Gailen Shelter, MD Advanced Bronchoscopy PCCM [facility]    *This note was dictated using voice recognition software/Dragon.  Despite best efforts to proofread, errors can occur which can change the meaning. Any transcriptional errors that result from this process are unintentional and may not be fully corrected at the time of dictation.

## 2021-11-16 NOTE — Anesthesia Postprocedure Evaluation (Signed)
Anesthesia Post Note  Patient: Todd Clay  Procedure(s) Performed: COLONOSCOPY WITH PROPOFOL  Patient location during evaluation: PACU Anesthesia Type: General Level of consciousness: awake and oriented Pain management: satisfactory to patient Vital Signs Assessment: post-procedure vital signs reviewed and stable Respiratory status: spontaneous breathing and nonlabored ventilation Cardiovascular status: stable Anesthetic complications: no   No notable events documented.   Last Vitals:  Vitals:   11/16/21 1037 11/16/21 1047  BP: (!) 111/53 128/60  Pulse: 84 80  Resp: (!) 21 19  Temp:    SpO2: 98% 100%    Last Pain:  Vitals:   11/16/21 1047  TempSrc:   PainSc: 0-No pain                 VAN STAVEREN,Malakye Nolden

## 2021-11-17 ENCOUNTER — Other Ambulatory Visit: Payer: Self-pay | Admitting: Family

## 2021-11-17 ENCOUNTER — Encounter: Payer: Self-pay | Admitting: Gastroenterology

## 2021-11-17 DIAGNOSIS — K639 Disease of intestine, unspecified: Secondary | ICD-10-CM

## 2021-11-17 DIAGNOSIS — R109 Unspecified abdominal pain: Secondary | ICD-10-CM

## 2021-11-17 DIAGNOSIS — K8689 Other specified diseases of pancreas: Secondary | ICD-10-CM

## 2021-11-19 ENCOUNTER — Ambulatory Visit
Admission: RE | Admit: 2021-11-19 | Discharge: 2021-11-19 | Disposition: A | Discharge status: Home / Self Care | Payer: Medicare PPO | Source: Ambulatory Visit | Attending: Gastroenterology | Admitting: Gastroenterology

## 2021-11-19 DIAGNOSIS — N3289 Other specified disorders of bladder: Secondary | ICD-10-CM | POA: Diagnosis not present

## 2021-11-19 DIAGNOSIS — R634 Abnormal weight loss: Secondary | ICD-10-CM | POA: Diagnosis not present

## 2021-11-19 DIAGNOSIS — R109 Unspecified abdominal pain: Secondary | ICD-10-CM | POA: Diagnosis not present

## 2021-11-19 DIAGNOSIS — K8020 Calculus of gallbladder without cholecystitis without obstruction: Secondary | ICD-10-CM | POA: Diagnosis not present

## 2021-11-19 DIAGNOSIS — K5730 Diverticulosis of large intestine without perforation or abscess without bleeding: Secondary | ICD-10-CM | POA: Diagnosis not present

## 2021-11-19 MED ORDER — IOHEXOL 300 MG/ML  SOLN
100.0000 mL | Freq: Once | INTRAMUSCULAR | Status: AC | PRN
  Administered 2021-11-19: 100 mL via INTRAVENOUS

## 2021-12-08 DIAGNOSIS — E1069 Type 1 diabetes mellitus with other specified complication: Secondary | ICD-10-CM | POA: Diagnosis not present

## 2021-12-08 DIAGNOSIS — E785 Hyperlipidemia, unspecified: Secondary | ICD-10-CM | POA: Diagnosis not present

## 2021-12-08 DIAGNOSIS — E1042 Type 1 diabetes mellitus with diabetic polyneuropathy: Secondary | ICD-10-CM | POA: Diagnosis not present

## 2021-12-08 DIAGNOSIS — I152 Hypertension secondary to endocrine disorders: Secondary | ICD-10-CM | POA: Diagnosis not present

## 2021-12-08 DIAGNOSIS — E1159 Type 2 diabetes mellitus with other circulatory complications: Secondary | ICD-10-CM | POA: Diagnosis not present

## 2021-12-23 DIAGNOSIS — E1042 Type 1 diabetes mellitus with diabetic polyneuropathy: Secondary | ICD-10-CM | POA: Diagnosis not present

## 2021-12-31 NOTE — Progress Notes (Signed)
Subjective:    Patient ID: Todd Clay, male    DOB: 07/03/1945, 76 y.o.   MRN: [id number]  CC: Todd Clay is a 76 y.o. male who presents today for follow up.   HPI: Accompanied by son     Bowel movements are back to normal. He feels well and no longer feeling anxious. He is sleeping well. He has stopped taking zoloft, remeron. His appetite has improved.  He walks on occasion however no regular exercise.  He continues to describe episodic dizziness.  No syncopal episodes.  Dizziness will occur with position changes or standing for prolonged periods of time.  At times he will lose his balance.  He has chronic bilateral neuropathy.  Status post toe amputations.  He endorses urinary frequency.  Denies dysuria, urinary hesitancy, decreased urine stream, nocturia.  FBG 120.   DM- following with Dr Todd Clay. A1c was 7.0 in 10/27/21.  Compliant with 12.5 mg losartan.  History of microalbuminemia   CT abdomen and pelvis repeated by Dr. [name] 11/19/2021 showed no hepatic masses identified.  Gallstones.  No mass or inflammatory changes of pancreas.  Mild diffuse bladder wall thickening.  No evidence of obstruction in the stomach/bowel.  Diverticulosis present.  No pathologic enlarged lymph nodes.   HISTORY:  Past Medical History:  Diagnosis Date   CAD (coronary artery disease)    Depression    Diabetes mellitus with complication (HCC)    Diabetic neuropathy (Todd Clay)    History of chickenpox    HLD (hyperlipidemia)    HTN (hypertension)    MI (myocardial infarction) (Todd Clay)    x3   Osteomyelitis (Todd Clay)    Left 2nd and 3rd metatarsal   Proliferative diabetic retinopathy (Todd Clay)    OU   PVD (peripheral vascular disease) (Todd Clay)    Retinal detachment    OD   Past Surgical History:  Procedure Laterality Date   AMPUTATION OF REPLICATED TOES     Left 2nd and 3rd metatarsal   CARDIAC CATHETERIZATION     COLONOSCOPY WITH PROPOFOL Todd Clay 11/16/2021   Procedure: COLONOSCOPY WITH PROPOFOL;   Surgeon: Todd Lame, MD;  Location: [facility] ENDOSCOPY;  Service: Endoscopy;  Laterality: Todd Clay;   CORONARY ANGIOPLASTY     Patient has a total of 5 stents placed.   CORONARY ARTERY BYPASS GRAFT  1998   x1 vessel @ Todd Clay   RETINAL DETACHMENT SURGERY     Laser   STENTS     x 5    VITREOUS RETINAL SURGERY  1985   Laser tx OU   Family History  Problem Relation Age of Onset   Kidney disease Mother        Kidney failure   Heart disease Mother    Heart disease Father        CAD   Cancer Brother        Head & Neck Tumor   Alcohol abuse Brother    Diabetes Maternal Aunt    Diabetes Maternal Uncle    Diabetes Maternal Grandmother    Cancer Maternal Grandmother    Diabetes Maternal Grandfather    Syncope episode Neg Hx     Allergies: Patient has no known allergies. Current Outpatient Medications on File Prior to Visit  Medication Sig Dispense Refill   aspirin EC 81 MG tablet Take 81 mg by mouth daily. Swallow whole.     atorvastatin (LIPITOR) 80 MG tablet Take 1 tablet (80 mg total) by mouth daily. 90 tablet 1   clopidogrel (PLAVIX)  75 MG tablet Take 1 tablet by mouth once daily 90 tablet 0   Continuous Blood Gluc Receiver (FREESTYLE LIBRE 14 DAY READER) DEVI USE TO TEST BLOOD SUGAR     furosemide (LASIX) 20 MG tablet Take 1 tablet (20 mg total) by mouth daily as needed. 90 tablet 0   gabapentin (NEURONTIN) 100 MG capsule Take 1 capsule (100 mg total) by mouth daily. 90 capsule 3   insulin regular (NOVOLIN R RELION) 100 units/mL injection Inject 0.06 mLs (6 Units total) into the skin 3 (three) times daily before meals. (Patient taking differently: Inject 4-6 Units into the skin 3 (three) times daily before meals. Sliding Scale) 10 mL 2   losartan (COZAAR) 25 MG tablet Take 0.5 tablets (12.5 mg total) by mouth daily. 90 tablet 1   mupirocin ointment (BACTROBAN) 2 % Apply 1 application  topically 2 (two) times daily. 22 g 2   polyethylene glycol-electrolytes (NULYTELY) 420 g solution Take  4,000 mLs by mouth once.     TRESIBA FLEXTOUCH 100 UNIT/ML SOPN FlexTouch Pen Inject 12 Units into the skin daily.  3   [DISCONTINUED] carvedilol (COREG) 6.25 MG tablet Take 6.25 mg by mouth 2 (two) times daily.     No current facility-administered medications on file prior to visit.    Social History   Tobacco Use   Smoking status: Never   Smokeless tobacco: Never  Vaping Use   Vaping Use: Never used  Substance Use Topics   Alcohol use: No   Drug use: No    Review of Systems  Constitutional:  Negative for chills and fever.  Respiratory:  Negative for cough.   Cardiovascular:  Negative for chest pain and palpitations.  Gastrointestinal:  Negative for nausea and vomiting.  Genitourinary:  Positive for frequency. Negative for difficulty urinating and dysuria.  Neurological:  Positive for numbness (peripheral).      Objective:    BP 124/78 (BP Location: Left Arm, Patient Position: Sitting, Cuff Size: Normal)   Pulse 73   Temp 97.9 F (36.6 C) (Oral)   Ht '5\' 5"'$  (1.651 m)   Wt 173 lb (78.5 kg)   SpO2 98%   BMI 28.79 kg/m  BP Readings from Last 3 Encounters:  01/10/22 124/78  11/16/21 126/76  11/16/21 128/60   Wt Readings from Last 3 Encounters:  01/10/22 173 lb (78.5 kg)  11/16/21 176 lb (79.8 kg)  11/10/21 171 lb (77.6 kg)    Physical Exam Vitals reviewed.  Constitutional:      Appearance: He is well-developed.  Cardiovascular:     Rate and Rhythm: Regular rhythm.     Heart sounds: Normal heart sounds.  Pulmonary:     Effort: Pulmonary effort is normal. No respiratory distress.     Breath sounds: Normal breath sounds. No wheezing, rhonchi or rales.  Skin:    General: Skin is warm and dry.  Neurological:     Mental Status: He is alert.  Psychiatric:        Speech: Speech normal.        Behavior: Behavior normal.        Assessment & Plan:   Problem List Items Addressed This Visit       Cardiovascular and Mediastinum   Aortic atherosclerosis ([place])  - Primary   Relevant Orders   Lipid panel   US Carotid Duplex Bilateral     Endocrine   Diabetes mellitus with complication [facility])   Relevant Orders   Ambulatory referral to Podiatry  [place] Lower Ext Art Bilat     Other   Dizziness    Chronic, stable.  Patient is currently on losartan 12.5 mg daily due to history of microalbuminemia.  I doubt that a small dose is causing orthostasis however  we discussed the preference for staying on this medication due to renal protection in the setting of longstanding diabetes.  Discussed deconditioning is likely playing a role, further complicated by peripheral neuropathy.  Pending ultrasound carotid arteries due to previous history of carotid stenosis.  He has had recent work-up with MRI brain (08/18/21), Holter monitor with cardiology ( 05/2021).  For safety, advised referral to physical therapy and slow progression of exercise.  Patient is agreeable to plan. follow closely      Relevant Orders   IBC + Ferritin   CBC with Differential/Platelet   US Carotid Duplex Bilateral   Ambulatory referral to Physical Therapy   Urinary frequency    Discussed CT abdomen and pelvis findings as it relates to mild diffuse bladder wall thickening.  PSA normal.  Advise urology consult due to CT findings and symptoms.  Patient declines at this time.  Pending urine studies.  Lab Results  Component Value Date   PSA 0.91 11/10/2021   PSA 0.82 04/28/2021            I have discontinued Drayke L. Streck's mirtazapine and sertraline. I am also having him maintain his insulin regular, Tresiba FlexTouch, [employer] 14 Day Reader, aspirin EC, furosemide, losartan, clopidogrel, atorvastatin, gabapentin, mupirocin ointment, and polyethylene glycol-electrolytes.   No orders of the defined types were placed in this encounter.   Return precautions given.   Risks, benefits, and alternatives of the medications and treatment plan prescribed today were discussed, and  patient expressed understanding.   Education regarding symptom management and diagnosis given to patient on AVS.  Continue to follow with Burnard Hawthorne, FNP for routine health maintenance.   Patience Musca and I agreed with plan.   Mable Paris, FNP

## 2022-01-10 ENCOUNTER — Ambulatory Visit: Payer: Medicare PPO | Admitting: Family

## 2022-01-10 ENCOUNTER — Encounter: Payer: Self-pay | Admitting: Family

## 2022-01-10 ENCOUNTER — Telehealth: Payer: Self-pay | Admitting: Family

## 2022-01-10 VITALS — BP 124/78 | HR 73 | Temp 97.9°F | Ht 65.0 in | Wt 173.0 lb

## 2022-01-10 DIAGNOSIS — E118 Type 2 diabetes mellitus with unspecified complications: Secondary | ICD-10-CM

## 2022-01-10 DIAGNOSIS — I700 Atherosclerosis of aorta: Secondary | ICD-10-CM | POA: Diagnosis not present

## 2022-01-10 DIAGNOSIS — R350 Frequency of micturition: Secondary | ICD-10-CM | POA: Diagnosis not present

## 2022-01-10 DIAGNOSIS — R42 Dizziness and giddiness: Secondary | ICD-10-CM | POA: Diagnosis not present

## 2022-01-10 LAB — CBC WITH DIFFERENTIAL/PLATELET
Basophils Absolute: 0 10*3/uL (ref 0.0–0.1)
Basophils Relative: 0.4 % (ref 0.0–3.0)
Eosinophils Absolute: 0.4 10*3/uL (ref 0.0–0.7)
Eosinophils Relative: 6.4 % — ABNORMAL HIGH (ref 0.0–5.0)
HCT: 38.4 % — ABNORMAL LOW (ref 39.0–52.0)
Hemoglobin: 13 g/dL (ref 13.0–17.0)
Lymphocytes Relative: 42.7 % (ref 12.0–46.0)
Lymphs Abs: 2.8 10*3/uL (ref 0.7–4.0)
MCHC: 33.8 g/dL (ref 30.0–36.0)
MCV: 96.7 fl (ref 78.0–100.0)
Monocytes Absolute: 0.5 10*3/uL (ref 0.1–1.0)
Monocytes Relative: 6.9 % (ref 3.0–12.0)
Neutro Abs: 2.9 10*3/uL (ref 1.4–7.7)
Neutrophils Relative %: 43.6 % (ref 43.0–77.0)
Platelets: 190 10*3/uL (ref 150.0–400.0)
RBC: 3.97 Mil/uL — ABNORMAL LOW (ref 4.22–5.81)
RDW: 14.6 % (ref 11.5–15.5)
WBC: 6.6 10*3/uL (ref 4.0–10.5)

## 2022-01-10 LAB — IBC + FERRITIN
Ferritin: 96.8 ng/mL (ref 22.0–322.0)
Iron: 94 ug/dL (ref 42–165)
Saturation Ratios: 32.1 % (ref 20.0–50.0)
TIBC: 292.6 ug/dL (ref 250.0–450.0)
Transferrin: 209 mg/dL — ABNORMAL LOW (ref 212.0–360.0)

## 2022-01-10 LAB — LIPID PANEL
Cholesterol: 149 mg/dL (ref 0–200)
HDL: 51.7 mg/dL (ref >39.00)
LDL Cholesterol: 79 mg/dL (ref 0–99)
NonHDL: 97.44
Total CHOL/HDL Ratio: 3
Triglycerides: 93 mg/dL (ref 0.0–149.0)
VLDL: 18.6 mg/dL (ref 0.0–40.0)

## 2022-01-10 NOTE — Assessment & Plan Note (Addendum)
Discussed CT abdomen and pelvis findings as it relates to mild diffuse bladder wall thickening.  PSA normal.  Advise urology consult due to CT findings and symptoms.  Patient declines at this time.  Pending urine studies.  Lab Results  Component Value Date   PSA 0.91 11/10/2021   PSA 0.82 04/28/2021

## 2022-01-10 NOTE — Assessment & Plan Note (Signed)
Chronic, stable.  Patient is currently on losartan 12.5 mg daily due to history of microalbuminemia.  I doubt that a small dose is causing orthostasis however  we discussed the preference for staying on this medication due to renal protection in the setting of longstanding diabetes.  Discussed deconditioning is likely playing a role, further complicated by peripheral neuropathy.  Pending ultrasound carotid arteries due to previous history of carotid stenosis.  He has had recent work-up with MRI brain (08/18/21), Holter monitor with cardiology ( 05/2021).  For safety, advised referral to physical therapy and slow progression of exercise.  Patient is agreeable to plan. follow closely

## 2022-01-10 NOTE — Telephone Encounter (Signed)
Call pt  Urinalysis and urine culture not collected today.  Please have patient come this week to leave these

## 2022-01-10 NOTE — Patient Instructions (Addendum)
Call [employer] and discuss silver sneakers program  Let me know if you would like to see urology  Referral to physical therapy, ordered ultrasound of carotid, and both  feet.   Let us know if you dont hear back within a week in regards to an appointment being scheduled.

## 2022-01-11 ENCOUNTER — Telehealth: Payer: Self-pay

## 2022-01-11 NOTE — Telephone Encounter (Signed)
Noted  

## 2022-01-11 NOTE — Telephone Encounter (Signed)
Patient called back and lab appointment has been scheduled 6 wk out.

## 2022-01-11 NOTE — Telephone Encounter (Signed)
LVM to call back to schedule appt for cbc in 6 wks

## 2022-01-11 NOTE — Telephone Encounter (Signed)
Spoke to patients wife and she verbalized understanding that he needs to come back in to do urinalysis and urine culture

## 2022-01-12 ENCOUNTER — Other Ambulatory Visit: Payer: Self-pay

## 2022-01-12 DIAGNOSIS — E118 Type 2 diabetes mellitus with unspecified complications: Secondary | ICD-10-CM

## 2022-01-14 ENCOUNTER — Other Ambulatory Visit: Payer: Medicare PPO

## 2022-01-14 DIAGNOSIS — E118 Type 2 diabetes mellitus with unspecified complications: Secondary | ICD-10-CM | POA: Diagnosis not present

## 2022-01-15 LAB — MICROALBUMIN / CREATININE URINE RATIO
Creatinine, Urine: 104 mg/dL (ref 20–320)
Microalb Creat Ratio: 57 mcg/mg creat — ABNORMAL HIGH (ref <30)
Microalb, Ur: 5.9 mg/dL

## 2022-01-18 ENCOUNTER — Other Ambulatory Visit: Payer: Self-pay | Admitting: Family

## 2022-01-18 ENCOUNTER — Telehealth: Payer: Self-pay | Admitting: Cardiovascular Disease

## 2022-01-18 DIAGNOSIS — R634 Abnormal weight loss: Secondary | ICD-10-CM

## 2022-01-18 DIAGNOSIS — F419 Anxiety disorder, unspecified: Secondary | ICD-10-CM

## 2022-01-18 NOTE — Telephone Encounter (Signed)
Noted, will FYI provider

## 2022-01-18 NOTE — Telephone Encounter (Signed)
Noted  

## 2022-01-18 NOTE — Telephone Encounter (Signed)
Attempted to schedule called 3 times removing from recall list.

## 2022-01-19 DIAGNOSIS — Z961 Presence of intraocular lens: Secondary | ICD-10-CM | POA: Diagnosis not present

## 2022-01-19 DIAGNOSIS — H35372 Puckering of macula, left eye: Secondary | ICD-10-CM | POA: Diagnosis not present

## 2022-01-19 DIAGNOSIS — H43812 Vitreous degeneration, left eye: Secondary | ICD-10-CM | POA: Diagnosis not present

## 2022-01-19 DIAGNOSIS — E103593 Type 1 diabetes mellitus with proliferative diabetic retinopathy without macular edema, bilateral: Secondary | ICD-10-CM | POA: Diagnosis not present

## 2022-01-24 ENCOUNTER — Encounter: Payer: Self-pay | Admitting: Podiatry

## 2022-01-24 ENCOUNTER — Ambulatory Visit: Payer: Medicare PPO | Admitting: Podiatry

## 2022-01-24 DIAGNOSIS — B351 Tinea unguium: Secondary | ICD-10-CM | POA: Diagnosis not present

## 2022-01-24 DIAGNOSIS — M79676 Pain in unspecified toe(s): Secondary | ICD-10-CM

## 2022-01-24 DIAGNOSIS — D689 Coagulation defect, unspecified: Secondary | ICD-10-CM

## 2022-01-24 DIAGNOSIS — E1142 Type 2 diabetes mellitus with diabetic polyneuropathy: Secondary | ICD-10-CM | POA: Diagnosis not present

## 2022-01-24 NOTE — Progress Notes (Signed)
Subjective:  Patient ID: Todd Clay, male    DOB: Jun 23, 1945,  MRN: [id number] HPI Chief Complaint  Patient presents with   Diabetes    Diabetic foot exam - last a1c was 7.2, needs toenails trimmed   New Patient (Initial Visit)    76 y.o. male presents with the above complaint.   ROS: Denies fever chills nausea vomiting muscle aches pains calf pain back pain chest pain shortness of breath.  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   CORONARY ARTERY BYPASS GRAFT  1998   x1 vessel @ [facility]   RETINAL DETACHMENT SURGERY     Laser   STENTS     x 5    VITREOUS RETINAL SURGERY  1985   Laser tx OU    Current Outpatient Medications:    aspirin EC 81 MG tablet, Take 81 mg by mouth daily. Swallow whole., Disp: , Rfl:    atorvastatin (LIPITOR) 80 MG tablet, Take 1 tablet (80 mg total) by mouth daily., Disp: 90 tablet, Rfl: 1   clopidogrel (PLAVIX) 75 MG tablet, Take 1 tablet by mouth once daily, Disp: 90 tablet, Rfl: 0   Continuous Blood Gluc Receiver (FREESTYLE LIBRE 14 DAY READER) DEVI, USE TO TEST BLOOD SUGAR, Disp: , Rfl:    furosemide (LASIX) 20 MG tablet, Take 1 tablet (20 mg total) by  mouth daily as needed., Disp: 90 tablet, Rfl: 0   gabapentin (NEURONTIN) 100 MG capsule, Take 1 capsule (100 mg total) by mouth daily., Disp: 90 capsule, Rfl: 3   insulin regular (NOVOLIN R RELION) 100 units/mL injection, Inject 0.06 mLs (6 Units total) into the skin 3 (three) times daily before meals. (Patient taking differently: Inject 4-6 Units into the skin 3 (three) times daily before meals. Sliding Scale), Disp: 10 mL, Rfl: 2   losartan (COZAAR) 25 MG tablet, Take 0.5 tablets (12.5 mg total) by mouth daily., Disp: 90 tablet, Rfl: 1   mupirocin ointment (BACTROBAN) 2 %, Apply 1 application  topically 2 (two) times daily., Disp: 22 g, Rfl: 2   polyethylene glycol-electrolytes (NULYTELY) 420 g solution, Take 4,000 mLs by mouth once., Disp: , Rfl:    TRESIBA FLEXTOUCH 100 UNIT/ML SOPN FlexTouch Pen, Inject 12 Units into the skin daily., Disp: , Rfl: 3  No Known Allergies Review of Systems Objective:  There were no vitals filed for this visit.  General: Well developed, nourished, in no acute distress, alert and oriented x3   Dermatological: Skin is warm, dry and supple bilateral. Nails x3 bilateral are well maintained; remaining integument  appears unremarkable at this time. There are no open sores, no preulcerative lesions, no rash or signs of infection present.  Vascular: Dorsalis Pedis artery and Posterior Tibial artery pedal pulses are 0/4 bilateral with immedate capillary fill time.  No pedal hair growth present. No varicosities and no lower extremity edema present bilateral.   Neruologic: Grossly intact via light touch bilateral. Vibratory diminished via tuning fork bilateral. Protective threshold with Semmes Wienstein monofilament diminished to all pedal sites bilateral. Patellar and Achilles deep tendon reflexes 2+ bilateral. No Babinski or clonus noted bilateral.   Musculoskeletal: No gross boney pedal deformities bilateral. No pain, crepitus, or limitation noted with foot and ankle  range of motion bilateral. Muscular strength 5/5 in all groups tested bilateral.  Gait: Unassisted, Nonantalgic.    Radiographs:  None taken  Assessment & Plan:   Assessment: Severe peripheral vascular disease with a history of amputation.  Pain limb secondary to onychomycosis.  Neuropathy bilateral.  Plan: Debridement of toenails bilaterally.  He is to follow-up with vascular for carotid ultrasound as well as a bilateral lower extremity ultrasound     Gigi Onstad T. [place], [place]

## 2022-01-28 ENCOUNTER — Telehealth: Payer: Self-pay | Admitting: Family

## 2022-01-28 ENCOUNTER — Other Ambulatory Visit: Payer: Self-pay

## 2022-01-28 DIAGNOSIS — R935 Abnormal findings on diagnostic imaging of other abdominal regions, including retroperitoneum: Secondary | ICD-10-CM

## 2022-01-28 MED ORDER — BLOOD GLUCOSE METER KIT: PACK | 0 refills | Status: AC

## 2022-01-28 NOTE — Telephone Encounter (Signed)
Pt needs UA and urine culture  Please sch Tuesday as wife will drop off

## 2022-02-01 ENCOUNTER — Other Ambulatory Visit (INDEPENDENT_AMBULATORY_CARE_PROVIDER_SITE_OTHER): Payer: Medicare PPO

## 2022-02-01 DIAGNOSIS — R935 Abnormal findings on diagnostic imaging of other abdominal regions, including retroperitoneum: Secondary | ICD-10-CM

## 2022-02-01 LAB — URINALYSIS, ROUTINE W REFLEX MICROSCOPIC
Bilirubin Urine: NEGATIVE
Hgb urine dipstick: NEGATIVE
Ketones, ur: NEGATIVE
Leukocytes,Ua: NEGATIVE
Nitrite: NEGATIVE
RBC / HPF: NONE SEEN (ref 0–?)
Specific Gravity, Urine: 1.02 (ref 1.000–1.030)
Total Protein, Urine: NEGATIVE
Urine Glucose: NEGATIVE
Urobilinogen, UA: 0.2 (ref 0.0–1.0)
pH: 5.5 (ref 5.0–8.0)

## 2022-02-02 LAB — URINE CULTURE
MICRO NUMBER:: 13872599
Result:: NO GROWTH
SPECIMEN QUALITY:: ADEQUATE

## 2022-02-04 DIAGNOSIS — E1051 Type 1 diabetes mellitus with diabetic peripheral angiopathy without gangrene: Secondary | ICD-10-CM | POA: Diagnosis not present

## 2022-02-04 DIAGNOSIS — E1043 Type 1 diabetes mellitus with diabetic autonomic (poly)neuropathy: Secondary | ICD-10-CM | POA: Diagnosis not present

## 2022-02-04 DIAGNOSIS — R292 Abnormal reflex: Secondary | ICD-10-CM | POA: Diagnosis not present

## 2022-02-04 DIAGNOSIS — I951 Orthostatic hypotension: Secondary | ICD-10-CM | POA: Diagnosis not present

## 2022-02-04 DIAGNOSIS — E10319 Type 1 diabetes mellitus with unspecified diabetic retinopathy without macular edema: Secondary | ICD-10-CM | POA: Diagnosis not present

## 2022-02-04 DIAGNOSIS — E1042 Type 1 diabetes mellitus with diabetic polyneuropathy: Secondary | ICD-10-CM | POA: Diagnosis not present

## 2022-02-04 NOTE — Telephone Encounter (Signed)
done

## 2022-02-10 ENCOUNTER — Telehealth: Payer: Self-pay | Admitting: Family

## 2022-02-10 NOTE — Telephone Encounter (Signed)
Patient has a lab appt 02/18/22, there are no orders in.

## 2022-02-11 ENCOUNTER — Other Ambulatory Visit: Payer: Self-pay

## 2022-02-11 ENCOUNTER — Ambulatory Visit: Payer: Medicare PPO | Admitting: Pulmonary Disease

## 2022-02-11 DIAGNOSIS — E118 Type 2 diabetes mellitus with unspecified complications: Secondary | ICD-10-CM

## 2022-02-11 NOTE — Telephone Encounter (Signed)
Todd Clay   Future cbc with diff previously ordered  Jenate please re order if needed  Please call patient ensure that he is seen lab note from  02/01/2022 UA in regards to repeating urine or referral to urology.  Please let me know his preference

## 2022-02-11 NOTE — Progress Notes (Signed)
Lab ordered.

## 2022-02-11 NOTE — Telephone Encounter (Signed)
Called and LVM  to inquire if he has seen the result note from 02/01/2022 and is agreeable to referral for Urology or not?

## 2022-02-15 NOTE — Telephone Encounter (Signed)
Spoke to patients wife and she stated that she would speak to him about it and asked could I print the notes off for her and they would get them on Friday at his lab appt. Will print office notes and results from 02/01/22 and give them to her on Fri

## 2022-02-18 ENCOUNTER — Other Ambulatory Visit (INDEPENDENT_AMBULATORY_CARE_PROVIDER_SITE_OTHER): Payer: Medicare PPO

## 2022-02-18 ENCOUNTER — Other Ambulatory Visit: Payer: Self-pay | Admitting: Family

## 2022-02-18 ENCOUNTER — Other Ambulatory Visit: Payer: Self-pay

## 2022-02-18 DIAGNOSIS — E118 Type 2 diabetes mellitus with unspecified complications: Secondary | ICD-10-CM

## 2022-02-18 DIAGNOSIS — R350 Frequency of micturition: Secondary | ICD-10-CM

## 2022-02-18 DIAGNOSIS — R8290 Unspecified abnormal findings in urine: Secondary | ICD-10-CM

## 2022-02-18 LAB — CBC WITH DIFFERENTIAL/PLATELET
Basophils Absolute: 0.1 10*3/uL (ref 0.0–0.1)
Basophils Relative: 0.7 % (ref 0.0–3.0)
Eosinophils Absolute: 0.5 10*3/uL (ref 0.0–0.7)
Eosinophils Relative: 7.2 % — ABNORMAL HIGH (ref 0.0–5.0)
HCT: 36.2 % — ABNORMAL LOW (ref 39.0–52.0)
Hemoglobin: 12.4 g/dL — ABNORMAL LOW (ref 13.0–17.0)
Lymphocytes Relative: 45.6 % (ref 12.0–46.0)
Lymphs Abs: 3.3 10*3/uL (ref 0.7–4.0)
MCHC: 34.2 g/dL (ref 30.0–36.0)
MCV: 95.4 fl (ref 78.0–100.0)
Monocytes Absolute: 0.4 10*3/uL (ref 0.1–1.0)
Monocytes Relative: 6.3 % (ref 3.0–12.0)
Neutro Abs: 2.9 10*3/uL (ref 1.4–7.7)
Neutrophils Relative %: 40.2 % — ABNORMAL LOW (ref 43.0–77.0)
Platelets: 175 10*3/uL (ref 150.0–400.0)
RBC: 3.79 Mil/uL — ABNORMAL LOW (ref 4.22–5.81)
RDW: 14.4 % (ref 11.5–15.5)
WBC: 7.2 10*3/uL (ref 4.0–10.5)

## 2022-02-18 NOTE — Addendum Note (Signed)
Addended by: Neta Ehlers on: 02/18/2022 02:48 PM   Modules accepted: Orders

## 2022-02-18 NOTE — Progress Notes (Signed)
Urine

## 2022-02-22 LAB — URINE CULTURE
MICRO NUMBER:: 13956020
SPECIMEN QUALITY:: ADEQUATE

## 2022-02-22 LAB — URINALYSIS, ROUTINE W REFLEX MICROSCOPIC
Bilirubin Urine: NEGATIVE
Glucose, UA: NEGATIVE
Hgb urine dipstick: NEGATIVE
Ketones, ur: NEGATIVE
Nitrite: NEGATIVE
RBC / HPF: NONE SEEN /HPF (ref 0–2)
Specific Gravity, Urine: 1.014 (ref 1.001–1.035)
Squamous Epithelial / HPF: NONE SEEN /HPF (ref <=5)
pH: 5.5 (ref 5.0–8.0)

## 2022-02-22 LAB — MICROSCOPIC MESSAGE

## 2022-02-23 ENCOUNTER — Other Ambulatory Visit: Payer: Self-pay | Admitting: Family

## 2022-02-23 DIAGNOSIS — N3000 Acute cystitis without hematuria: Secondary | ICD-10-CM

## 2022-02-23 MED ORDER — NITROFURANTOIN MONOHYD MACRO 100 MG PO CAPS: 100.0000 mg | ORAL_CAPSULE | Freq: Two times a day (BID) | ORAL | 0 refills | Status: DC

## 2022-02-24 ENCOUNTER — Other Ambulatory Visit: Payer: Self-pay

## 2022-02-24 DIAGNOSIS — D721 Eosinophilia, unspecified: Secondary | ICD-10-CM

## 2022-02-28 ENCOUNTER — Telehealth: Payer: Self-pay | Admitting: Oncology

## 2022-02-28 ENCOUNTER — Encounter: Payer: Self-pay | Admitting: Family

## 2022-02-28 NOTE — Telephone Encounter (Signed)
Pt wife called and stated his PCP wanted him to come in and see Dr. Tasia Catchings. His eosinophils are elevated. Will this be an urgent appt? Please advise.

## 2022-02-28 NOTE — Telephone Encounter (Signed)
Pt's wife called to cancel appts back in May and did not want to r/s. Please advise on urgency of follow up?

## 2022-02-28 NOTE — Telephone Encounter (Signed)
Please schedule pt for MD, then labs (same day)- next available. Please inform pt of appt.

## 2022-03-04 ENCOUNTER — Other Ambulatory Visit: Payer: Self-pay | Admitting: Family

## 2022-03-04 DIAGNOSIS — N3000 Acute cystitis without hematuria: Secondary | ICD-10-CM

## 2022-03-14 DIAGNOSIS — E1042 Type 1 diabetes mellitus with diabetic polyneuropathy: Secondary | ICD-10-CM | POA: Diagnosis not present

## 2022-03-28 ENCOUNTER — Encounter (INDEPENDENT_AMBULATORY_CARE_PROVIDER_SITE_OTHER): Payer: Self-pay

## 2022-03-30 ENCOUNTER — Encounter: Payer: Self-pay | Admitting: Podiatry

## 2022-03-30 ENCOUNTER — Ambulatory Visit
Admission: RE | Admit: 2022-03-30 | Discharge: 2022-03-30 | Disposition: A | Discharge status: Home / Self Care | Payer: Medicare PPO | Source: Ambulatory Visit | Attending: Family | Admitting: Family

## 2022-03-30 ENCOUNTER — Ambulatory Visit: Payer: Medicare PPO | Admitting: Podiatry

## 2022-03-30 DIAGNOSIS — B351 Tinea unguium: Secondary | ICD-10-CM

## 2022-03-30 DIAGNOSIS — I700 Atherosclerosis of aorta: Secondary | ICD-10-CM | POA: Diagnosis not present

## 2022-03-30 DIAGNOSIS — E1142 Type 2 diabetes mellitus with diabetic polyneuropathy: Secondary | ICD-10-CM

## 2022-03-30 DIAGNOSIS — R42 Dizziness and giddiness: Secondary | ICD-10-CM | POA: Diagnosis not present

## 2022-03-30 DIAGNOSIS — D689 Coagulation defect, unspecified: Secondary | ICD-10-CM | POA: Diagnosis not present

## 2022-03-30 DIAGNOSIS — I6523 Occlusion and stenosis of bilateral carotid arteries: Secondary | ICD-10-CM | POA: Diagnosis not present

## 2022-03-30 DIAGNOSIS — M79676 Pain in unspecified toe(s): Secondary | ICD-10-CM

## 2022-03-30 DIAGNOSIS — R0989 Other specified symptoms and signs involving the circulatory and respiratory systems: Secondary | ICD-10-CM | POA: Diagnosis not present

## 2022-03-30 DIAGNOSIS — E118 Type 2 diabetes mellitus with unspecified complications: Secondary | ICD-10-CM | POA: Diagnosis not present

## 2022-03-30 NOTE — Progress Notes (Signed)
Presents today chief complaint of painful elongated toenails.  Objective: Pulses remain nonpalpable bilateral.  Toenails are slightly elongated.  Assessment severe peripheral vascular disease pain limb secondary onychomycosis.  Plan: Debrided nails bilaterally.

## 2022-03-31 ENCOUNTER — Other Ambulatory Visit: Payer: Self-pay | Admitting: Family

## 2022-03-31 DIAGNOSIS — I6523 Occlusion and stenosis of bilateral carotid arteries: Secondary | ICD-10-CM | POA: Insufficient documentation

## 2022-04-08 ENCOUNTER — Other Ambulatory Visit: Payer: Medicare PPO

## 2022-04-08 ENCOUNTER — Ambulatory Visit: Payer: Medicare PPO | Admitting: Oncology

## 2022-04-08 DIAGNOSIS — I25119 Atherosclerotic heart disease of native coronary artery with unspecified angina pectoris: Secondary | ICD-10-CM | POA: Diagnosis not present

## 2022-04-08 DIAGNOSIS — E1169 Type 2 diabetes mellitus with other specified complication: Secondary | ICD-10-CM | POA: Diagnosis not present

## 2022-04-08 DIAGNOSIS — R001 Bradycardia, unspecified: Secondary | ICD-10-CM | POA: Diagnosis not present

## 2022-04-08 DIAGNOSIS — I10 Essential (primary) hypertension: Secondary | ICD-10-CM | POA: Diagnosis not present

## 2022-04-12 ENCOUNTER — Ambulatory Visit: Payer: Medicare PPO | Admitting: Family

## 2022-04-13 ENCOUNTER — Other Ambulatory Visit: Payer: Self-pay

## 2022-04-13 ENCOUNTER — Encounter: Payer: Self-pay | Admitting: Oncology

## 2022-04-13 ENCOUNTER — Inpatient Hospital Stay: Payer: Medicare PPO | Attending: Oncology

## 2022-04-13 ENCOUNTER — Inpatient Hospital Stay (HOSPITAL_BASED_OUTPATIENT_CLINIC_OR_DEPARTMENT_OTHER): Payer: Medicare PPO | Admitting: Oncology

## 2022-04-13 VITALS — Temp 97.0°F | Resp 18 | Wt 178.6 lb

## 2022-04-13 DIAGNOSIS — D721 Eosinophilia, unspecified: Secondary | ICD-10-CM | POA: Diagnosis not present

## 2022-04-13 DIAGNOSIS — I2510 Atherosclerotic heart disease of native coronary artery without angina pectoris: Secondary | ICD-10-CM | POA: Diagnosis not present

## 2022-04-13 DIAGNOSIS — E785 Hyperlipidemia, unspecified: Secondary | ICD-10-CM | POA: Diagnosis not present

## 2022-04-13 DIAGNOSIS — R0981 Nasal congestion: Secondary | ICD-10-CM | POA: Insufficient documentation

## 2022-04-13 DIAGNOSIS — Z7982 Long term (current) use of aspirin: Secondary | ICD-10-CM | POA: Insufficient documentation

## 2022-04-13 DIAGNOSIS — E1140 Type 2 diabetes mellitus with diabetic neuropathy, unspecified: Secondary | ICD-10-CM | POA: Insufficient documentation

## 2022-04-13 DIAGNOSIS — Z7902 Long term (current) use of antithrombotics/antiplatelets: Secondary | ICD-10-CM | POA: Diagnosis not present

## 2022-04-13 DIAGNOSIS — Z79899 Other long term (current) drug therapy: Secondary | ICD-10-CM | POA: Diagnosis not present

## 2022-04-13 DIAGNOSIS — I252 Old myocardial infarction: Secondary | ICD-10-CM | POA: Diagnosis not present

## 2022-04-13 DIAGNOSIS — I6523 Occlusion and stenosis of bilateral carotid arteries: Secondary | ICD-10-CM | POA: Diagnosis not present

## 2022-04-13 DIAGNOSIS — I739 Peripheral vascular disease, unspecified: Secondary | ICD-10-CM | POA: Diagnosis present

## 2022-04-13 DIAGNOSIS — I10 Essential (primary) hypertension: Secondary | ICD-10-CM | POA: Diagnosis not present

## 2022-04-13 DIAGNOSIS — Z794 Long term (current) use of insulin: Secondary | ICD-10-CM | POA: Insufficient documentation

## 2022-04-13 LAB — COMPREHENSIVE METABOLIC PANEL
ALT: 27 U/L (ref 0–44)
AST: 36 U/L (ref 15–41)
Albumin: 3.7 g/dL (ref 3.5–5.0)
Alkaline Phosphatase: 95 U/L (ref 38–126)
Anion gap: 7 (ref 5–15)
BUN: 25 mg/dL — ABNORMAL HIGH (ref 8–23)
CO2: 23 mmol/L (ref 22–32)
Calcium: 8.7 mg/dL — ABNORMAL LOW (ref 8.9–10.3)
Chloride: 104 mmol/L (ref 98–111)
Creatinine, Ser: 1 mg/dL (ref 0.61–1.24)
GFR, Estimated: >60 mL/min (ref >60)
Glucose, Bld: 142 mg/dL — ABNORMAL HIGH (ref 70–99)
Potassium: 4.2 mmol/L (ref 3.5–5.1)
Sodium: 134 mmol/L — ABNORMAL LOW (ref 135–145)
Total Bilirubin: 0.5 mg/dL (ref 0.3–1.2)
Total Protein: 6.5 g/dL (ref 6.5–8.1)

## 2022-04-13 LAB — CBC WITH DIFFERENTIAL/PLATELET
Abs Immature Granulocytes: 0.01 10*3/uL (ref 0.00–0.07)
Basophils Absolute: 0 10*3/uL (ref 0.0–0.1)
Basophils Relative: 0 %
Eosinophils Absolute: 0.4 10*3/uL (ref 0.0–0.5)
Eosinophils Relative: 6 %
HCT: 39.6 % (ref 39.0–52.0)
Hemoglobin: 13.8 g/dL (ref 13.0–17.0)
Immature Granulocytes: 0 %
Lymphocytes Relative: 44 %
Lymphs Abs: 3.1 10*3/uL (ref 0.7–4.0)
MCH: 32.8 pg (ref 26.0–34.0)
MCHC: 34.8 g/dL (ref 30.0–36.0)
MCV: 94.1 fL (ref 80.0–100.0)
Monocytes Absolute: 0.4 10*3/uL (ref 0.1–1.0)
Monocytes Relative: 6 %
Neutro Abs: 3.1 10*3/uL (ref 1.7–7.7)
Neutrophils Relative %: 44 %
Platelets: 197 10*3/uL (ref 150–400)
RBC: 4.21 MIL/uL — ABNORMAL LOW (ref 4.22–5.81)
RDW: 12.8 % (ref 11.5–15.5)
WBC: 7 10*3/uL (ref 4.0–10.5)
nRBC: 0 % (ref 0.0–0.2)

## 2022-04-13 LAB — IRON AND TIBC
Iron: 103 ug/dL (ref 45–182)
Saturation Ratios: 39 % (ref 17.9–39.5)
TIBC: 265 ug/dL (ref 250–450)
UIBC: 162 ug/dL

## 2022-04-13 LAB — TECHNOLOGIST SMEAR REVIEW: Plt Morphology: NORMAL

## 2022-04-13 LAB — VITAMIN B12: Vitamin B-12: 278 pg/mL (ref 180–914)

## 2022-04-13 LAB — FOLATE: Folate: 21 ng/mL (ref >5.9)

## 2022-04-13 LAB — FERRITIN: Ferritin: 108 ng/mL (ref 24–336)

## 2022-04-13 NOTE — Progress Notes (Unsigned)
Hematology/Oncology Progress note [phone] [phone]      Patient Care Team: Burnard Hawthorne, FNP as PCP - General (Family Medicine) Rockey Situ Kathlene November, MD as Consulting Physician (Cardiology)  REFERRING PROVIDER: Burnard Hawthorne, FNP  CHIEF COMPLAINTS/REASON FOR VISIT:  Eosinophilia   HISTORY OF PRESENTING ILLNESS:   Todd Clay is a  76 y.o.  male with PMH listed below was seen in consultation at the request of  Burnard Hawthorne, FNP  for evaluation of Eosinophilia  Patient recently had blood work done on 08/18/2020, CBC showed increased eosinophil percentage at 6%-[normal reference 0-5%]. He has no complaints today.  With further questioning, he endorses some chronic hand joint pain, morning stiffness.  No unintentional weight loss, fever, night sweats. Also reports chronic sinus congestion, he does not think the symptoms are worse during spring. He was started on losartan 25 mg in November 2021, restarted on Lasix in December 2021.  He reports that helps his breathing symptoms.   INTERVAL HISTORY Todd Clay is a 76 y.o. male who has above history reviewed by me today presents for follow up visit for eosinophilia. Patient denies any skin rash, Denies weight loss, fever, chills, fatigue, night sweats.  Recent CBC on 02/18/22  showed slight increase of eosinophil percentage, absolute eosinophil count is normal at 0.5 Patient was referred back to reestablish care.   Review of Systems  Constitutional:  Positive for fatigue. Negative for appetite change, chills, fever and unexpected weight change.  HENT:   Negative for hearing loss and voice change.   Eyes:  Negative for eye problems and icterus.  Respiratory:  Negative for chest tightness, cough and shortness of breath.   Cardiovascular:  Negative for chest pain and leg swelling.  Gastrointestinal:  Negative for abdominal distention and abdominal pain.  Endocrine: Negative for hot  flashes.  Genitourinary:  Negative for difficulty urinating, dysuria and frequency.   Musculoskeletal:  Positive for arthralgias.       Bilateral hand joint pain and morning stiffness  Skin:  Negative for itching and rash.  Neurological:  Negative for light-headedness and numbness.  Hematological:  Negative for adenopathy. Does not bruise/bleed easily.  Psychiatric/Behavioral:  Negative for confusion.     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   CORONARY ARTERY BYPASS GRAFT  1998   x1 vessel @ [facility]   RETINAL DETACHMENT SURGERY     Laser   STENTS     x 5    VITREOUS RETINAL SURGERY  1985  Laser tx [facility]    SOCIAL HISTORY: Social History   Socioeconomic History   Marital status: Married    Spouse name: Heritage manager   Number of children: 2   Years of education: 12   Highest education level: Not on file  Occupational History   Occupation: Retired  Tobacco Use   Smoking status: Never   Smokeless tobacco: Never  Vaping Use   Vaping Use: Never used  Substance and Sexual Activity   Alcohol use: No   Drug use: No   Sexual activity: Not on file  Other  Topics Concern   Not on file  Social History Narrative   Lives at home with wife.   Caffeine use: daily   Social Determinants of Health   Financial Resource Strain: Low Risk  (11/03/2020)   Overall Financial Resource Strain (CARDIA)    Difficulty of Paying Living Expenses: Not hard at all  Food Insecurity: No Food Insecurity (11/03/2020)   Hunger Vital Sign    Worried About Running Out of Food in the Last Year: Never true    Ran Out of Food in the Last Year: Never true  Transportation Needs: No Transportation Needs (11/03/2020)   PRAPARE - Hydrologist (Medical): No    Lack of Transportation (Non-Medical): No  Physical Activity: Unknown (11/03/2020)   Exercise Vital Sign    Days of Exercise per Week: 0 days    Minutes of Exercise per Session: Not on file  Stress: No Stress Concern Present (11/03/2020)   [place]    Feeling of Stress : Not at all  Social Connections: Unknown (11/03/2020)   Social Connection and Isolation Panel [NHANES]    Frequency of Communication with Friends and Family: Not on file    Frequency of Social Gatherings with Friends and Family: Not on file    Attends Religious Services: Not on file    Active Member of Clubs or Organizations: Not on file    Attends Archivist Meetings: Not on file    Marital Status: Married  Intimate Partner Violence: Not At Risk (11/03/2020)   Humiliation, Afraid, Rape, and Kick questionnaire    Fear of Current or Ex-Partner: No    Emotionally Abused: No    Physically Abused: No    Sexually Abused: No    FAMILY HISTORY: Family History  Problem Relation Age of Onset   Kidney disease Mother        Kidney failure   Heart disease Mother    Heart disease Father        CAD   Cancer Brother        Head & Neck Tumor   Alcohol abuse Brother    Diabetes Maternal Aunt    Diabetes Maternal Uncle    Diabetes Maternal Grandmother     Cancer Maternal Grandmother    Diabetes Maternal Grandfather    Syncope episode Neg Hx     ALLERGIES:  has No Known Allergies.  MEDICATIONS:  Current Outpatient Medications  Medication Sig Dispense Refill   aspirin EC 81 MG tablet Take 81 mg by mouth daily. Swallow whole.     atorvastatin (LIPITOR) 80 MG tablet Take 1 tablet (80 mg total) by mouth daily. 90 tablet 1   blood glucose meter kit and supplies Dispense based on patient and insurance preference. Use up to four times daily as directed. (FOR ICD-10 E10.9, E11.9). 1 each 0   clopidogrel (PLAVIX) 75  MG tablet Take 1 tablet by mouth once daily 90 tablet 0   Continuous Blood Gluc Receiver (FREESTYLE LIBRE 14 DAY READER) DEVI USE TO TEST BLOOD SUGAR     furosemide (LASIX) 20 MG tablet Take 1 tablet (20 mg total) by mouth daily as needed. 90 tablet 0   gabapentin (NEURONTIN) 100 MG capsule Take 1 capsule (100 mg total) by mouth daily. 90 capsule 3   insulin regular (NOVOLIN R RELION) 100 units/mL injection Inject 0.06 mLs (6 Units total) into the skin 3 (three) times daily before meals. (Patient taking differently: Inject 4-6 Units into the skin 3 (three) times daily before meals. Sliding Scale) 10 mL 2   losartan (COZAAR) 25 MG tablet Take 0.5 tablets (12.5 mg total) by mouth daily. 90 tablet 1   mupirocin ointment (BACTROBAN) 2 % Apply 1 application  topically 2 (two) times daily. 22 g 2   TRESIBA FLEXTOUCH 100 UNIT/ML SOPN FlexTouch Pen Inject 12 Units into the skin daily.  3   No current facility-administered medications for this visit.     PHYSICAL EXAMINATION: ECOG PERFORMANCE STATUS: 1 - Symptomatic but completely ambulatory Vitals:   04/13/22 1353  Resp: 18  Temp: (!) 97 F (36.1 C)   Filed Weights   04/13/22 1353  Weight: 178 lb 9.6 oz (81 kg)    Physical Exam Constitutional:      General: He is not in acute distress. HENT:     Head: Normocephalic and atraumatic.  Eyes:     General: No scleral  icterus. Cardiovascular:     Rate and Rhythm: Normal rate and regular rhythm.     Heart sounds: Normal heart sounds.  Pulmonary:     Effort: Pulmonary effort is normal. No respiratory distress.     Breath sounds: No wheezing.  Abdominal:     General: Bowel sounds are normal. There is no distension.     Palpations: Abdomen is soft.  Musculoskeletal:        General: Normal range of motion.     Cervical back: Normal range of motion and neck supple.     Comments: Bilateral interphalangeal joint deformity  Skin:    General: Skin is warm and dry.     Findings: No erythema or rash.  Neurological:     Mental Status: He is alert and oriented to person, place, and time. Mental status is at baseline.     Cranial Nerves: No cranial nerve deficit.     Coordination: Coordination normal.  Psychiatric:        Mood and Affect: Mood normal.     LABORATORY DATA:  I have reviewed the data as listed Lab Results  Component Value Date   WBC 7.0 04/13/2022   HGB 13.8 04/13/2022   HCT 39.6 04/13/2022   MCV 94.1 04/13/2022   PLT 197 04/13/2022   Recent Labs    08/18/21 0947 11/10/21 1204 11/11/21 0853 04/13/22 1303  NA 137 138  --  134*  K 3.8 4.0  --  4.2  CL 106 104  --  104  CO2 19* 26  --  23  GLUCOSE 247* 173*  --  142*  BUN 36* 21  --  25*  CREATININE 1.44* 0.96 0.80 1.00  CALCIUM 8.8* 8.9  --  8.7*  GFRNONAA 51*  --   --  >60  PROT 6.8 6.1  --  6.5  ALBUMIN 3.5 3.4*  --  3.7  AST 46* 29  --  36  ALT  29 20  --  27  ALKPHOS 82 88  --  95  BILITOT 0.9 0.5  --  0.5    Iron/TIBC/Ferritin/ %Sat    Component Value Date/Time   IRON 103 04/13/2022 1303   TIBC 265 04/13/2022 1303   FERRITIN 108 04/13/2022 1303   IRONPCTSAT 39 04/13/2022 1303      RADIOGRAPHIC STUDIES: I have personally reviewed the radiological images as listed and agreed with the findings in the report. US Carotid Duplex Bilateral  Result Date: 03/30/2022 CLINICAL DATA:  76 year old male with dizziness  EXAM: BILATERAL CAROTID DUPLEX ULTRASOUND TECHNIQUE: Pearline Cables scale imaging, color Doppler and duplex ultrasound were performed of bilateral carotid and vertebral arteries in the neck. COMPARISON:  None Available. FINDINGS: Criteria: Quantification of carotid stenosis is based on velocity parameters that correlate the residual internal carotid diameter with NASCET-based stenosis levels, using the diameter of the distal internal carotid lumen as the denominator for stenosis measurement. The following velocity measurements were obtained: RIGHT ICA:  Systolic 833 cm/sec, Diastolic 38 cm/sec CCA:  63 cm/sec SYSTOLIC ICA/CCA RATIO:  2.2 ECA:  202 cm/sec LEFT ICA:  Systolic 825 cm/sec, Diastolic 25 cm/sec CCA:  69 cm/sec SYSTOLIC ICA/CCA RATIO:  2.1 ECA:  118 cm/sec Right Brachial SBP: Not acquired Left Brachial SBP: Not acquired RIGHT CAROTID ARTERY: No significant calcifications of the right common carotid artery. Intermediate waveform maintained. Heterogeneous and partially calcified plaque at the right carotid bifurcation. No significant lumen shadowing. Low resistance waveform of the right ICA. No significant tortuosity. RIGHT VERTEBRAL ARTERY: Antegrade flow with low resistance waveform. LEFT CAROTID ARTERY: No significant calcifications of the left common carotid artery. Intermediate waveform maintained. Heterogeneous and partially calcified plaque at the left carotid bifurcation without significant lumen shadowing. Low resistance waveform of the left ICA. No significant tortuosity. LEFT VERTEBRAL ARTERY:  Antegrade flow with low resistance waveform. IMPRESSION: Heterogeneous and partially calcified plaque at the bilateral carotid bifurcation contributes to 50%-69% stenosis by established duplex criteria. Signed, Dulcy Fanny. Nadene Rubins, RPVI [facility] Electronically Signed   By: Corrie Mckusick D.O.   On: 03/30/2022 15:12   [place] Lower Ext Art Bilat  Result  Date: 03/30/2022 CLINICAL DATA:  76 year old male with diminished pulses EXAM: NONINVASIVE PHYSIOLOGIC VASCULAR STUDY OF BILATERAL LOWER EXTREMITIES TECHNIQUE: Evaluation of both lower extremities was performed at rest, including calculation of ankle-brachial indices, directed duplex COMPARISON:  None Available. FINDINGS: Right ABI:  Not acquired Left ABI:  Not acquired Right Lower Extremity: Directed duplex right lower extremity demonstrates biphasic waveforms throughout. No elevated velocities identified. Left Lower Extremity: Directed duplex of the left lower extremity demonstrates biphasic waveform of the femoropopliteal segment and anterior tibial artery. Monophasic posterior tibial artery. IMPRESSION: ABI not acquired Directed duplex of the right lower extremity demonstrates multiphasic waveforms maintained. Directed duplex left lower extremity demonstrates multiphasic femoropopliteal segment and anterior tibial artery. Monophasic waveform of the posterior tibial artery. Signed, Dulcy Fanny. Nadene Rubins, RPVI [facility] Electronically Signed   By: Corrie Mckusick D.O.   On: 03/30/2022 15:04      ASSESSMENT & PLAN:  1. Eosinophilia, unspecified type    #Eosinophilia, Mild increase of eosinophilia percentage with normal absolute eosinophil count.   Labs are reviewed and discussed with patient. Previous work up showed mildly elevated tryptase, elevated ESR, most likely secondary to chronic inflammation. He does not have any symptoms for mastocytosis Repeat CBC today showed normalization of eosinophil percentage, and  normal AEC.  Recommend observation.    All questions were answered. The patient knows to call the clinic with any problems questions or concerns.  cc Burnard Hawthorne, FNP    Return of visit: follow up PRN Thank you for this kind referral and the opportunity to participate in the care of this patient. A copy of today's  note is routed to referring provider    Earlie Server, MD, PhD 04/13/2022

## 2022-04-18 ENCOUNTER — Ambulatory Visit: Payer: Medicare PPO | Admitting: Family

## 2022-04-27 ENCOUNTER — Ambulatory Visit: Payer: Medicare PPO | Admitting: Family

## 2022-04-27 ENCOUNTER — Encounter: Payer: Self-pay | Admitting: Family

## 2022-04-27 VITALS — BP 130/80 | HR 60 | Temp 98.0°F | Resp 14 | Ht 65.0 in | Wt 182.6 lb

## 2022-04-27 DIAGNOSIS — I10 Essential (primary) hypertension: Secondary | ICD-10-CM

## 2022-04-27 DIAGNOSIS — R7309 Other abnormal glucose: Secondary | ICD-10-CM | POA: Diagnosis not present

## 2022-04-27 LAB — POCT GLYCOSYLATED HEMOGLOBIN (HGB A1C): Hemoglobin A1C: 6.8 % — AB (ref 4.0–5.6)

## 2022-04-27 NOTE — Assessment & Plan Note (Signed)
Blood pressure well controlled today.  He is tolerating losartan 12.5 mg daily.  Dizziness is not bothersome at this time.  will monitor

## 2022-04-27 NOTE — Progress Notes (Signed)
Subjective:    Patient ID: Todd Clay, male    DOB: Feb 15, 1946, 76 y.o.   MRN: [id number]  CC: Todd Clay is a 76 y.o. male who presents today for follow up.   HPI: Accompanied by wife today.  Feels well today.  No new complaints   Appetite has improved.  He has gained weight.   dizziness has improved and no longer bothersome to him.  He did not proceed with physical therapy . no recent falls.    Anxiety has resolved. Sleeping well.   hypertension-compliant with losartan 12.5 mg daily.  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   CORONARY ARTERY BYPASS GRAFT  1998   x1 vessel @ [facility]   RETINAL DETACHMENT SURGERY     Laser   STENTS     x 5    VITREOUS RETINAL SURGERY  1985   Laser tx OU   Family History  Problem Relation Age of Onset   Kidney disease Mother        Kidney failure   Heart disease Mother    Heart disease Father        CAD   Cancer Brother        Head & Neck Tumor   Alcohol abuse Brother    Diabetes Maternal  Aunt    Diabetes Maternal Uncle    Diabetes Maternal Grandmother    Cancer Maternal Grandmother    Diabetes Maternal Grandfather    Syncope episode Neg Hx     Allergies: Patient has no known allergies. Current Outpatient Medications on File Prior to Visit  Medication Sig Dispense Refill   aspirin EC 81 MG tablet Take 81 mg by mouth daily. Swallow whole.     atorvastatin (LIPITOR) 80 MG tablet Take 1 tablet (80 mg total) by mouth daily. 90 tablet 1   blood glucose meter kit and supplies Dispense based on patient and insurance preference. Use up to four times daily as directed. (FOR ICD-10 E10.9, E11.9). 1 each 0   clopidogrel (PLAVIX) 75 MG tablet Take 1 tablet by mouth once daily 90 tablet 0   Continuous Blood Gluc Receiver (FREESTYLE LIBRE 14 DAY READER) DEVI USE TO TEST BLOOD SUGAR     furosemide (LASIX) 20 MG tablet Take 1 tablet (20 mg total) by mouth daily as needed. 90 tablet 0   gabapentin (NEURONTIN) 100 MG capsule Take 1 capsule (100 mg total)  by mouth daily. 90 capsule 3   insulin regular (NOVOLIN R RELION) 100 units/mL injection Inject 0.06 mLs (6 Units total) into the skin 3 (three) times daily before meals. (Patient taking differently: Inject 4-6 Units into the skin 3 (three) times daily before meals. Sliding Scale) 10 mL 2   losartan (COZAAR) 25 MG tablet Take 0.5 tablets (12.5 mg total) by mouth daily. 90 tablet 1   mupirocin ointment (BACTROBAN) 2 % Apply 1 application  topically 2 (two) times daily. 22 g 2   TRESIBA FLEXTOUCH 100 UNIT/ML SOPN FlexTouch Pen Inject 12 Units into the skin daily.  3   [DISCONTINUED] carvedilol (COREG) 6.25 MG tablet Take 6.25 mg by mouth 2 (two) times daily.     No current facility-administered medications on file prior to visit.    Social History   Tobacco Use   Smoking status: Never   Smokeless tobacco: Never  Vaping Use   Vaping Use: Never used  Substance Use Topics   Alcohol use: No   Drug use: No    Review of Systems   Constitutional:  Negative for chills and fever.  Respiratory:  Negative for cough.   Cardiovascular:  Negative for chest pain and palpitations.  Gastrointestinal:  Negative for nausea and vomiting.      Objective:    BP 130/80 (BP Location: Left Arm, Patient Position: Sitting, Cuff Size: Normal)   Pulse 60   Temp 98 F (36.7 C) (Oral)   Resp 14   Ht _0  (1.651 m)   Wt 182 lb 9.6 oz (82.8 kg)   SpO2 96%   BMI 30.39 kg/m  BP Readings from Last 3 Encounters:  04/27/22 130/80  01/10/22 124/78  11/16/21 126/76   Wt Readings from Last 3 Encounters:  04/27/22 182 lb 9.6 oz (82.8 kg)  04/13/22 178 lb 9.6 oz (81 kg)  01/10/22 173 lb (78.5 kg)    Physical Exam Vitals reviewed.  Constitutional:      Appearance: He is well-developed.  Cardiovascular:     Rate and Rhythm: Regular rhythm.     Heart sounds: Normal heart sounds.  Pulmonary:     Effort: Pulmonary effort is normal. No respiratory distress.     Breath sounds: Normal breath sounds. No wheezing, rhonchi or rales.  Skin:    General: Skin is warm and dry.  Neurological:     Mental Status: He is alert.  Psychiatric:        Speech: Speech normal.        Behavior: Behavior normal.        Assessment & Plan:   Problem List Items Addressed This Visit       Cardiovascular and Mediastinum   Primary hypertension    Blood pressure well controlled today.  He is tolerating losartan 12.5 mg daily.  Dizziness is not bothersome at this time.  will monitor      Other Visit Diagnoses     Elevated glucose    -  Primary   Relevant Orders   POCT HgB A1C (Completed)        I am having Todd Clay maintain his insulin regular, [employer] FlexTouch, [employer] 14 Day Reader, aspirin EC, furosemide, losartan, clopidogrel, atorvastatin, gabapentin, mupirocin ointment, and blood glucose meter kit and supplies.   No orders of the defined types were placed in this encounter.   Return precautions given.    Risks, benefits, and alternatives of the medications and treatment plan prescribed today were discussed, and  patient expressed understanding.   Education regarding symptom management and diagnosis given to patient on AVS.  Continue to follow with Burnard Hawthorne, FNP for routine health maintenance.   Patience Musca and I agreed with plan.   Mable Paris, FNP

## 2022-04-27 NOTE — Progress Notes (Signed)
Discussed during OV. Please see OV notes

## 2022-05-03 ENCOUNTER — Ambulatory Visit (INDEPENDENT_AMBULATORY_CARE_PROVIDER_SITE_OTHER): Payer: Medicare PPO | Admitting: Vascular Surgery

## 2022-05-03 ENCOUNTER — Encounter (INDEPENDENT_AMBULATORY_CARE_PROVIDER_SITE_OTHER): Payer: Self-pay | Admitting: Vascular Surgery

## 2022-05-03 VITALS — BP 157/65 | HR 60 | Resp 16 | Wt 181.6 lb

## 2022-05-03 DIAGNOSIS — I701 Atherosclerosis of renal artery: Secondary | ICD-10-CM | POA: Diagnosis not present

## 2022-05-03 DIAGNOSIS — E118 Type 2 diabetes mellitus with unspecified complications: Secondary | ICD-10-CM | POA: Diagnosis not present

## 2022-05-03 DIAGNOSIS — E782 Mixed hyperlipidemia: Secondary | ICD-10-CM

## 2022-05-03 DIAGNOSIS — I10 Essential (primary) hypertension: Secondary | ICD-10-CM | POA: Diagnosis not present

## 2022-05-03 DIAGNOSIS — I6523 Occlusion and stenosis of bilateral carotid arteries: Secondary | ICD-10-CM | POA: Diagnosis not present

## 2022-05-03 NOTE — Progress Notes (Signed)
Patient ID: Todd Clay, male   DOB: July 06, 1945, 76 y.o.   MRN: [id number]  Chief Complaint  Patient presents with   New Admit To SNF    Ref [place] consult carotid atherosclerosis    HPI Todd Clay is a 76 y.o. male.  I am asked to see the patient by Mable Paris for evaluation of carotid stenosis.  I saw him about 6 months ago for renal artery stenosis which was subcritical and we are monitoring and not planning invasive therapy at this point.  He seems to be doing reasonably well.  His primary care physician obtain carotid artery duplex and lower extremity arterial duplex last month which I have reviewed.  The lower extremity arterial duplex did not demonstrate any worrisome hemodynamically significant disease that would require treatment but diffuse atherosclerosis.  His carotid artery duplex was interpreted as 50 to 69% bilateral carotid artery stenosis by velocity criteria.  By our criteria, this would likely be in the 40 to 59% range, but there was moderate disease and he is referred for further evaluation and treatment.  No previous history of TIA or stroke.  No arm or leg weakness or numbness, and no speech or swallowing difficulty.     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   CORONARY ARTERY BYPASS GRAFT  1998   x1 vessel @ [facility]   RETINAL DETACHMENT SURGERY     Laser   STENTS     x 5    VITREOUS RETINAL SURGERY  1985   Laser tx [facility]     Family History  Problem Relation Age of Onset   Kidney disease Mother        Kidney failure   Heart disease Mother    Heart disease Father        CAD   Cancer Brother        Head & Neck Tumor   Alcohol abuse Brother    Diabetes Maternal Aunt    Diabetes Maternal Uncle    Diabetes Maternal Grandmother    Cancer Maternal Grandmother    Diabetes Maternal Grandfather    Syncope episode Neg Hx       Social History   Tobacco Use   Smoking status: Never   Smokeless tobacco: Never  Vaping Use   Vaping Use: Never used  Substance Use Topics   Alcohol use: No   Drug use: No     No Known Allergies  Current Outpatient Medications  Medication Sig Dispense Refill   aspirin EC 81  MG tablet Take 81 mg by mouth daily. Swallow whole.     atorvastatin (LIPITOR) 80 MG tablet Take 1 tablet (80 mg total) by mouth daily. 90 tablet 1   blood glucose meter kit and supplies Dispense based on patient and insurance preference. Use up to four times daily as directed. (FOR ICD-10 E10.9, E11.9). 1 each 0   clopidogrel (PLAVIX) 75 MG tablet Take 1 tablet by mouth once daily 90 tablet 0   Continuous Blood Gluc Receiver (FREESTYLE LIBRE 14 DAY READER) DEVI USE TO TEST BLOOD SUGAR     furosemide (LASIX) 20 MG tablet Take 1 tablet (20 mg total) by mouth daily as needed. 90 tablet 0   gabapentin (NEURONTIN) 100 MG capsule Take 1 capsule (100 mg total) by mouth daily. 90 capsule 3   insulin regular (NOVOLIN R RELION) 100 units/mL injection Inject 0.06 mLs (6 Units total) into the skin 3 (three) times daily before meals. (Patient taking differently: Inject 4-6 Units into the skin 3 (three) times daily before meals. Sliding Scale) 10  mL 2   losartan (COZAAR) 25 MG tablet Take 0.5 tablets (12.5 mg total) by mouth daily. 90 tablet 1   mupirocin ointment (BACTROBAN) 2 % Apply 1 application  topically 2 (two) times daily. 22 g 2   TRESIBA FLEXTOUCH 100 UNIT/ML SOPN FlexTouch Pen Inject 12 Units into the skin daily.  3   No current facility-administered medications for this visit.      REVIEW OF SYSTEMS (Negative unless checked)   Constitutional: _0 Weight loss  _1 Fever  _2 Chills Cardiac: _3 Chest pain   _4 Chest pressure   _5 Palpitations   _6 Shortness of breath when laying flat   _7 Shortness of breath at rest   _8 Shortness of breath with exertion. Vascular:  _9 Pain in legs with walking   _10 Pain in legs at rest   _11 Pain in legs when laying flat   _12 Claudication   _13 Pain in feet when walking  _14 Pain in feet at rest  _15 Pain in feet when laying flat   _16 History of DVT   _17 Phlebitis   _18 Swelling in legs   _19 Varicose veins   _20 Non-healing ulcers Pulmonary:   _21 Uses home oxygen   _22 Productive cough   _23 Hemoptysis   _24 Wheeze  _25 COPD   _26 Asthma Neurologic:  _27 Dizziness  _28 Blackouts   _29 Seizures   _30 History of stroke   _31 History of TIA  _32 Aphasia   _33 Temporary blindness   _34 Dysphagia   _35 Weakness or numbness in arms   _36 Weakness or numbness in legs Musculoskeletal:  _37 Arthritis   _38 Joint swelling   _39 Joint pain   _40 Low back pain Hematologic:  _41 Easy bruising  _42 Easy bleeding   _43 Hypercoagulable state   _44 Anemic  _45 Hepatitis Gastrointestinal:  _46 Blood in stool   _47 Vomiting blood  _48 Gastroesophageal reflux/heartburn   _49 Abdominal pain Genitourinary:  _50 Chronic kidney disease   _51 Difficult urination  _52 Frequent urination  _53 Burning with urination   _54 Hematuria Skin:  _55 Rashes   _56 Ulcers   _57 Wounds Psychological:  _58 History of anxiety   _59  History of major depression.    Physical Exam BP (!) 157/65 (BP Location: Left Arm)   Pulse 60   Resp 16   Wt 181 lb 9.6 oz (82.4 kg)   BMI 30.22 kg/m  Gen:  WD/WN, NAD Head: [place]/AT, No  temporalis wasting.  Ear/Nose/Throat: Hearing grossly intact, nares w/o erythema or drainage, oropharynx w/o Erythema/Exudate Eyes: Conjunctiva clear, sclera non-icteric  Neck: trachea midline.  No JVD.  Pulmonary:  Good air movement, respirations not labored, no use of accessory muscles  Cardiac: RRR, no JVD Vascular:  Vessel Right Left  Radial Palpable Palpable                                   Gastrointestinal:. No masses, surgical incisions, or scars. Musculoskeletal: M/S 5/5 throughout.  Extremities without ischemic changes.  No deformity or atrophy. No edema. Neurologic: Sensation grossly intact in extremities.  Symmetrical.  Speech is fluent. Motor exam as listed above. Psychiatric: Judgment intact, Mood & affect appropriate for pt's clinical situation. Dermatologic: No rashes or ulcers noted.  No cellulitis or open wounds.    Radiology No results found.  Labs Recent Results (from the past 2160 hour(s))  CBC with Differential/Platelet     Status: Abnormal   Collection Time: 02/18/22  1:54 PM  Result Value Ref Range   WBC 7.2 4.0 - 10.5 K/uL   RBC 3.79 (L) 4.22 - 5.81 Mil/uL   Hemoglobin 12.4 (L) 13.0 - 17.0 g/dL   HCT 36.2 (L) 39.0 - 52.0 %   MCV 95.4 78.0 - 100.0 fl   MCHC 34.2 30.0 - 36.0 g/dL   RDW 14.4 11.5 - 15.5 %   Platelets 175.0 150.0 - 400.0 K/uL   Neutrophils Relative % 40.2 (L) 43.0 - 77.0 %   Lymphocytes Relative 45.6 12.0 - 46.0 %   Monocytes Relative 6.3 3.0 - 12.0 %   Eosinophils Relative 7.2 (H) 0.0 - 5.0 %   Basophils Relative 0.7 0.0 - 3.0 %   Neutro Abs 2.9 1.4 - 7.7 K/uL   Lymphs Abs 3.3 0.7 - 4.0 K/uL   Monocytes Absolute 0.4 0.1 - 1.0 K/uL   Eosinophils Absolute 0.5 0.0 - 0.7 K/uL   Basophils Absolute 0.1 0.0 - 0.1 K/uL  Urinalysis, Routine w reflex microscopic     Status: Abnormal   Collection Time: 02/18/22  2:49 PM  Result Value Ref Range   Color, Urine YELLOW YELLOW   APPearance CLOUDY (A) CLEAR   Specific Gravity, Urine 1.014  1.001 - 1.035   pH 5.5 5.0 - 8.0   Glucose, UA NEGATIVE NEGATIVE   Bilirubin Urine NEGATIVE NEGATIVE   Ketones, ur NEGATIVE NEGATIVE   Hgb urine dipstick NEGATIVE NEGATIVE   Protein, ur TRACE (A) NEGATIVE   Nitrite NEGATIVE NEGATIVE   Leukocytes,Ua 1+ (A) NEGATIVE   WBC, UA 20-40 (A) 0 - 5 /HPF   RBC / HPF NONE SEEN 0 - 2 /HPF   Squamous Epithelial / LPF NONE SEEN < OR = 5 /HPF   Bacteria, UA MANY (A) NONE SEEN /HPF   Hyaline Cast 0-5 (A) NONE SEEN /LPF  Urine Culture     Status: Abnormal   Collection Time: 02/18/22  2:49 PM   Specimen: Urine  Result Value Ref Range   MICRO NUMBER: [id number]    SPECIMEN QUALITY: Adequate    Sample Source URINE    STATUS: FINAL    ISOLATE 1: Aerococcus urinae (A)     Comment: Greater than 100,000 CFU/mL of Aerococcus urinae Aerococcus urinae urine isolates usually respond to agents used to treat uncomplicated UTIs, including beta-lactams, nitrofurantoin, or trimethoprim-sulfamethoxazole. Contact the laboratory within 3 days  if additional testing is needed (e.g., complicated or recurrent UTI, or treatment failure).   MICROSCOPIC MESSAGE     Status: None   Collection Time: 02/18/22  2:49 PM  Result Value Ref Range   Note      Comment: This urine was analyzed  for the presence of WBC,  RBC, bacteria, casts, and other formed elements.  Only those elements seen were reported. . .   Vitamin B12     Status: None   Collection Time: 04/13/22  1:03 PM  Result Value Ref Range   Vitamin B-12 278 180 - 914 pg/mL    Comment: (NOTE) This assay is not validated for testing neonatal or myeloproliferative syndrome specimens for Vitamin B12 levels. Performed at [facility], [place] [address] [place], [place] [address]   Folate     Status: None   Collection Time: 04/13/22  1:03 PM  Result Value Ref Range   Folate 21.0 >5.9 ng/mL    Comment: Performed at [facility], [address] [place], [place] [address]  Ferritin     Status: None    Collection Time: 04/13/22  1:03 PM  Result Value Ref Range   Ferritin 108 24 - 336 ng/mL    Comment: Performed at [facility], [address] [place], [place] [address]  Iron and TIBC     Status: None   Collection Time: 04/13/22  1:03 PM  Result Value Ref Range   Iron 103 45 - 182 ug/dL   TIBC 265 250 - 450 ug/dL   Saturation Ratios 39 17.9 - 39.5 %   UIBC 162 ug/dL    Comment: Performed at [facility], [address] [place], [place] [address]  Comprehensive metabolic panel     Status: Abnormal   Collection Time: 04/13/22  1:03 PM  Result Value Ref Range   Sodium 134 (L) 135 - 145 mmol/L   Potassium 4.2 3.5 - 5.1 mmol/L   Chloride 104 98 - 111 mmol/L   CO2 23 22 - 32 mmol/L   Glucose, Bld 142 (H) 70 - 99 mg/dL    Comment: Glucose reference range applies only to samples taken after fasting for at least 8 hours.   BUN 25 (H) 8 - 23 mg/dL   Creatinine, Ser 1.00 0.61 - 1.24 mg/dL   Calcium 8.7 (L) 8.9 - 10.3 mg/dL   Total Protein 6.5 6.5 - 8.1 g/dL   Albumin 3.7 3.5 - 5.0 g/dL   AST 36 15 - 41 U/L   ALT 27 0 - 44 U/L   Alkaline Phosphatase 95 38 - 126 U/L   Total Bilirubin 0.5 0.3 - 1.2 mg/dL   GFR, Estimated >60 >60 mL/min    Comment: (NOTE) Calculated using the CKD-EPI Creatinine Equation (2021)    Anion gap 7 5 - 15    Comment: Performed at [facility], [address] [place], [place] [address]  CBC with Differential/Platelet     Status: Abnormal   Collection Time: 04/13/22  1:03 PM  Result Value Ref Range   WBC 7.0 4.0 - 10.5 K/uL   RBC 4.21 (L) 4.22 - 5.81 MIL/uL   Hemoglobin 13.8 13.0 - 17.0 g/dL   HCT 39.6 39.0 - 52.0 %   MCV 94.1 80.0 - 100.0 fL   MCH 32.8 26.0 - 34.0 pg   MCHC 34.8 30.0 - 36.0 g/dL   RDW 12.8 11.5 - 15.5 %   Platelets 197 150 - 400 K/uL   nRBC 0.0 0.0 - 0.2 %   Neutrophils Relative % 44 %   Neutro Abs 3.1 1.7 - 7.7 K/uL   Lymphocytes Relative 44 %   Lymphs Abs 3.1 0.7 - 4.0 K/uL   Monocytes Relative 6 %    Monocytes Absolute  0.4 0.1 - 1.0 K/uL   Eosinophils Relative 6 %   Eosinophils Absolute 0.4 0.0 - 0.5 K/uL   Basophils Relative 0 %   Basophils Absolute 0.0 0.0 - 0.1 K/uL   Immature Granulocytes 0 %   Abs Immature Granulocytes 0.01 0.00 - 0.07 K/uL    Comment: Performed at [facility], [address], [place], [place] [address]  Technologist smear review     Status: None   Collection Time: 04/13/22  1:03 PM  Result Value Ref Range   WBC MORPHOLOGY MORPHOLOGY UNREMARKABLE    RBC MORPHOLOGY MORPHOLOGY UNREMARKABLE    Plt Morphology Normal platelet morphology     Comment: PLATELETS APPEAR ADEQUATE   Clinical Information Eosinophilia     Comment: Performed at [facility], [address] [place], [place] [address]  POCT HgB A1C     Status: Abnormal   Collection Time: 04/27/22  3:41 PM  Result Value Ref Range   Hemoglobin A1C 6.8 (A) 4.0 - 5.6 %   HbA1c POC (<> result, manual entry)     HbA1c, POC (prediabetic range)     HbA1c, POC (controlled diabetic range)      Assessment/Plan: Essential hypertension blood pressure control important in reducing the progression of atherosclerotic disease. On appropriate oral medications.     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 Continue statin therapy     Diabetes mellitus with complication (HCC) blood glucose control important in reducing the progression of atherosclerotic disease. Also, involved in wound healing. On appropriate medications.     Atherosclerosis of renal artery (HCC) Renal artery duplex was performed about 6 months ago demonstrating fairly normal kidney size of over 10 cm bilaterally with fairly normal renal artery velocities on each side but a little mildly elevated on the left.  This would not demonstrate any greater than 60% renal artery stenosis.  From his CT scan he has significant calcification, but no  hemodynamically significant stenosis of the renal arteries.  No role for intervention or further invasive evaluation at this time.  As long as his blood pressure control and renal function remained stable, we will check this annually  Carotid stenosis, bilateral His carotid artery duplex was interpreted as 50 to 69% bilateral carotid artery stenosis by velocity criteria.  By our criteria, this would likely be in the 40 to 59% range, but there was moderate disease He is on appropriate medical therapy with aspirin, Plavix, and Lipitor. He does not have any focal symptoms at this point so no role for intervention. Plan recheck with duplex in 6 months.      Leotis Pain 05/03/2022, 12:51 PM   This note was created with Dragon medical transcription system.  Any errors from dictation are unintentional.

## 2022-05-03 NOTE — Assessment & Plan Note (Signed)
His carotid artery duplex was interpreted as 50 to 69% bilateral carotid artery stenosis by velocity criteria.  By our criteria, this would likely be in the 40 to 59% range, but there was moderate disease He is on appropriate medical therapy with aspirin, Plavix, and Lipitor. He does not have any focal symptoms at this point so no role for intervention. Plan recheck with duplex in 6 months.

## 2022-05-13 ENCOUNTER — Encounter: Payer: Self-pay | Admitting: Family

## 2022-05-13 ENCOUNTER — Ambulatory Visit: Payer: Medicare PPO | Admitting: Family

## 2022-05-13 VITALS — BP 136/84 | HR 54 | Temp 97.5°F | Ht 65.0 in | Wt 179.4 lb

## 2022-05-13 DIAGNOSIS — Z20828 Contact with and (suspected) exposure to other viral communicable diseases: Secondary | ICD-10-CM | POA: Insufficient documentation

## 2022-05-13 LAB — POCT INFLUENZA A/B
Influenza A, POC: NEGATIVE
Influenza B, POC: NEGATIVE

## 2022-05-13 LAB — POC COVID19 BINAXNOW: SARS Coronavirus 2 Ag: NEGATIVE

## 2022-05-13 LAB — POCT RAPID STREP A (OFFICE): Rapid Strep A Screen: NEGATIVE

## 2022-05-13 MED ORDER — OSELTAMIVIR PHOSPHATE 75 MG PO CAPS: 75.0000 mg | ORAL_CAPSULE | Freq: Two times a day (BID) | ORAL | 0 refills | Status: DC

## 2022-05-13 MED ORDER — BENZONATATE 100 MG PO CAPS: 100.0000 mg | ORAL_CAPSULE | Freq: Three times a day (TID) | ORAL | 1 refills | Status: DC | PRN

## 2022-05-13 NOTE — Assessment & Plan Note (Signed)
No acute respiratory distress.  He has been experiencing chills.  Although patient  thus far negative for influenza,  however I am highly suspicious that he is developing influenza based on his symptoms and close exposure in his home.  We opted to treat with treatment dose of Tamiflu versus exposure dose.  Provided him with Ladona Ridgel.  He will let me know how he is doing

## 2022-05-13 NOTE — Progress Notes (Signed)
Assessment & Plan:  Exposure to influenza Assessment & Plan: No acute respiratory distress.  He has been experiencing chills.  Although patient  thus far negative for influenza,  however I am highly suspicious that he is developing influenza based on his symptoms and close exposure in his home.  We opted to treat with treatment dose of Tamiflu versus exposure dose.  Provided him with Ladona Ridgel.  He will let me know how he is doing  Orders: -     Benzonatate; Take 1 capsule (100 mg total) by mouth 3 (three) times daily as needed for cough.  Dispense: 20 capsule; Refill: 1 -     Oseltamivir Phosphate; Take 1 capsule (75 mg total) by mouth 2 (two) times daily.  Dispense: 10 capsule; Refill: 0 -     POCT rapid strep A -     POCT Influenza A/B -     POC COVID-19 BinaxNow     Return precautions given.   Risks, benefits, and alternatives of the medications and treatment plan prescribed today were discussed, and patient expressed understanding.   Education regarding symptom management and diagnosis given to patient on AVS either electronically or printed.  No follow-ups on file.  Mable Paris, FNP  Subjective:    Patient ID: Todd Clay, male    DOB: 1945/06/16, 76 y.o.   MRN: [id number]  CC: ANTION Clay is a 76 y.o. male who presents today for an acute visit.    HPI: Complains of congestion, sneezing, coughing x 4 day, unchanged.  Endorses chills, tactile warmth Denies CP, sob, wheezing He took asa 335m and felt better.   Son and wife diagnosed with flu whom he has been in close contact.   No h/o ckd  H/o chronic diastolic CHF, aortic atherosclerosis, hypertension, osa Never smoker  Allergies: Patient has no known allergies. Current Outpatient Medications on File Prior to Visit  Medication Sig Dispense Refill   aspirin EC 81 MG tablet Take 81 mg by mouth daily. Swallow whole.     atorvastatin (LIPITOR) 80 MG tablet Take 1 tablet (80 mg total) by mouth  daily. 90 tablet 1   blood glucose meter kit and supplies Dispense based on patient and insurance preference. Use up to four times daily as directed. (FOR ICD-10 E10.9, E11.9). 1 each 0   clopidogrel (PLAVIX) 75 MG tablet Take 1 tablet by mouth once daily 90 tablet 0   Continuous Blood Gluc Receiver (FREESTYLE LIBRE 14 DAY READER) DEVI USE TO TEST BLOOD SUGAR     furosemide (LASIX) 20 MG tablet Take 1 tablet (20 mg total) by mouth daily as needed. 90 tablet 0   gabapentin (NEURONTIN) 100 MG capsule Take 1 capsule (100 mg total) by mouth daily. 90 capsule 3   insulin regular (NOVOLIN R RELION) 100 units/mL injection Inject 0.06 mLs (6 Units total) into the skin 3 (three) times daily before meals. (Patient taking differently: Inject 4-6 Units into the skin 3 (three) times daily before meals. Sliding Scale) 10 mL 2   losartan (COZAAR) 25 MG tablet Take 0.5 tablets (12.5 mg total) by mouth daily. 90 tablet 1   mupirocin ointment (BACTROBAN) 2 % Apply 1 application  topically 2 (two) times daily. 22 g 2   TRESIBA FLEXTOUCH 100 UNIT/ML SOPN FlexTouch Pen Inject 12 Units into the skin daily.  3   [DISCONTINUED] carvedilol (COREG) 6.25 MG tablet Take 6.25 mg by mouth 2 (two) times daily.     No current facility-administered  medications on file prior to visit.    Review of Systems  Constitutional:  Positive for chills and fever (tactile warmth).  Respiratory:  Negative for cough.   Cardiovascular:  Negative for chest pain and palpitations.  Gastrointestinal:  Negative for nausea and vomiting.      Objective:    BP 136/84   Pulse (!) 54   Temp (!) 97.5 F (36.4 C) (Oral)   Ht _0  (1.651 m)   Wt 179 lb 6.4 oz (81.4 kg)   SpO2 98%   BMI 29.85 kg/m   BP Readings from Last 3 Encounters:  05/13/22 136/84  05/03/22 (!) 157/65  04/27/22 130/80   Wt Readings from Last 3 Encounters:  05/13/22 179 lb 6.4 oz (81.4 kg)  05/03/22 181 lb 9.6 oz (82.4 kg)  04/27/22 182 lb 9.6 oz (82.8 kg)     Physical Exam Vitals reviewed.  Constitutional:      Appearance: He is well-developed.  HENT:     Head: Normocephalic and atraumatic.     Right Ear: Hearing, tympanic membrane, ear canal and external ear normal. No decreased hearing noted. No drainage, swelling or tenderness. No middle ear effusion. Tympanic membrane is not injected, erythematous or bulging.     Left Ear: Hearing, tympanic membrane, ear canal and external ear normal. No decreased hearing noted. No drainage, swelling or tenderness.  No middle ear effusion. Tympanic membrane is not injected, erythematous or bulging.     Nose: Nose normal.     Right Sinus: No maxillary sinus tenderness or frontal sinus tenderness.     Left Sinus: No maxillary sinus tenderness or frontal sinus tenderness.     Mouth/Throat:     Pharynx: Uvula midline. No oropharyngeal exudate or posterior oropharyngeal erythema.     Tonsils: No tonsillar abscesses.  Eyes:     Conjunctiva/sclera: Conjunctivae normal.  Cardiovascular:     Rate and Rhythm: Regular rhythm.     Heart sounds: Normal heart sounds.  Pulmonary:     Effort: Pulmonary effort is normal. No respiratory distress.     Breath sounds: Normal breath sounds. No wheezing, rhonchi or rales.  Lymphadenopathy:     Head:     Right side of head: No submental, submandibular, tonsillar, preauricular, posterior auricular or occipital adenopathy.     Left side of head: No submental, submandibular, tonsillar, preauricular, posterior auricular or occipital adenopathy.     Cervical: No cervical adenopathy.  Skin:    General: Skin is warm and dry.  Neurological:     Mental Status: He is alert.  Psychiatric:        Speech: Speech normal.        Behavior: Behavior normal.

## 2022-05-24 ENCOUNTER — Other Ambulatory Visit: Payer: Self-pay | Admitting: Family

## 2022-05-24 DIAGNOSIS — I152 Hypertension secondary to endocrine disorders: Secondary | ICD-10-CM

## 2022-06-01 ENCOUNTER — Encounter: Payer: Self-pay | Admitting: Podiatry

## 2022-06-01 ENCOUNTER — Ambulatory Visit: Payer: Medicare PPO | Admitting: Podiatry

## 2022-06-01 VITALS — BP 133/51 | HR 66

## 2022-06-01 DIAGNOSIS — B351 Tinea unguium: Secondary | ICD-10-CM

## 2022-06-01 DIAGNOSIS — S90415A Abrasion, left lesser toe(s), initial encounter: Secondary | ICD-10-CM | POA: Diagnosis not present

## 2022-06-01 DIAGNOSIS — M79676 Pain in unspecified toe(s): Secondary | ICD-10-CM | POA: Diagnosis not present

## 2022-06-01 DIAGNOSIS — D689 Coagulation defect, unspecified: Secondary | ICD-10-CM | POA: Diagnosis not present

## 2022-06-01 DIAGNOSIS — E1142 Type 2 diabetes mellitus with diabetic polyneuropathy: Secondary | ICD-10-CM

## 2022-06-01 NOTE — Progress Notes (Signed)
Presents today after an abrasion to his hallux left.  He states that there was a scab on it and he knocked it off and it bled in the shower otherwise he would like to have his nails trimmed because he can feel that they are starting to digging into the toes.  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  But he does have strong palpable pulse left foot minimal on the right foot.  Nails are long thick yellow dystrophic clinically mycotic bilateral x 4.  Plan: I debrided the nails for him today cleaned up his abrasion and placed light dressing with Neosporin.  He will follow-up with me on an as-needed basis.

## 2022-06-12 DIAGNOSIS — E1042 Type 1 diabetes mellitus with diabetic polyneuropathy: Secondary | ICD-10-CM | POA: Diagnosis not present

## 2022-06-15 DIAGNOSIS — E1042 Type 1 diabetes mellitus with diabetic polyneuropathy: Secondary | ICD-10-CM | POA: Diagnosis not present

## 2022-07-01 ENCOUNTER — Other Ambulatory Visit: Payer: Self-pay | Admitting: Family

## 2022-07-01 DIAGNOSIS — I5031 Acute diastolic (congestive) heart failure: Secondary | ICD-10-CM

## 2022-08-30 DIAGNOSIS — E785 Hyperlipidemia, unspecified: Secondary | ICD-10-CM | POA: Diagnosis not present

## 2022-08-30 DIAGNOSIS — E1069 Type 1 diabetes mellitus with other specified complication: Secondary | ICD-10-CM | POA: Diagnosis not present

## 2022-08-30 DIAGNOSIS — I152 Hypertension secondary to endocrine disorders: Secondary | ICD-10-CM | POA: Diagnosis not present

## 2022-08-30 DIAGNOSIS — E1042 Type 1 diabetes mellitus with diabetic polyneuropathy: Secondary | ICD-10-CM | POA: Diagnosis not present

## 2022-09-05 ENCOUNTER — Other Ambulatory Visit: Payer: Self-pay | Admitting: Family

## 2022-09-05 DIAGNOSIS — E1159 Type 2 diabetes mellitus with other circulatory complications: Secondary | ICD-10-CM

## 2022-09-19 DIAGNOSIS — E1042 Type 1 diabetes mellitus with diabetic polyneuropathy: Secondary | ICD-10-CM | POA: Diagnosis not present

## 2022-09-22 DIAGNOSIS — E1042 Type 1 diabetes mellitus with diabetic polyneuropathy: Secondary | ICD-10-CM | POA: Diagnosis not present

## 2022-09-29 ENCOUNTER — Other Ambulatory Visit: Payer: Self-pay | Admitting: Family

## 2022-09-29 DIAGNOSIS — I5031 Acute diastolic (congestive) heart failure: Secondary | ICD-10-CM

## 2022-10-05 ENCOUNTER — Encounter: Payer: Self-pay | Admitting: Family

## 2022-10-05 ENCOUNTER — Ambulatory Visit: Payer: Medicare PPO | Admitting: Family

## 2022-10-05 ENCOUNTER — Ambulatory Visit (INDEPENDENT_AMBULATORY_CARE_PROVIDER_SITE_OTHER): Payer: Medicare PPO | Admitting: Podiatry

## 2022-10-05 VITALS — BP 126/78 | HR 69 | Temp 98.0°F | Ht 65.0 in | Wt 190.6 lb

## 2022-10-05 DIAGNOSIS — D689 Coagulation defect, unspecified: Secondary | ICD-10-CM

## 2022-10-05 DIAGNOSIS — F419 Anxiety disorder, unspecified: Secondary | ICD-10-CM

## 2022-10-05 DIAGNOSIS — F32A Depression, unspecified: Secondary | ICD-10-CM | POA: Diagnosis not present

## 2022-10-05 DIAGNOSIS — M79676 Pain in unspecified toe(s): Secondary | ICD-10-CM

## 2022-10-05 DIAGNOSIS — M7989 Other specified soft tissue disorders: Secondary | ICD-10-CM | POA: Diagnosis not present

## 2022-10-05 DIAGNOSIS — R531 Weakness: Secondary | ICD-10-CM | POA: Diagnosis not present

## 2022-10-05 DIAGNOSIS — B351 Tinea unguium: Secondary | ICD-10-CM

## 2022-10-05 DIAGNOSIS — E1142 Type 2 diabetes mellitus with diabetic polyneuropathy: Secondary | ICD-10-CM

## 2022-10-05 MED ORDER — SERTRALINE HCL 50 MG PO TABS
50.0000 mg | ORAL_TABLET | Freq: Every day | ORAL | 3 refills | Status: DC
Start: 2022-10-05 — End: 2022-12-09

## 2022-10-05 NOTE — Assessment & Plan Note (Addendum)
Walking with patient up and down the hall.  Patient was not short of breath.  Gait wide-based.  Gait not ataxic.  Strongly advised patient to  proceed to the emergency room for stat neuroimaging to rule out CVA.  He declines as he feels 'perfectly fine right now'. Previous symptoms of "feeling drunk" has resolved.  I have ordered stat MRI brain wo contrast.  Insurance required prior authorization which will be obtained by referral coordinator.  Patient would like MRI brain scheduled tomorrow.  Strict return precautions and advised patient if episode were to recur as he felt this morning, he is not to delay care and to call 911 or report to nearest emergency room.  Patient and wife verbalized understanding.

## 2022-10-05 NOTE — Patient Instructions (Addendum)
Take another dose of lasix tonight , total of 40mg  today  Tomorrow you may go up to 60mg  lasix 10/06/22 , tomorrow, however if swelling is not improving, I would would advise emergency room for IV diuresis.    Please have labs AND chest xray done at [facility] tomorrow .  We are working diligently to schedule MRI of the brain. We will call to schedule.   As discussed, if you have any recurrence of episode which occurred this morning in which gait changes, you experience a headache certainly if you experience any confusion, please do not delay care and call 911 or report to nearest emergency room.

## 2022-10-05 NOTE — Progress Notes (Signed)
He presents today chief concern diabetic footcare.  States he has been falling a lot feels that may be associated with diabetes or neuropathy.  Objective: Vital signs stable he is alert and oriented x 3 pulses are palpable.  Toenails are long thick yellow dystrophic onychomycotic no open lesions or wounds.  Mild edema about the ankles.  This is pitting in nature.  Assessment: Pain in limb secondary to onychomycosis and bilateral edema.  Diabetic peripheral neuropathy  Plan: Debrided nails 1 through 5 bilaterally.  Recommend he follow-up with his primary care provider.

## 2022-10-05 NOTE — Assessment & Plan Note (Addendum)
No acute respiratory distress.Patient has slept on 2 pillows for years.  I am concerned in regards to 11 pound weight gain.  Advised patient to continue with Lasix 40 mg for now. Tomorrow advised him to take Lasix 40 mg and he may increase to 60 mg if swelling does not improve.  At that point, if swelling is not improving, I advised patient to go to the emergency room as he may need IV diuresis.  He does have a follow-up appointment 10/07/2022 with cardiology, Dr. Winifred Olive which I reiterated the importance of keeping, pending BNP, CMP, chest xray.

## 2022-10-05 NOTE — Progress Notes (Signed)
Assessment & Plan:  Weakness Assessment & Plan: Walking with patient up and down the hall.  Patient was not short of breath.  Gait wide-based.  Gait not ataxic.  Strongly advised patient to  proceed to the emergency room for stat neuroimaging to rule out CVA.  He declines as he feels 'perfectly fine right now'. Previous symptoms of "feeling drunk" has resolved.  I have ordered stat MRI brain wo contrast.  Insurance required prior authorization which will be obtained by referral coordinator.  Patient would like MRI brain scheduled tomorrow.  Strict return precautions and advised patient if episode were to recur as he felt this morning, he is not to delay care and to call 911 or report to nearest emergency room.  Patient and wife verbalized understanding.   Orders: -     Ambulatory referral to Physical Therapy -     MR BRAIN WO CONTRAST; Future  Leg swelling Assessment & Plan: No acute respiratory distress.Patient has slept on 2 pillows for years.  I am concerned in regards to 11 pound weight gain.  Advised patient to continue with Lasix 40 mg for now. Tomorrow advised him to take Lasix 40 mg and he may increase to 60 mg if swelling does not improve.  At that point, if swelling is not improving, I advised patient to go to the emergency room as he may need IV diuresis.  He does have a follow-up appointment 10/07/2022 with cardiology, Dr. Winifred Olive which I reiterated the importance of keeping, pending BNP, CMP, chest xray.   Orders: -     Brain natriuretic peptide; Future -     DG Chest 2 View -     Comprehensive metabolic panel; Future  Anxiety and depression -     Sertraline HCl; Take 1 tablet (50 mg total) by mouth at bedtime.  Dispense: 90 tablet; Refill: 3     Return precautions given.   Risks, benefits, and alternatives of the medications and treatment plan prescribed today were discussed, and patient expressed understanding.   Education regarding symptom management and diagnosis  given to patient on AVS either electronically or printed.  Return in about 1 week (around 10/12/2022).  [name], FNP  Subjective:    Patient ID: Todd Clay, male    DOB: 1945-12-16, 77 y.o.   MRN: [id number]  CC: Todd Clay is a 77 y.o. male who presents today for an acute visit.    HPI: Accompanied by his wife.  Concern for leg swelling, falling again  He had been doing well as a relates to falls, last fall 03/2022.   He had a fall this morning , he describes walking down the hall and he turned around in the hallway, he describes feeling 'drunk'. He hit his right arm on the banister and has scratch on right arm.  Bleeding has stopped. No head injury,  LOC, syncope.   He 'feels perfectly fine now'  He feels more sob when walking. He is sleeping with 2 pillows under him every night which is normal for him; he has done this for years.    No CP, left numbness, dizziness, vision changes, facial numbness.   Denies salt indiscretion  He took a dose of lasix 20mg  starting 3 days ago and then 40mg  yesterday. He has taken 20mg  lasix today.    He describes increased anxiety the past couple of days. He is no longer on zoloft. He would like to resume.   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             He is compliant with losartan 12.5 mg daily.  He is compliant with Coreg 6.25 mg daily Last seen 04/08/2022 Dr Harland German he was maintained on Coreg, losartan, aspirin, Plavix, Lipitor 80 mg  MRI head without contrast 08/18/21 negative for acute infarct  Crt 1.00  Echocardiogram 01/2021 - The left ventricular systolic function is overall normal, LVEF is  visually estimated at 50-55%.      Allergies: Patient has no known allergies. Current Outpatient Medications on File Prior to Visit  Medication Sig Dispense Refill   aspirin EC 81 MG tablet Take 81 mg by mouth daily. Swallow whole.     atorvastatin (LIPITOR) 80 MG tablet Take 1  tablet (80 mg total) by mouth daily. 90 tablet 1   blood glucose meter kit and supplies Dispense based on patient and insurance preference. Use up to four times daily as directed. (FOR ICD-10 E10.9, E11.9). 1 each 0   clopidogrel (PLAVIX) 75 MG tablet Take 1 tablet by mouth once daily 90 tablet 0   Continuous Blood Gluc Receiver (FREESTYLE LIBRE 14 DAY READER) DEVI USE TO TEST BLOOD SUGAR     furosemide (LASIX) 20 MG tablet TAKE 1 TABLET BY MOUTH ONCE DAILY AS NEEDED 90 tablet 0   gabapentin (NEURONTIN) 100 MG capsule Take 1 capsule (100 mg total) by mouth daily. 90 capsule 3   insulin regular (NOVOLIN R RELION) 100 units/mL injection Inject 0.06 mLs (6 Units total) into the skin 3 (three) times daily before meals. (Patient taking differently: Inject 4-6 Units into the skin 3 (three) times daily before meals. Sliding Scale) 10 mL 2   losartan (COZAAR) 25 MG tablet Take 1/2 (one-half) tablet by mouth once daily 45 tablet 1   mupirocin ointment (BACTROBAN) 2 % Apply 1 application  topically 2 (two) times daily. 22 g 2   oseltamivir (TAMIFLU) 75 MG capsule Take 1 capsule (75 mg total) by mouth 2 (two) times daily. 10 capsule 0   TRESIBA FLEXTOUCH 100 UNIT/ML SOPN FlexTouch Pen Inject 12 Units into the skin daily.  3   [DISCONTINUED] carvedilol (COREG) 6.25 MG tablet Take 6.25 mg by mouth 2 (two) times daily.     No current facility-administered medications on file prior to visit.    Review of Systems  Constitutional:  Negative for chills and fever.  Respiratory:  Positive for shortness of breath. Negative for cough.   Cardiovascular:  Positive for leg swelling. Negative for chest pain and palpitations.  Gastrointestinal:  Negative for nausea and vomiting.  Neurological:  Positive for weakness. Negative for dizziness and headaches (resolved).      Objective:    BP 126/78   Pulse 69   Temp 98 F (36.7 C) (Oral)   Ht 5\' 5"  (1.651 m)   Wt 190 lb 9.6 oz (86.5 kg)   SpO2 97%   BMI 31.72 kg/m    BP Readings from Last 3 Encounters:  10/05/22 126/78  06/01/22 (!) 133/51  05/13/22 136/84   Wt Readings from Last 3 Encounters:  10/05/22 190 lb 9.6 oz (86.5 kg)  05/13/22 179 lb 6.4 oz (81.4 kg)  05/03/22 181 lb 9.6 oz (82.4 kg)    Physical Exam Vitals reviewed.  Constitutional:      Appearance: He is well-developed.  HENT:     Right Ear: Hearing normal.     Left Ear: Hearing normal.     Mouth/Throat:  Pharynx: Uvula midline. No posterior oropharyngeal erythema.  Eyes:     General: Lids are normal. Lids are everted, no foreign bodies appreciated.     Conjunctiva/sclera: Conjunctivae normal.     Pupils: Pupils are equal, round, and reactive to light.     Comments: Normal fundus bilaterally.  Cardiovascular:     Rate and Rhythm: Regular rhythm.     Heart sounds: Normal heart sounds.     Comments: Bilateral pitting edema lower extremities.  Skin is taut, shiny.  No increased warmth, erythema.  No rash.  Skin is Pulmonary:     Effort: Pulmonary effort is normal. No respiratory distress.     Breath sounds: Normal breath sounds. No wheezing, rhonchi or rales.  Musculoskeletal:     Right lower leg: 2+ Edema present.     Left lower leg: 2+ Edema present.  Lymphadenopathy:     Head:     Right side of head: No submental, submandibular, tonsillar, preauricular, posterior auricular or occipital adenopathy.     Left side of head: No submental, submandibular, tonsillar, preauricular, posterior auricular or occipital adenopathy.     Cervical: No cervical adenopathy.  Skin:    General: Skin is warm and dry.  Neurological:     Mental Status: He is alert.     Cranial Nerves: No cranial nerve deficit.     Sensory: No sensory deficit.     Deep Tendon Reflexes:     Reflex Scores:      Bicep reflexes are 2+ on the right side and 2+ on the left side.      Patellar reflexes are 2+ on the right side and 2+ on the left side.    Comments: Grip equal and strong bilateral upper  extremities. Gait is wide based. Gait is not ataxic.Able to perform rapid alternating movement without difficulty.   BLE strength 5/5.   Psychiatric:        Speech: Speech normal.        Behavior: Behavior normal.

## 2022-10-06 ENCOUNTER — Ambulatory Visit
Admission: RE | Admit: 2022-10-06 | Discharge: 2022-10-06 | Disposition: A | Discharge status: Home / Self Care | Payer: Medicare PPO | Source: Ambulatory Visit | Attending: Family | Admitting: Family

## 2022-10-06 ENCOUNTER — Other Ambulatory Visit
Admission: RE | Admit: 2022-10-06 | Discharge: 2022-10-06 | Disposition: A | Discharge status: Home / Self Care | Payer: Medicare PPO | Source: Ambulatory Visit | Attending: *Deleted | Admitting: *Deleted

## 2022-10-06 DIAGNOSIS — M7989 Other specified soft tissue disorders: Secondary | ICD-10-CM | POA: Insufficient documentation

## 2022-10-06 DIAGNOSIS — R0601 Orthopnea: Secondary | ICD-10-CM | POA: Diagnosis not present

## 2022-10-06 DIAGNOSIS — R531 Weakness: Secondary | ICD-10-CM | POA: Diagnosis not present

## 2022-10-06 DIAGNOSIS — G9389 Other specified disorders of brain: Secondary | ICD-10-CM | POA: Diagnosis not present

## 2022-10-06 DIAGNOSIS — R519 Headache, unspecified: Secondary | ICD-10-CM | POA: Diagnosis not present

## 2022-10-06 LAB — COMPREHENSIVE METABOLIC PANEL
ALT: 37 U/L (ref 0–44)
AST: 48 U/L — ABNORMAL HIGH (ref 15–41)
Albumin: 3.5 g/dL (ref 3.5–5.0)
Alkaline Phosphatase: 101 U/L (ref 38–126)
Anion gap: 6 (ref 5–15)
BUN: 24 mg/dL — ABNORMAL HIGH (ref 8–23)
CO2: 29 mmol/L (ref 22–32)
Calcium: 8.4 mg/dL — ABNORMAL LOW (ref 8.9–10.3)
Chloride: 101 mmol/L (ref 98–111)
Creatinine, Ser: 1.25 mg/dL — ABNORMAL HIGH (ref 0.61–1.24)
GFR, Estimated: 60 mL/min — ABNORMAL LOW (ref >60)
Glucose, Bld: 150 mg/dL — ABNORMAL HIGH (ref 70–99)
Potassium: 4.2 mmol/L (ref 3.5–5.1)
Sodium: 136 mmol/L (ref 135–145)
Total Bilirubin: 0.6 mg/dL (ref 0.3–1.2)
Total Protein: 6.1 g/dL — ABNORMAL LOW (ref 6.5–8.1)

## 2022-10-06 LAB — BRAIN NATRIURETIC PEPTIDE: B Natriuretic Peptide: 440 pg/mL — ABNORMAL HIGH (ref 0.0–100.0)

## 2022-10-07 DIAGNOSIS — I25119 Atherosclerotic heart disease of native coronary artery with unspecified angina pectoris: Secondary | ICD-10-CM | POA: Diagnosis not present

## 2022-10-07 DIAGNOSIS — R600 Localized edema: Secondary | ICD-10-CM | POA: Diagnosis not present

## 2022-10-11 DIAGNOSIS — I110 Hypertensive heart disease with heart failure: Secondary | ICD-10-CM | POA: Diagnosis not present

## 2022-10-11 DIAGNOSIS — Z79899 Other long term (current) drug therapy: Secondary | ICD-10-CM | POA: Diagnosis not present

## 2022-10-11 DIAGNOSIS — Z794 Long term (current) use of insulin: Secondary | ICD-10-CM | POA: Diagnosis not present

## 2022-10-11 DIAGNOSIS — E109 Type 1 diabetes mellitus without complications: Secondary | ICD-10-CM | POA: Diagnosis not present

## 2022-10-11 DIAGNOSIS — Z7982 Long term (current) use of aspirin: Secondary | ICD-10-CM | POA: Diagnosis not present

## 2022-10-11 DIAGNOSIS — Z7902 Long term (current) use of antithrombotics/antiplatelets: Secondary | ICD-10-CM | POA: Diagnosis not present

## 2022-10-11 DIAGNOSIS — I25119 Atherosclerotic heart disease of native coronary artery with unspecified angina pectoris: Secondary | ICD-10-CM | POA: Diagnosis not present

## 2022-10-11 DIAGNOSIS — I5033 Acute on chronic diastolic (congestive) heart failure: Secondary | ICD-10-CM | POA: Diagnosis not present

## 2022-10-14 ENCOUNTER — Telehealth: Payer: Self-pay

## 2022-10-14 DIAGNOSIS — J329 Chronic sinusitis, unspecified: Secondary | ICD-10-CM

## 2022-10-14 NOTE — Telephone Encounter (Signed)
-----   Message from Allegra Grana, FNP sent at 10/14/2022 11:17 AM EDT ----- Call patient Patient has not viewed MyChart result note.  Please review my chart note in detail with patient.   Please let me know if questions

## 2022-10-17 NOTE — Telephone Encounter (Signed)
Spoke to Mrs Demetrius Charity pt's wife and informed her that referral for ENT has been placed and to call us back if you have not been contacted in a couple weeks

## 2022-10-17 NOTE — Telephone Encounter (Signed)
Call pt  ENT referral placed Let us know if you dont hear back within a week in regards to an appointment being scheduled.   So that you are aware, if you are [facility] MyChart user , please pay attention to your MyChart messages as you may receive a MyChart message with a phone number to call and schedule this test/appointment own your own from our referral coordinator. This is a new process so I do not want you to miss this message.  If you are not a MyChart user, you will receive a phone call.

## 2022-10-21 DIAGNOSIS — I5033 Acute on chronic diastolic (congestive) heart failure: Secondary | ICD-10-CM | POA: Diagnosis not present

## 2022-10-21 DIAGNOSIS — I25119 Atherosclerotic heart disease of native coronary artery with unspecified angina pectoris: Secondary | ICD-10-CM | POA: Diagnosis not present

## 2022-10-25 ENCOUNTER — Ambulatory Visit (INDEPENDENT_AMBULATORY_CARE_PROVIDER_SITE_OTHER): Payer: Medicare PPO | Admitting: Vascular Surgery

## 2022-10-25 ENCOUNTER — Encounter (INDEPENDENT_AMBULATORY_CARE_PROVIDER_SITE_OTHER): Payer: Medicare PPO

## 2022-11-01 ENCOUNTER — Ambulatory Visit (INDEPENDENT_AMBULATORY_CARE_PROVIDER_SITE_OTHER): Payer: Medicare PPO

## 2022-11-01 ENCOUNTER — Ambulatory Visit (INDEPENDENT_AMBULATORY_CARE_PROVIDER_SITE_OTHER): Payer: Medicare PPO | Admitting: Vascular Surgery

## 2022-11-01 DIAGNOSIS — I6523 Occlusion and stenosis of bilateral carotid arteries: Secondary | ICD-10-CM | POA: Diagnosis not present

## 2022-11-02 DIAGNOSIS — I110 Hypertensive heart disease with heart failure: Secondary | ICD-10-CM | POA: Diagnosis not present

## 2022-11-02 DIAGNOSIS — I25119 Atherosclerotic heart disease of native coronary artery with unspecified angina pectoris: Secondary | ICD-10-CM | POA: Diagnosis not present

## 2022-11-02 DIAGNOSIS — Z7902 Long term (current) use of antithrombotics/antiplatelets: Secondary | ICD-10-CM | POA: Diagnosis not present

## 2022-11-02 DIAGNOSIS — I5189 Other ill-defined heart diseases: Secondary | ICD-10-CM | POA: Diagnosis not present

## 2022-11-02 DIAGNOSIS — I5033 Acute on chronic diastolic (congestive) heart failure: Secondary | ICD-10-CM | POA: Diagnosis not present

## 2022-11-02 DIAGNOSIS — I493 Ventricular premature depolarization: Secondary | ICD-10-CM | POA: Diagnosis not present

## 2022-11-02 DIAGNOSIS — Z794 Long term (current) use of insulin: Secondary | ICD-10-CM | POA: Diagnosis not present

## 2022-11-02 DIAGNOSIS — E109 Type 1 diabetes mellitus without complications: Secondary | ICD-10-CM | POA: Diagnosis not present

## 2022-11-02 DIAGNOSIS — I517 Cardiomegaly: Secondary | ICD-10-CM | POA: Diagnosis not present

## 2022-11-02 DIAGNOSIS — I340 Nonrheumatic mitral (valve) insufficiency: Secondary | ICD-10-CM | POA: Diagnosis not present

## 2022-11-02 DIAGNOSIS — Z7982 Long term (current) use of aspirin: Secondary | ICD-10-CM | POA: Diagnosis not present

## 2022-11-12 ENCOUNTER — Encounter: Payer: Self-pay | Admitting: Pulmonary Disease

## 2022-11-17 DIAGNOSIS — I493 Ventricular premature depolarization: Secondary | ICD-10-CM | POA: Diagnosis not present

## 2022-11-19 DIAGNOSIS — I493 Ventricular premature depolarization: Secondary | ICD-10-CM | POA: Diagnosis not present

## 2022-11-21 ENCOUNTER — Ambulatory Visit: Payer: Medicare PPO

## 2022-11-21 DIAGNOSIS — J339 Nasal polyp, unspecified: Secondary | ICD-10-CM | POA: Diagnosis not present

## 2022-11-21 DIAGNOSIS — J301 Allergic rhinitis due to pollen: Secondary | ICD-10-CM | POA: Diagnosis not present

## 2022-11-22 ENCOUNTER — Telehealth: Payer: Self-pay | Admitting: Family

## 2022-11-22 NOTE — Telephone Encounter (Signed)
Pt wife called in stating that she will like to speak to [facility] CMA concerning her husband.

## 2022-11-22 NOTE — Telephone Encounter (Signed)
Spoke to pt and he would like to know if he can take Meclizine 12.5 mg with his other medications for his dizziness?

## 2022-11-23 ENCOUNTER — Ambulatory Visit: Payer: Medicare PPO

## 2022-11-23 NOTE — Telephone Encounter (Signed)
Spoke to Mrs Todd Clay pt wife and she stated that he is not having any new issues that was the brother in law and him talking and brother suggested he should take one of his Meclizine pills because he was having similar symptoms. No need for in office visit.

## 2022-11-23 NOTE — Telephone Encounter (Signed)
noted 

## 2022-11-23 NOTE — Telephone Encounter (Signed)
Call and triage What is going on in regards to dizziness?  Does he feel the room is spinning?   Does he have vision changes, sinus congestion, hearing loss, ringing in the ear, headache, confusion?   I am concerned patient may need an appointment   Meclizine is for vertigo which is more inner ear problem with the room spinning. This is different than lightheadedness    BP Readings from Last 3 Encounters:  10/05/22 126/78  06/01/22 (!) 133/51  05/13/22 136/84

## 2022-12-05 ENCOUNTER — Ambulatory Visit: Payer: Medicare PPO | Attending: Family

## 2022-12-05 DIAGNOSIS — E785 Hyperlipidemia, unspecified: Secondary | ICD-10-CM | POA: Diagnosis not present

## 2022-12-05 DIAGNOSIS — R079 Chest pain, unspecified: Secondary | ICD-10-CM | POA: Diagnosis not present

## 2022-12-05 DIAGNOSIS — I2510 Atherosclerotic heart disease of native coronary artery without angina pectoris: Secondary | ICD-10-CM | POA: Diagnosis not present

## 2022-12-05 DIAGNOSIS — I340 Nonrheumatic mitral (valve) insufficiency: Secondary | ICD-10-CM | POA: Diagnosis not present

## 2022-12-05 DIAGNOSIS — I517 Cardiomegaly: Secondary | ICD-10-CM | POA: Diagnosis not present

## 2022-12-05 DIAGNOSIS — G4733 Obstructive sleep apnea (adult) (pediatric): Secondary | ICD-10-CM | POA: Diagnosis not present

## 2022-12-05 DIAGNOSIS — I70203 Unspecified atherosclerosis of native arteries of extremities, bilateral legs: Secondary | ICD-10-CM | POA: Diagnosis not present

## 2022-12-05 DIAGNOSIS — E1051 Type 1 diabetes mellitus with diabetic peripheral angiopathy without gangrene: Secondary | ICD-10-CM | POA: Diagnosis not present

## 2022-12-05 DIAGNOSIS — I110 Hypertensive heart disease with heart failure: Secondary | ICD-10-CM | POA: Diagnosis not present

## 2022-12-05 DIAGNOSIS — I214 Non-ST elevation (NSTEMI) myocardial infarction: Secondary | ICD-10-CM | POA: Diagnosis not present

## 2022-12-05 DIAGNOSIS — Z794 Long term (current) use of insulin: Secondary | ICD-10-CM | POA: Diagnosis not present

## 2022-12-05 DIAGNOSIS — Z955 Presence of coronary angioplasty implant and graft: Secondary | ICD-10-CM | POA: Diagnosis not present

## 2022-12-05 DIAGNOSIS — I5040 Unspecified combined systolic (congestive) and diastolic (congestive) heart failure: Secondary | ICD-10-CM | POA: Diagnosis not present

## 2022-12-05 DIAGNOSIS — J90 Pleural effusion, not elsewhere classified: Secondary | ICD-10-CM | POA: Diagnosis not present

## 2022-12-05 DIAGNOSIS — Z951 Presence of aortocoronary bypass graft: Secondary | ICD-10-CM | POA: Diagnosis not present

## 2022-12-05 DIAGNOSIS — I5042 Chronic combined systolic (congestive) and diastolic (congestive) heart failure: Secondary | ICD-10-CM | POA: Diagnosis not present

## 2022-12-05 DIAGNOSIS — I440 Atrioventricular block, first degree: Secondary | ICD-10-CM | POA: Diagnosis not present

## 2022-12-05 DIAGNOSIS — R0789 Other chest pain: Secondary | ICD-10-CM | POA: Diagnosis not present

## 2022-12-07 ENCOUNTER — Telehealth: Payer: Self-pay | Admitting: Family

## 2022-12-07 NOTE — Telephone Encounter (Signed)
[facility] called in stating that pt its admitted at their Cardiology dept and will go home today. However, they will like for provider to do some blood work for pt for Friday. He stated the orders that he would like for pt are in the after care visit care instructions.

## 2022-12-07 NOTE — Telephone Encounter (Signed)
Spoke to Mrs Todd Clay and scheduled a HFU on Friday 12/09/22

## 2022-12-08 DIAGNOSIS — I214 Non-ST elevation (NSTEMI) myocardial infarction: Secondary | ICD-10-CM | POA: Diagnosis not present

## 2022-12-09 ENCOUNTER — Ambulatory Visit (INDEPENDENT_AMBULATORY_CARE_PROVIDER_SITE_OTHER): Payer: Medicare PPO | Admitting: Family

## 2022-12-09 ENCOUNTER — Encounter: Payer: Self-pay | Admitting: *Deleted

## 2022-12-09 ENCOUNTER — Telehealth: Payer: Self-pay | Admitting: *Deleted

## 2022-12-09 ENCOUNTER — Encounter: Payer: Self-pay | Admitting: Family

## 2022-12-09 VITALS — BP 110/50 | HR 67 | Temp 97.7°F | Resp 17 | Ht 65.0 in | Wt 182.8 lb

## 2022-12-09 DIAGNOSIS — F419 Anxiety disorder, unspecified: Secondary | ICD-10-CM | POA: Diagnosis not present

## 2022-12-09 DIAGNOSIS — I25119 Atherosclerotic heart disease of native coronary artery with unspecified angina pectoris: Secondary | ICD-10-CM | POA: Diagnosis not present

## 2022-12-09 DIAGNOSIS — F32A Depression, unspecified: Secondary | ICD-10-CM | POA: Diagnosis not present

## 2022-12-09 LAB — BASIC METABOLIC PANEL
BUN: 26 mg/dL — ABNORMAL HIGH (ref 6–23)
CO2: 25 mEq/L (ref 19–32)
Calcium: 8.9 mg/dL (ref 8.4–10.5)
Chloride: 104 mEq/L (ref 96–112)
Creatinine, Ser: 1.19 mg/dL (ref 0.40–1.50)
GFR: 59.26 mL/min — ABNORMAL LOW (ref >60.00)
Glucose, Bld: 132 mg/dL — ABNORMAL HIGH (ref 70–99)
Potassium: 4.3 mEq/L (ref 3.5–5.1)
Sodium: 136 mEq/L (ref 135–145)

## 2022-12-09 MED ORDER — METOPROLOL SUCCINATE ER 25 MG PO TB24: 25.0000 mg | ORAL_TABLET | Freq: Every day | ORAL | Status: DC

## 2022-12-09 MED ORDER — ALPRAZOLAM 0.25 MG PO TABS
0.2500 mg | ORAL_TABLET | Freq: Every day | ORAL | 0 refills | Status: DC | PRN
Start: 2022-12-09 — End: 2023-01-13

## 2022-12-09 NOTE — Assessment & Plan Note (Signed)
Reviewed hospitalization course. Medications reconciled. He is no longer on viagra, gabapentin 800mg . Advised to continue aspirin 81 mg daily, Plavix 75 mg daily, atorvastatin 80 mg daily, metoprolol 25 mg daily, losartan 12.5 mg daily.  He has follow up with Dr Harland German and I have advised to discuss the role of cardiac rehab. Will follow. Pending BMP lab.

## 2022-12-09 NOTE — Progress Notes (Signed)
Assessment & Plan:  Coronary artery disease involving native coronary artery of native heart with angina pectoris [facility]) Assessment & Plan: Reviewed hospitalization course. Medications reconciled. He is no longer on viagra, gabapentin 800mg . Advised to continue aspirin 81 mg daily, Plavix 75 mg daily, atorvastatin 80 mg daily, metoprolol 25 mg daily, losartan 12.5 mg daily.  He has follow up with Dr Harland German and I have advised to discuss the role of cardiac rehab. Will follow. Pending BMP lab.   Orders: -     Basic metabolic panel  Anxiety and depression Assessment & Plan: Episodic, particularly round sleep.  I provided him with Xanax 0.25 mg to use sparingly.  Counseled on side effects, sedation.  Orders: -     ALPRAZolam; Take 1 tablet (0.25 mg total) by mouth daily as needed for anxiety.  Dispense: 20 tablet; Refill: 0  Other orders -     Metoprolol Succinate ER; Take 1 tablet (25 mg total) by mouth daily.     Return precautions given.   Risks, benefits, and alternatives of the medications and treatment plan prescribed today were discussed, and patient expressed understanding.   Education regarding symptom management and diagnosis given to patient on AVS either electronically or printed.  Return in about 3 months (around 03/11/2023).  [name], FNP  Subjective:    Patient ID: Todd Clay, male    DOB: 05/30/1946, 77 y.o.   MRN: [id number]  CC: Todd Clay is a 77 y.o. male who presents today for follow up.   HPI: Hospital follow-up   Accompanied by wife  Overall feels well. Walking further now at home.         He is feeling very anxious, worse at bedtime. He would like medication to take as needed. He has taken xanax in the past.     No alcohol use    He ha yet to start  Cardiac rehab  compliant with ASA/Plavix for 12 months  Discontinued furosemide, gabapentin 800mg  , viagra  He is now taking gabapentin 100 mg at bedtime.     Cardiology follow up Dr Harland German  scheduled 12/23/22  Presented to [facility] left-sided chest pain.  Admitted 12/05/22 and discharged 12/07/22   Troponin elevated 2319, new t wave inversions. Found 90% stenosis in proximal RCA, 80% stenosis in proximal LCx, PCI performed.  Status post 2 coronary stents , first 12/05/22, and then another 12/06/22.    Hospital course significant for rising creatinine, potassium after catheterization Combined systolic and diastolic heart failure.  Given IV Lasix.  Discharge on torsemide 20 mg   Continued on aspirin 81 mg daily, Plavix 75 mg daily, atorvastatin 80 mg daily, metoprolol 25 mg daily, losartan 12.5 mg daily Echocardiogram 12/07/2022 left ventricular ejection fraction 35 to 40%. Crt 1.21    Allergies: Patient has no known allergies. Current Outpatient Medications on File Prior to Visit  Medication Sig Dispense Refill   aspirin EC 81 MG tablet Take 81 mg by mouth daily. Swallow whole.     atorvastatin (LIPITOR) 80 MG tablet Take 1 tablet (80 mg total) by mouth daily. 90 tablet 1   blood glucose meter kit and supplies Dispense based on patient and insurance preference. Use up to four times daily as directed. (FOR ICD-10 E10.9, E11.9). 1 each 0   clopidogrel (PLAVIX) 75 MG tablet Take 1 tablet by mouth once daily 90 tablet 0   Continuous Blood Gluc Receiver (FREESTYLE LIBRE 14 DAY READER) DEVI USE TO TEST BLOOD SUGAR  gabapentin (NEURONTIN) 100 MG capsule Take 1 capsule (100 mg total) by mouth daily. 90 capsule 3   insulin regular (NOVOLIN R RELION) 100 units/mL injection Inject 0.06 mLs (6 Units total) into the skin 3 (three) times daily before meals. (Patient taking differently: Inject 4-6 Units into the skin 3 (three) times daily before meals. Sliding Scale) 10 mL 2   losartan (COZAAR) 25 MG tablet Take 1/2 (one-half) tablet by mouth once daily 45 tablet 1   mupirocin ointment (BACTROBAN) 2 % Apply 1 application  topically 2 (two) times daily. 22 g 2    TRESIBA FLEXTOUCH 100 UNIT/ML SOPN FlexTouch Pen Inject 12 Units into the skin daily.  3   [DISCONTINUED] carvedilol (COREG) 6.25 MG tablet Take 6.25 mg by mouth 2 (two) times daily.     No current facility-administered medications on file prior to visit.    Review of Systems  Constitutional:  Negative for chills and fever.  HENT:  Negative for congestion, ear pain, rhinorrhea, sinus pressure and sore throat.   Respiratory:  Negative for cough, shortness of breath and wheezing.   Cardiovascular:  Negative for chest pain and palpitations.  Gastrointestinal:  Negative for diarrhea, nausea and vomiting.  Genitourinary:  Negative for dysuria.  Musculoskeletal:  Negative for myalgias.  Skin:  Negative for rash.  Neurological:  Negative for headaches.  Hematological:  Negative for adenopathy.      Objective:    BP (!) 110/50   Pulse 67   Temp 97.7 F (36.5 C) (Oral)   Resp 17   Ht 5\' 5"  (1.651 m)   Wt 182 lb 12 oz (82.9 kg)   SpO2 95%   BMI 30.41 kg/m  BP Readings from Last 3 Encounters:  12/09/22 (!) 110/50  10/05/22 126/78  06/01/22 (!) 133/51   Wt Readings from Last 3 Encounters:  12/09/22 182 lb 12 oz (82.9 kg)  10/05/22 190 lb 9.6 oz (86.5 kg)  05/13/22 179 lb 6.4 oz (81.4 kg)    Physical Exam Vitals reviewed.  Constitutional:      Appearance: He is well-developed.  Cardiovascular:     Rate and Rhythm: Regular rhythm.     Heart sounds: Normal heart sounds.  Pulmonary:     Effort: Pulmonary effort is normal. No respiratory distress.     Breath sounds: Normal breath sounds. No wheezing, rhonchi or rales.  Skin:    General: Skin is warm and dry.  Neurological:     Mental Status: He is alert.  Psychiatric:        Speech: Speech normal.        Behavior: Behavior normal.

## 2022-12-09 NOTE — Assessment & Plan Note (Signed)
Episodic, particularly round sleep.  I provided him with Xanax 0.25 mg to use sparingly.  Counseled on side effects, sedation.

## 2022-12-09 NOTE — Patient Instructions (Addendum)
Please speak with Dr Harland German regarding cardiac rehab  Please review medication list and ensure that it is correct.    Nice to see you

## 2022-12-09 NOTE — Transitions of Care (Post Inpatient/ED Visit) (Signed)
12/09/2022  Name: Todd Clay MRN: [id number] DOB: [birth year]  Today's TOC FU Call Status: Today's TOC FU Call Status:: Successful TOC FU Call Competed TOC FU Call Complete Date: 12/09/22  Transition Care Management Follow-up Telephone Call Date of Discharge: 12/07/22 Discharge Facility: Other ([facility]) Name of Other ([facility]) Discharge Facility: [facility] Type of Discharge: Inpatient Admission Primary Inpatient Discharge Diagnosis:: NSTEMI with PCI How have you been since you were released from the hospital?: Better (Per spouse: "He seems to be much better after having had the stents placed at [facility]- he said he is feeling more like himself than he has in a long time.  He does not weigh consistently at home because his balance is off standing on scale") Any questions or concerns?: No  Items Reviewed: Did you receive and understand the discharge instructions provided?: Yes (briefly reviewed with patient who verbalizes good understanding of same - outside hospital AVS) Medications obtained,verified, and reconciled?: Yes (Medications Reviewed) (Full medication reconciliation/ review completed; no concerns or discrepancies identified; confirmed patient obtained/ is taking all newly Rx'd medications as instructed; self-manages medications and denies questions/ concerns around medications today) Any new allergies since your discharge?: No Dietary orders reviewed?: Yes Type of Diet Ordered:: Heart Healthy Low salt Do you have support at home?: Yes People in Home: spouse Name of Support/Comfort Primary Source: Reports essentially independent in self-care activities; supportive spouse assists as/ if needed/ indicated  Medications Reviewed Today: Medications Reviewed Today     Reviewed by Michaela Corner, RN (Registered Nurse) on 12/09/22 at 1019  Med List Status: <None>   Medication Order Taking? Sig Documenting Provider Last Dose Status Informant  aspirin EC 81 MG tablet  [id number] Yes Take 81 mg by mouth daily. Swallow whole. [provider] Taking Active Spouse/Significant Other  atorvastatin (LIPITOR) 80 MG tablet [id number] Yes Take 1 tablet (80 mg total) by mouth daily. Allegra Grana, FNP Taking Active   blood glucose meter kit and supplies [id number] Yes Dispense based on patient and insurance preference. Use up to four times daily as directed. (FOR ICD-10 E10.9, E11.9). Allegra Grana, FNP Taking Active     Discontinued 07/12/21 0929   clopidogrel (PLAVIX) 75 MG tablet [id number] Yes Take 1 tablet by mouth once daily Gollan, Tollie Pizza, MD Taking Active Spouse/Significant Other  Continuous Blood Gluc Receiver (FREESTYLE LIBRE 14 DAY READER) DEVI [id number] Yes USE TO TEST BLOOD SUGAR [provider] Taking Active Spouse/Significant Other  furosemide (LASIX) 20 MG tablet [id number] No TAKE 1 TABLET BY MOUTH ONCE DAILY AS NEEDED  Patient not taking: Reported on 12/09/2022   Allegra Grana, FNP Not Taking Active            Med Note Michaela Corner   Fri Dec 09, 2022 10:16 AM) 12/09/22: Reports during TOC call, this medication was discontinued at time of hospital discharge on 12/09/22- currently not taking according to hospital discharge instructions  gabapentin (NEURONTIN) 100 MG capsule [id number] Yes Take 1 capsule (100 mg total) by mouth daily. Allegra Grana, FNP Taking Active   insulin regular (NOVOLIN R RELION) 100 units/mL injection [id number] Yes Inject 0.06 mLs (6 Units total) into the skin 3 (three) times daily before meals.  Patient taking differently: Inject 4-6 Units into the skin 3 (three) times daily before meals. Sliding Scale   Phadke, Janina Mayo, MD Taking Active Spouse/Significant Other  losartan (COZAAR) 25 MG tablet [id number] Yes Take 1/2 (one-half) tablet by mouth once daily [name]  G, FNP Taking Active   mupirocin ointment (BACTROBAN) 2 % [id number] Yes Apply 1 application  topically 2 (two) times daily.  Allegra Grana, FNP Taking Active   oseltamivir (TAMIFLU) 75 MG capsule [id number] No Take 1 capsule (75 mg total) by mouth 2 (two) times daily.  Patient not taking: Reported on 12/09/2022   Allegra Grana, FNP Not Taking Active   sertraline (ZOLOFT) 50 MG tablet [id number] No Take 1 tablet (50 mg total) by mouth at bedtime.  Patient not taking: Reported on 12/09/2022   Allegra Grana, FNP Not Taking Active   torsemide (DEMADEX) 20 MG tablet [id number] Yes Take 20 mg by mouth as needed. 12/09/22: reports during [facility] call this was added at time of hospital discharge from [facility] on 12/07/22: instructed to take prn for weight gain > 3-5 lbs overnight Allegra Grana, FNP Taking Active Self           Med Note Michaela Corner   Fri Dec 09, 2022 10:19 AM) 12/09/22: reports during [facility] call this was added at time of hospital discharge from [facility] on 12/07/22: instructed to take prn for weight gain > 3-5 lbs overnight-- instructed not to take until after he has lab work for [employer] completed on 12/09/22   TRESIBA FLEXTOUCH 100 UNIT/ML SOPN FlexTouch Pen [id number] Yes Inject 12 Units into the skin daily. [provider] Taking Active Spouse/Significant Other           Home Care and Equipment/Supplies: Were Home Health Services Ordered?: No Any new equipment or medical supplies ordered?: No  Functional Questionnaire: Do you need assistance with bathing/showering or dressing?: No Do you need assistance with meal preparation?: No (wife assists as indicated) Do you need assistance with eating?: No Do you have difficulty maintaining continence: No Do you need assistance with getting out of bed/getting out of a chair/moving?: No Do you have difficulty managing or taking your medications?: No (wife assists as indicated)  Follow up appointments reviewed: PCP Follow-up appointment confirmed?: Yes Date of PCP follow-up appointment?: 12/09/22 Follow-up Provider: PCP Specialist Hospital Follow-up  appointment confirmed?: Yes Date of Specialist follow-up appointment?: 12/23/22 Follow-Up Specialty Provider:: cardiology provider at [facility] Do you need transportation to your follow-up appointment?: No Do you understand care options if your condition(s) worsen?: Yes-patient verbalized understanding  SDOH Interventions Today    Flowsheet Row Most Recent Value  SDOH Interventions   Food Insecurity Interventions Intervention Not Indicated  Transportation Interventions Intervention Not Indicated  [spouse currently provides transportation]      TOC Interventions Today    Flowsheet Row Most Recent Value  TOC Interventions   TOC Interventions Discussed/Reviewed TOC Interventions Discussed      Interventions Today    Flowsheet Row Most Recent Value  Chronic Disease   Chronic disease during today's visit Congestive Heart Failure (CHF), Other  [NSTEMI with PCI]  General Interventions   General Interventions Discussed/Reviewed General Interventions Discussed, Durable Medical Equipment (DME), Doctor Visits  Doctor Visits Discussed/Reviewed Doctor Visits Discussed, PCP, Specialist  Durable Medical Equipment (DME) Other  [confirmed not currently requiring/ using assistive devices, however spouse reports has had multiple falls without injury- education provided around value of considering using assistive devices,  fall assessment completed]  PCP/Specialist Visits Compliance with follow-up visit  Education Interventions   Education Provided Provided Education  Provided Verbal Education On Other, Medication  [benefit of considering using assistive devices in setting of high fall risk,  rationale for daily weight monitoring at home along with  weight gain guidelines/ action plan for weight gain,  importance of taking diuretic as prescribed]  Nutrition Interventions   Nutrition Discussed/Reviewed Nutrition Discussed  Pharmacy Interventions   Pharmacy Dicussed/Reviewed Pharmacy Topics Discussed   [Full medication review with updating medication list in EHR per patient's spouse report]  Safety Interventions   Safety Discussed/Reviewed Safety Discussed, Fall Risk      Caryl Pina, RN, BSN, CCRN Alumnus RN CM Care Coordination/ Transition of Care- [facility] Care Management [phone]: direct office

## 2022-12-12 ENCOUNTER — Encounter: Payer: Self-pay | Admitting: Family

## 2022-12-12 NOTE — Telephone Encounter (Signed)
Spoke to Todd Clay and explained to her that yes it was done and went over with her

## 2022-12-14 ENCOUNTER — Telehealth: Payer: Self-pay | Admitting: Family

## 2022-12-14 DIAGNOSIS — R9389 Abnormal findings on diagnostic imaging of other specified body structures: Secondary | ICD-10-CM

## 2022-12-14 NOTE — Telephone Encounter (Signed)
LVM for pt to call back and  inform pt that we have reordered the chest x-ray . Please have patient schedule this in our office in 6 weeks

## 2022-12-14 NOTE — Telephone Encounter (Signed)
Call pt  Reviewing hospitalization visit in further detail.  Incidental finding of a very small left pleural effusion noted on chest x-ray 12/05/22  I have reordered the chest x-ray to follow.  Please have patient schedule this in our office in 6 weeks

## 2022-12-14 NOTE — Telephone Encounter (Signed)
Pt spouse return [facility] CMA call. Note below was read to her. She's aware. Pt its book for x-ray for 01/25/23.

## 2022-12-14 NOTE — Telephone Encounter (Signed)
 Noted  

## 2022-12-18 DIAGNOSIS — E1042 Type 1 diabetes mellitus with diabetic polyneuropathy: Secondary | ICD-10-CM | POA: Diagnosis not present

## 2022-12-20 ENCOUNTER — Telehealth: Payer: Self-pay | Admitting: Family

## 2022-12-20 ENCOUNTER — Ambulatory Visit: Payer: Medicare PPO | Admitting: Family

## 2022-12-20 ENCOUNTER — Encounter: Payer: Self-pay | Admitting: Family

## 2022-12-20 VITALS — BP 116/78 | HR 64 | Temp 97.7°F | Ht 64.0 in | Wt 176.6 lb

## 2022-12-20 DIAGNOSIS — F32A Depression, unspecified: Secondary | ICD-10-CM

## 2022-12-20 DIAGNOSIS — F419 Anxiety disorder, unspecified: Secondary | ICD-10-CM

## 2022-12-20 DIAGNOSIS — R5383 Other fatigue: Secondary | ICD-10-CM | POA: Diagnosis not present

## 2022-12-20 NOTE — Telephone Encounter (Signed)
Call [facility] German cardiology at [facility] and speak to nurse  [phone] (Work)    I would like to know if okay with referral to cardiac rehab here in [place]

## 2022-12-20 NOTE — Assessment & Plan Note (Addendum)
Acute on chronic.  Discussed lack of exercise.  Recommended cardiac rehabilitation.  Call out to cardiology to ensure Dr Harland German agrees.  Discussed melatonin.  He declines medication to aid in sleep.  Reiterated the importance of treatment of sleep apnea.  He politely declines OSA treatment.Will follow.

## 2022-12-20 NOTE — Progress Notes (Signed)
Assessment & Plan:  Other fatigue Assessment & Plan: Acute on chronic.  Discussed lack of exercise.  Recommended cardiac rehabilitation.  Call out to cardiology to ensure Dr Harland German agrees.  Discussed melatonin.  He declines medication to aid in sleep.  Reiterated the importance of treatment of sleep apnea.  He politely declines OSA treatment.Will follow.    Anxiety and depression Assessment & Plan: Counseled him on risk of taking Xanax 0.25 mg daily.  I reiterated that I prescribed this medication to be used infrequently and on an as needed basis.  Discussed restarting Zoloft or potentially Prozac if anxiety is not well-controlled      Return precautions given.   Risks, benefits, and alternatives of the medications and treatment plan prescribed today were discussed, and patient expressed understanding.   Education regarding symptom management and diagnosis given to patient on AVS either electronically or printed.  Return in about 6 months (around 06/22/2023).  [name], FNP  Subjective:    Patient ID: Todd Clay, male    DOB: December 19, 1945, 77 y.o.   MRN: [id number]  CC: Todd Clay is a 77 y.o. male who presents today for follow up.   HPI: Feels well today.  He describes 'some mornings feeling more tired'  Sleep is normally restorative. He does report nights where he wakes up multiple times at night.  He declines treatment for OSA.   No regular exercise.   NO sob, leg swelling, CP, nocturia.   He has been taking Xanax 0.25 mg every afternoon.  It has been helpful for him   Status post 2 coronary stents , first 12/05/22, and then another 12/06/22.   Compliant with torsemide 20 mg, aspirin 81 mg daily, Plavix 75 mg daily, atorvastatin 80 mg daily, metoprolol 25 mg daily, losartan 12.5 mg daily  He has follow-up cardiology 12/23/2022, Dr Harland German Allergies: Patient has no known allergies. Current Outpatient Medications on File Prior to Visit  Medication Sig  Dispense Refill   ALPRAZolam (XANAX) 0.25 MG tablet Take 1 tablet (0.25 mg total) by mouth daily as needed for anxiety. 20 tablet 0   aspirin EC 81 MG tablet Take 81 mg by mouth daily. Swallow whole.     atorvastatin (LIPITOR) 80 MG tablet Take 1 tablet (80 mg total) by mouth daily. 90 tablet 1   blood glucose meter kit and supplies Dispense based on patient and insurance preference. Use up to four times daily as directed. (FOR ICD-10 E10.9, E11.9). 1 each 0   clopidogrel (PLAVIX) 75 MG tablet Take 1 tablet by mouth once daily 90 tablet 0   Continuous Blood Gluc Receiver (FREESTYLE LIBRE 14 DAY READER) DEVI USE TO TEST BLOOD SUGAR     gabapentin (NEURONTIN) 100 MG capsule Take 1 capsule (100 mg total) by mouth daily. 90 capsule 3   insulin regular (NOVOLIN R RELION) 100 units/mL injection Inject 0.06 mLs (6 Units total) into the skin 3 (three) times daily before meals. (Patient taking differently: Inject 4-6 Units into the skin 3 (three) times daily before meals. Sliding Scale) 10 mL 2   losartan (COZAAR) 25 MG tablet Take 1/2 (one-half) tablet by mouth once daily 45 tablet 1   metoprolol succinate (TOPROL-XL) 25 MG 24 hr tablet Take 1 tablet (25 mg total) by mouth daily.     mupirocin ointment (BACTROBAN) 2 % Apply 1 application  topically 2 (two) times daily. 22 g 2   torsemide (DEMADEX) 20 MG tablet Take 20 mg by mouth as  needed. 12/09/22: reports during [facility] call this was added at time of hospital discharge from [facility] on 12/07/22: instructed to take prn for weight gain > 3-5 lbs overnight     TRESIBA FLEXTOUCH 100 UNIT/ML SOPN FlexTouch Pen Inject 12 Units into the skin daily.  3   [DISCONTINUED] carvedilol (COREG) 6.25 MG tablet Take 6.25 mg by mouth 2 (two) times daily.     No current facility-administered medications on file prior to visit.    Review of Systems  Constitutional:  Negative for chills and fever.  Respiratory:  Negative for cough and shortness of breath.   Cardiovascular:   Negative for chest pain, palpitations and leg swelling.  Gastrointestinal:  Negative for nausea and vomiting.  Psychiatric/Behavioral:  Positive for sleep disturbance. The patient is nervous/anxious.       Objective:    BP 116/78   Pulse 64   Temp 97.7 F (36.5 C) (Oral)   Ht 5\' 4"  (1.626 m)   Wt 176 lb 9.6 oz (80.1 kg)   SpO2 96%   BMI 30.31 kg/m  BP Readings from Last 3 Encounters:  12/20/22 116/78  12/09/22 (!) 110/50  10/05/22 126/78   Wt Readings from Last 3 Encounters:  12/20/22 176 lb 9.6 oz (80.1 kg)  12/09/22 182 lb 12 oz (82.9 kg)  10/05/22 190 lb 9.6 oz (86.5 kg)    Physical Exam Vitals reviewed.  Constitutional:      Appearance: He is well-developed.  Cardiovascular:     Rate and Rhythm: Regular rhythm.     Heart sounds: Normal heart sounds.  Pulmonary:     Effort: Pulmonary effort is normal. No respiratory distress.     Breath sounds: Normal breath sounds. No wheezing, rhonchi or rales.  Musculoskeletal:     Right lower leg: No edema.     Left lower leg: No edema.  Skin:    General: Skin is warm and dry.  Neurological:     Mental Status: He is alert.  Psychiatric:        Speech: Speech normal.        Behavior: Behavior normal.

## 2022-12-20 NOTE — Telephone Encounter (Signed)
Spoke to Lucas at Dr Harland German office and she stated that yes it would be fine and she was faxing over info to them

## 2022-12-20 NOTE — Telephone Encounter (Signed)
Called Dr Harland German office and had to LVM to get someone to call me back. Number was given to return call pt MRN was given also

## 2022-12-20 NOTE — Assessment & Plan Note (Signed)
Counseled him on risk of taking Xanax 0.25 mg daily.  I reiterated that I prescribed this medication to be used infrequently and on an as needed basis.  Discussed restarting Zoloft or potentially Prozac if anxiety is not well-controlled

## 2022-12-20 NOTE — Patient Instructions (Addendum)
Lets focus on exercise to help with sleep quality.   I will reach out to cardiology regarding Cardiac rehab.  As discussed, please be cautious with Xanax and use sparingly.

## 2022-12-23 DIAGNOSIS — I110 Hypertensive heart disease with heart failure: Secondary | ICD-10-CM | POA: Diagnosis not present

## 2022-12-23 DIAGNOSIS — E1059 Type 1 diabetes mellitus with other circulatory complications: Secondary | ICD-10-CM | POA: Diagnosis not present

## 2022-12-23 DIAGNOSIS — I214 Non-ST elevation (NSTEMI) myocardial infarction: Secondary | ICD-10-CM | POA: Diagnosis not present

## 2022-12-23 DIAGNOSIS — I493 Ventricular premature depolarization: Secondary | ICD-10-CM | POA: Diagnosis not present

## 2022-12-23 DIAGNOSIS — E1051 Type 1 diabetes mellitus with diabetic peripheral angiopathy without gangrene: Secondary | ICD-10-CM | POA: Diagnosis not present

## 2022-12-23 DIAGNOSIS — E1022 Type 1 diabetes mellitus with diabetic chronic kidney disease: Secondary | ICD-10-CM | POA: Diagnosis not present

## 2022-12-23 DIAGNOSIS — E1040 Type 1 diabetes mellitus with diabetic neuropathy, unspecified: Secondary | ICD-10-CM | POA: Diagnosis not present

## 2022-12-23 DIAGNOSIS — M868X8 Other osteomyelitis, other site: Secondary | ICD-10-CM | POA: Diagnosis not present

## 2022-12-23 DIAGNOSIS — I5022 Chronic systolic (congestive) heart failure: Secondary | ICD-10-CM | POA: Diagnosis not present

## 2022-12-23 DIAGNOSIS — I5020 Unspecified systolic (congestive) heart failure: Secondary | ICD-10-CM | POA: Diagnosis not present

## 2022-12-23 DIAGNOSIS — I472 Ventricular tachycardia, unspecified: Secondary | ICD-10-CM | POA: Diagnosis not present

## 2022-12-23 NOTE — Telephone Encounter (Signed)
Spoke to Mrs Todd Clay pt wife and informed her that Dr Raymondo Band office was faxing the ok to  cardiac rehab  to [facility].Pt verbalized understanding

## 2022-12-23 NOTE — Telephone Encounter (Signed)
Please let pt know dr Harland German office faxing request for cardio rehab in [facility]   Let us know if you dont hear back within a week in regards to an appointment being scheduled.

## 2022-12-24 DIAGNOSIS — I214 Non-ST elevation (NSTEMI) myocardial infarction: Secondary | ICD-10-CM | POA: Diagnosis not present

## 2023-01-11 ENCOUNTER — Other Ambulatory Visit: Payer: Self-pay | Admitting: Family

## 2023-01-11 DIAGNOSIS — F419 Anxiety disorder, unspecified: Secondary | ICD-10-CM

## 2023-01-11 DIAGNOSIS — I25118 Atherosclerotic heart disease of native coronary artery with other forms of angina pectoris: Secondary | ICD-10-CM

## 2023-01-13 DIAGNOSIS — I493 Ventricular premature depolarization: Secondary | ICD-10-CM | POA: Diagnosis not present

## 2023-01-13 DIAGNOSIS — I5020 Unspecified systolic (congestive) heart failure: Secondary | ICD-10-CM | POA: Diagnosis not present

## 2023-01-25 ENCOUNTER — Other Ambulatory Visit: Payer: Medicare PPO

## 2023-03-02 ENCOUNTER — Other Ambulatory Visit: Payer: Self-pay | Admitting: Family

## 2023-03-02 DIAGNOSIS — I152 Hypertension secondary to endocrine disorders: Secondary | ICD-10-CM

## 2023-03-07 ENCOUNTER — Telehealth: Payer: Self-pay | Admitting: Family

## 2023-03-07 DIAGNOSIS — I951 Orthostatic hypotension: Secondary | ICD-10-CM

## 2023-03-07 DIAGNOSIS — I25118 Atherosclerotic heart disease of native coronary artery with other forms of angina pectoris: Secondary | ICD-10-CM

## 2023-03-07 DIAGNOSIS — I493 Ventricular premature depolarization: Secondary | ICD-10-CM

## 2023-03-07 NOTE — Telephone Encounter (Signed)
Prescription Request  03/07/2023  LOV: 12/20/2022  What is the name of the medication or equipment? PLAVIX  Have you contacted your pharmacy to request a refill? No   Which pharmacy would you like this sent to?  [facility] [address] [place] - [address] [place] [address] Phone: [phone] Fax: [phone]    Patient notified that their request is being sent to the clinical staff for review and that they should receive a response within 2 business days.   Please advise at Mobile [phone] (mobile)   Pt tried to call the cardiologist to fill but can not get in touch with them

## 2023-03-09 DIAGNOSIS — I493 Ventricular premature depolarization: Secondary | ICD-10-CM | POA: Insufficient documentation

## 2023-03-09 MED ORDER — CLOPIDOGREL BISULFATE 75 MG PO TABS
75.0000 mg | ORAL_TABLET | Freq: Every day | ORAL | 1 refills | Status: DC
Start: 2023-03-09 — End: 2023-10-12

## 2023-03-09 NOTE — Addendum Note (Signed)
Addended by: Allegra Grana on: 03/09/2023 08:15 AM   Modules accepted: Orders

## 2023-03-09 NOTE — Telephone Encounter (Signed)
Pt has been notified.

## 2023-03-31 ENCOUNTER — Telehealth: Payer: Self-pay | Admitting: Family

## 2023-03-31 DIAGNOSIS — R42 Dizziness and giddiness: Secondary | ICD-10-CM

## 2023-03-31 NOTE — Telephone Encounter (Signed)
I have ordered Please ask pt to come by to pick up rx to take to medical supply store

## 2023-03-31 NOTE — Telephone Encounter (Signed)
Patient's wife would like a prescription for a rolling walker with seat. Patient keeps falling.

## 2023-03-31 NOTE — Telephone Encounter (Signed)
Spoke to pt wife she will come by this afternoon to pick up Order for rolling walker with seat

## 2023-03-31 NOTE — Addendum Note (Signed)
Addended by: Allegra Grana on: 03/31/2023 11:02 AM   Modules accepted: Orders

## 2023-05-04 ENCOUNTER — Telehealth: Payer: Self-pay | Admitting: Family

## 2023-05-04 DIAGNOSIS — E1142 Type 2 diabetes mellitus with diabetic polyneuropathy: Secondary | ICD-10-CM

## 2023-05-04 MED ORDER — GABAPENTIN 100 MG PO CAPS: 100.0000 mg | ORAL_CAPSULE | Freq: Every day | ORAL | 3 refills | Status: DC

## 2023-05-04 NOTE — Telephone Encounter (Signed)
Medication sent in to pharmacy pt wife Mrs Demetrius Charity is aware

## 2023-05-04 NOTE — Telephone Encounter (Signed)
Prescription Request  05/04/2023  LOV: 12/20/2022  What is the name of the medication or equipment? gabapentin  Have you contacted your pharmacy to request a refill? No   Which pharmacy would you like this sent to?  [facility] [address] [place] - [address] [place] [address] Phone: [phone] Fax: [phone]    Patient notified that their request is being sent to the clinical staff for review and that they should receive a response within 2 business days.   Please advise at Mobile [phone] (mobile)

## 2023-07-16 ENCOUNTER — Other Ambulatory Visit: Payer: Self-pay | Admitting: Family

## 2023-07-16 DIAGNOSIS — F419 Anxiety disorder, unspecified: Secondary | ICD-10-CM

## 2023-09-04 ENCOUNTER — Other Ambulatory Visit: Payer: Self-pay | Admitting: Family

## 2023-09-04 MED ORDER — DEXCOM G7 SENSOR MISC: 3 refills | Status: AC

## 2023-09-04 NOTE — Telephone Encounter (Signed)
 Copied from CRM [id number]. Topic: Clinical - Medication Refill >> Sep 04, 2023  8:47 AM Florestine Avers wrote: Most Recent Primary Care Visit:  Provider: Allegra Grana  Department: [facility]  Visit Type: OFFICE VISIT  Date: 12/20/2022  Medication: DexconG7  Has the patient contacted their pharmacy? Yes (Agent: If no, request that the patient contact the pharmacy for the refill. If patient does not wish to contact the pharmacy document the reason why and proceed with request.) (Agent: If yes, when and what did the pharmacy advise?)  Is this the correct pharmacy for this prescription? Yes If no, delete pharmacy and type the correct one.  This is the patient's preferred pharmacy:  [facility] [address] [place] - [address] [place] [address] Phone: [phone] Fax: [phone]   Has the prescription been filled recently? No  Is the patient out of the medication? Yes  Has the patient been seen for an appointment in the last year OR does the patient have an upcoming appointment? Yes  Can we respond through MyChart? Yes  Agent: Please be advised that Rx refills may take up to 3 business days. We ask that you follow-up with your pharmacy.

## 2023-09-05 ENCOUNTER — Other Ambulatory Visit: Payer: Self-pay | Admitting: Family

## 2023-09-05 DIAGNOSIS — F32A Anxiety disorder, unspecified: Secondary | ICD-10-CM

## 2023-09-05 NOTE — Telephone Encounter (Signed)
 Spoke to Pt wife Cathera informed her that we sent in refill for Xanax  and she will call me back to schedule f/up appt to continue controlled substance refills

## 2023-09-06 DIAGNOSIS — E1042 Type 1 diabetes mellitus with diabetic polyneuropathy: Secondary | ICD-10-CM | POA: Diagnosis not present

## 2023-09-06 DIAGNOSIS — I152 Hypertension secondary to endocrine disorders: Secondary | ICD-10-CM | POA: Diagnosis not present

## 2023-09-06 DIAGNOSIS — E1069 Type 1 diabetes mellitus with other specified complication: Secondary | ICD-10-CM | POA: Diagnosis not present

## 2023-09-06 DIAGNOSIS — E785 Hyperlipidemia, unspecified: Secondary | ICD-10-CM | POA: Diagnosis not present

## 2023-09-18 DIAGNOSIS — E1042 Type 1 diabetes mellitus with diabetic polyneuropathy: Secondary | ICD-10-CM | POA: Diagnosis not present

## 2023-10-04 ENCOUNTER — Other Ambulatory Visit: Payer: Self-pay | Admitting: Family

## 2023-10-04 DIAGNOSIS — E1159 Type 2 diabetes mellitus with other circulatory complications: Secondary | ICD-10-CM

## 2023-10-04 NOTE — Telephone Encounter (Signed)
 Last Fill: 03/02/23  Last OV: 12/09/22 Next OV: 10/12/23  Routing to provider for review/authorization.

## 2023-10-04 NOTE — Telephone Encounter (Signed)
 Copied from CRM [id number]. Topic: Clinical - Medication Refill >> Oct 04, 2023  2:47 PM Dyann Glaser G wrote: Medication: losartan  (COZAAR ) 25 MG tablet   Has the patient contacted their pharmacy? No (Agent: If no, request that the patient contact the pharmacy for the refill. If patient does not wish to contact the pharmacy document the reason why and proceed with request.) (Agent: If yes, when and what did the pharmacy advise?)  This is the patient's preferred pharmacy:  [facility] [address] [place] - [address] [place] [address] Phone: [phone] Fax: [phone]  Is this the correct pharmacy for this prescription? Yes If no, delete pharmacy and type the correct one.   Has the prescription been filled recently? No  Is the patient out of the medication? Yes  Has the patient been seen for an appointment in the last year OR does the patient have an upcoming appointment? Yes  Can we respond through MyChart? Yes  Agent: Please be advised that Rx refills may take up to 3 business days. We ask that you follow-up with your pharmacy.

## 2023-10-05 ENCOUNTER — Telehealth: Payer: Self-pay

## 2023-10-05 ENCOUNTER — Other Ambulatory Visit: Payer: Self-pay

## 2023-10-05 ENCOUNTER — Encounter (HOSPITAL_COMMUNITY): Payer: Self-pay

## 2023-10-05 DIAGNOSIS — E1159 Type 2 diabetes mellitus with other circulatory complications: Secondary | ICD-10-CM

## 2023-10-05 MED ORDER — LOSARTAN POTASSIUM 25 MG PO TABS: 12.5000 mg | ORAL_TABLET | Freq: Every day | ORAL | 0 refills | Status: DC

## 2023-10-05 MED ORDER — LOSARTAN POTASSIUM 25 MG PO TABS: 12.5000 mg | ORAL_TABLET | Freq: Every day | ORAL | 3 refills | Status: DC

## 2023-10-05 NOTE — Telephone Encounter (Signed)
 Error

## 2023-10-05 NOTE — Telephone Encounter (Signed)
 Per Daivd Dub refilled Losartan  for 1 yr  Make separate encounter note for husband and refill losartan 

## 2023-10-12 ENCOUNTER — Ambulatory Visit: Payer: Self-pay | Admitting: Family

## 2023-10-12 ENCOUNTER — Encounter: Payer: Self-pay | Admitting: Family

## 2023-10-12 ENCOUNTER — Ambulatory Visit: Admitting: Family

## 2023-10-12 VITALS — BP 128/82 | HR 62 | Temp 97.9°F | Ht 65.0 in | Wt 178.9 lb

## 2023-10-12 DIAGNOSIS — Z794 Long term (current) use of insulin: Secondary | ICD-10-CM | POA: Diagnosis not present

## 2023-10-12 DIAGNOSIS — I10 Essential (primary) hypertension: Secondary | ICD-10-CM | POA: Diagnosis not present

## 2023-10-12 DIAGNOSIS — L989 Disorder of the skin and subcutaneous tissue, unspecified: Secondary | ICD-10-CM

## 2023-10-12 DIAGNOSIS — R7309 Other abnormal glucose: Secondary | ICD-10-CM

## 2023-10-12 DIAGNOSIS — E1042 Type 1 diabetes mellitus with diabetic polyneuropathy: Secondary | ICD-10-CM

## 2023-10-12 DIAGNOSIS — I25118 Atherosclerotic heart disease of native coronary artery with other forms of angina pectoris: Secondary | ICD-10-CM | POA: Diagnosis not present

## 2023-10-12 DIAGNOSIS — E1142 Type 2 diabetes mellitus with diabetic polyneuropathy: Secondary | ICD-10-CM | POA: Diagnosis not present

## 2023-10-12 LAB — CBC WITH DIFFERENTIAL/PLATELET
Basophils Absolute: 0 10*3/uL (ref 0.0–0.1)
Basophils Relative: 0.5 % (ref 0.0–3.0)
Eosinophils Absolute: 0.5 10*3/uL (ref 0.0–0.7)
Eosinophils Relative: 7.1 % — ABNORMAL HIGH (ref 0.0–5.0)
HCT: 38.6 % — ABNORMAL LOW (ref 39.0–52.0)
Hemoglobin: 13.4 g/dL (ref 13.0–17.0)
Lymphocytes Relative: 29.3 % (ref 12.0–46.0)
Lymphs Abs: 2 10*3/uL (ref 0.7–4.0)
MCHC: 34.8 g/dL (ref 30.0–36.0)
MCV: 97.2 fl (ref 78.0–100.0)
Monocytes Absolute: 0.4 10*3/uL (ref 0.1–1.0)
Monocytes Relative: 6.4 % (ref 3.0–12.0)
Neutro Abs: 3.8 10*3/uL (ref 1.4–7.7)
Neutrophils Relative %: 56.7 % (ref 43.0–77.0)
Platelets: 213 10*3/uL (ref 150.0–400.0)
RBC: 3.97 Mil/uL — ABNORMAL LOW (ref 4.22–5.81)
RDW: 14.3 % (ref 11.5–15.5)
WBC: 6.7 10*3/uL (ref 4.0–10.5)

## 2023-10-12 LAB — COMPREHENSIVE METABOLIC PANEL WITH GFR
ALT: 23 U/L (ref 0–53)
AST: 34 U/L (ref 0–37)
Albumin: 3.9 g/dL (ref 3.5–5.2)
Alkaline Phosphatase: 110 U/L (ref 39–117)
BUN: 20 mg/dL (ref 6–23)
CO2: 25 meq/L (ref 19–32)
Calcium: 8.8 mg/dL (ref 8.4–10.5)
Chloride: 104 meq/L (ref 96–112)
Creatinine, Ser: 1.25 mg/dL (ref 0.40–1.50)
GFR: 55.54 mL/min — ABNORMAL LOW (ref >60.00)
Glucose, Bld: 196 mg/dL — ABNORMAL HIGH (ref 70–99)
Potassium: 4 meq/L (ref 3.5–5.1)
Sodium: 136 meq/L (ref 135–145)
Total Bilirubin: 0.4 mg/dL (ref 0.2–1.2)
Total Protein: 6.8 g/dL (ref 6.0–8.3)

## 2023-10-12 LAB — MICROALBUMIN / CREATININE URINE RATIO
Creatinine,U: 105.2 mg/dL
Microalb Creat Ratio: 92.1 mg/g — ABNORMAL HIGH (ref 0.0–30.0)
Microalb, Ur: 9.7 mg/dL — ABNORMAL HIGH (ref 0.0–1.9)

## 2023-10-12 MED ORDER — CLOPIDOGREL BISULFATE 75 MG PO TABS: 75.0000 mg | ORAL_TABLET | Freq: Every day | ORAL | 1 refills | Status: AC

## 2023-10-12 MED ORDER — GABAPENTIN 100 MG PO CAPS
100.0000 mg | ORAL_CAPSULE | Freq: Every day | ORAL | 3 refills | Status: AC
Start: 2023-10-12 — End: ?

## 2023-10-12 NOTE — Assessment & Plan Note (Signed)
 Chronic, stable.  Reviewed Dr Vera Gip note. continue aspirin  , plavix .

## 2023-10-12 NOTE — Patient Instructions (Signed)
 As discussed, I am concerned for left hand lesion.  Referral dermatology has been placed please call and make an appointment to be evaluated.   [facility] [phone])    Let me know of any issues in getting scheduled

## 2023-10-12 NOTE — Assessment & Plan Note (Addendum)
 Dizziness resolved.  He is no longer on Toprol  25 mg per cardiology.  Continue losartan  12.5 mg for renal protection

## 2023-10-12 NOTE — Progress Notes (Signed)
 Assessment & Plan:   Elevated glucose -     Microalbumin / creatinine urine ratio  Coronary artery disease of native artery of native heart with stable angina pectoris [facility]) Assessment & Plan: Chronic, stable.  Reviewed Dr Vera Gip note. continue aspirin  , plavix .   Orders: -     Clopidogrel  Bisulfate; Take 1 tablet (75 mg total) by mouth daily.  Dispense: 90 tablet; Refill: 1  Type 2 diabetes mellitus with diabetic polyneuropathy, without long-term current use of insulin  (HCC) -     Gabapentin ; Take 1 capsule (100 mg total) by mouth daily.  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  He is no longer on Toprol  25 mg per cardiology.  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  Continue gabapentin  100 mg daily.  Refrain from escalating dose due to previous history of sedation/confusion on higher doses of gabapentin .      Return precautions given.   Risks, benefits, and alternatives of the medications and treatment plan prescribed today were discussed, and patient expressed understanding.   Education regarding symptom management and diagnosis given to patient on AVS either electronically or printed.  No follow-ups on file.  Bascom Bossier, FNP  Subjective:    Patient ID: Todd Clay, male    DOB: 08/30/1945, 78 y.o.   MRN: [id number]  CC: Todd Clay is a 78 y.o. male who presents today for follow up.   HPI: Accompanied by wife  No new complaints today.  He is overall feeling well  He has decreased tresiba to 10 units    Dizziness has resolved after ablation this  past Fall. No recent falls.   He may take torsemide  20mg  every 3 days/wk.   No sob, leg swelling  No recent f/u dermatology.  Scratchy slightly tender lesion dorsal aspect left hand  Last seen by Dr. Shelvy Dickens 09/06/2023 DM 1, A1c 6.6 Decreased Tresiba to 11 units Seen by [facility] heart failure clinic 06/27/2023 03/27/2023: PVC ablation by Dr. Ewing Holiday  Repeat echocardiogram by Dr Hollis Lurie  Follow-up 06/09/2023 Dr Hollis Lurie bradycardia, frequent PVCs, labile hypertension, coronary artery disease, heart failure reduced ejection fraction ;  He is off of metoprolol  d/t fatigue  Allergies: Patient has no known allergies. Current Outpatient Medications on File Prior to Visit  Medication Sig Dispense Refill   ALPRAZolam  (XANAX ) 0.25 MG tablet TAKE 1 TABLET BY MOUTH ONCE DAILY AS NEEDED FOR ANXIETY 30 tablet 1   aspirin  EC 81 MG tablet Take 81 mg by mouth daily. Swallow whole.     atorvastatin  (LIPITOR) 80 MG tablet Take 1 tablet by mouth once daily 90 tablet 3   blood glucose meter kit and supplies Dispense based on patient and insurance preference. Use up to four times daily as directed. (FOR ICD-10 E10.9, E11.9). 1 each 0   Continuous Blood Gluc Receiver (FREESTYLE LIBRE 14 DAY READER) DEVI USE TO TEST BLOOD SUGAR     Continuous Glucose Sensor (DEXCOM G6 SENSOR) MISC by Does not apply route.     Continuous Glucose Sensor (DEXCOM G7 SENSOR) MISC Insert one sensor every 10 days . 3 each  3   insulin  regular (NOVOLIN R RELION) 100 units/mL injection Inject 0.06 mLs (6 Units total) into the skin 3 (three) times daily before meals. (Patient taking differently: Inject 4-6 Units into the skin 3 (three) times daily before meals. Sliding Scale) 10 mL 2   losartan  (COZAAR ) 25 MG tablet Take 0.5 tablets (12.5 mg total) by mouth daily. 45 tablet 3   mupirocin  ointment (BACTROBAN ) 2 % Apply 1 application  topically 2 (two) times daily. 22 g 2   torsemide  (DEMADEX ) 20 MG tablet Take 20 mg by mouth as needed. 12/09/22:  reports during [facility] call this was added at time of hospital discharge from [facility] on 12/07/22: instructed to take prn for weight gain > 3-5 lbs overnight     TRESIBA FLEXTOUCH 100 UNIT/ML SOPN FlexTouch Pen Inject 12 Units into the skin daily.  3   [DISCONTINUED] carvedilol (COREG) 6.25 MG tablet Take 6.25 mg by mouth 2 (two) times daily.     No current facility-administered medications on file prior to visit.    Review of Systems    Objective:    BP 128/82   Pulse 62   Temp 97.9 F (36.6 C) (Oral)   Ht 5\' 5"  (1.651 m)   Wt 178 lb 14.4 oz (81.1 kg)   SpO2 97%   BMI 29.77 kg/m  BP Readings from Last 3 Encounters:  10/12/23 128/82  12/20/22 116/78  12/09/22 (!) 110/50   Wt Readings from Last 3 Encounters:  10/12/23 178 lb 14.4 oz (81.1 kg)  12/20/22 176 lb 9.6 oz (80.1 kg)  12/09/22 182 lb 12 oz (82.9 kg)    Physical Exam Vitals reviewed.  Constitutional:      Appearance: He is well-developed.  Cardiovascular:     Rate and Rhythm: Regular rhythm.     Heart sounds: Normal heart sounds.  Pulmonary:     Effort: Pulmonary effort is normal. No respiratory distress.     Breath sounds: Normal breath sounds. No wheezing, rhonchi or rales.  Skin:    General: Skin is warm and dry.          Comments: Two flaky, nontender areas dorsal aspect of skin.  Neurological:     Mental Status: He is alert.  Psychiatric:        Speech: Speech normal.        Behavior: Behavior normal.

## 2023-10-12 NOTE — Assessment & Plan Note (Signed)
 Chronic, stable.  Continue gabapentin  100 mg daily.  Refrain from escalating dose due to previous history of sedation/confusion on higher doses of gabapentin .

## 2023-10-12 NOTE — Assessment & Plan Note (Signed)
 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

## 2023-10-17 ENCOUNTER — Encounter (INDEPENDENT_AMBULATORY_CARE_PROVIDER_SITE_OTHER): Payer: Self-pay

## 2023-10-20 ENCOUNTER — Other Ambulatory Visit: Payer: Self-pay

## 2023-10-20 DIAGNOSIS — R899 Unspecified abnormal finding in specimens from other organs, systems and tissues: Secondary | ICD-10-CM

## 2023-10-20 DIAGNOSIS — E1159 Type 2 diabetes mellitus with other circulatory complications: Secondary | ICD-10-CM

## 2023-10-20 MED ORDER — LOSARTAN POTASSIUM 25 MG PO TABS: 25.0000 mg | ORAL_TABLET | Freq: Every day | ORAL | 1 refills | Status: DC

## 2023-10-25 ENCOUNTER — Telehealth: Payer: Self-pay | Admitting: Family

## 2023-10-25 NOTE — Telephone Encounter (Signed)
 Patient note states BNP, there is only a order for a BMP. Need lab order for the BNP

## 2023-10-31 ENCOUNTER — Other Ambulatory Visit (INDEPENDENT_AMBULATORY_CARE_PROVIDER_SITE_OTHER)

## 2023-10-31 ENCOUNTER — Other Ambulatory Visit

## 2023-10-31 DIAGNOSIS — R899 Unspecified abnormal finding in specimens from other organs, systems and tissues: Secondary | ICD-10-CM | POA: Diagnosis not present

## 2023-11-01 ENCOUNTER — Ambulatory Visit: Payer: Self-pay | Admitting: Family

## 2023-11-01 LAB — BASIC METABOLIC PANEL WITH GFR
BUN: 22 mg/dL (ref 6–23)
CO2: 27 meq/L (ref 19–32)
Calcium: 9.1 mg/dL (ref 8.4–10.5)
Chloride: 103 meq/L (ref 96–112)
Creatinine, Ser: 1.27 mg/dL (ref 0.40–1.50)
GFR: 54.47 mL/min — ABNORMAL LOW (ref >60.00)
Glucose, Bld: 160 mg/dL — ABNORMAL HIGH (ref 70–99)
Potassium: 4.9 meq/L (ref 3.5–5.1)
Sodium: 139 meq/L (ref 135–145)

## 2023-11-03 ENCOUNTER — Other Ambulatory Visit (INDEPENDENT_AMBULATORY_CARE_PROVIDER_SITE_OTHER): Payer: Self-pay | Admitting: Vascular Surgery

## 2023-11-03 DIAGNOSIS — I6523 Occlusion and stenosis of bilateral carotid arteries: Secondary | ICD-10-CM

## 2023-11-07 ENCOUNTER — Ambulatory Visit (INDEPENDENT_AMBULATORY_CARE_PROVIDER_SITE_OTHER): Payer: Medicare PPO | Admitting: Vascular Surgery

## 2023-11-07 ENCOUNTER — Encounter (INDEPENDENT_AMBULATORY_CARE_PROVIDER_SITE_OTHER): Payer: Medicare PPO

## 2023-12-08 ENCOUNTER — Encounter (INDEPENDENT_AMBULATORY_CARE_PROVIDER_SITE_OTHER)

## 2023-12-08 ENCOUNTER — Ambulatory Visit (INDEPENDENT_AMBULATORY_CARE_PROVIDER_SITE_OTHER): Admitting: Nurse Practitioner

## 2023-12-17 DIAGNOSIS — E1042 Type 1 diabetes mellitus with diabetic polyneuropathy: Secondary | ICD-10-CM | POA: Diagnosis not present

## 2024-01-10 DIAGNOSIS — E1042 Type 1 diabetes mellitus with diabetic polyneuropathy: Secondary | ICD-10-CM | POA: Diagnosis not present

## 2024-01-10 DIAGNOSIS — E1069 Type 1 diabetes mellitus with other specified complication: Secondary | ICD-10-CM | POA: Diagnosis not present

## 2024-01-10 DIAGNOSIS — E785 Hyperlipidemia, unspecified: Secondary | ICD-10-CM | POA: Diagnosis not present

## 2024-01-10 DIAGNOSIS — I152 Hypertension secondary to endocrine disorders: Secondary | ICD-10-CM | POA: Diagnosis not present

## 2024-01-15 ENCOUNTER — Other Ambulatory Visit: Payer: Self-pay | Admitting: Family

## 2024-01-15 DIAGNOSIS — I25118 Atherosclerotic heart disease of native coronary artery with other forms of angina pectoris: Secondary | ICD-10-CM

## 2024-01-30 ENCOUNTER — Other Ambulatory Visit: Payer: Self-pay | Admitting: Family

## 2024-01-30 DIAGNOSIS — E1159 Type 2 diabetes mellitus with other circulatory complications: Secondary | ICD-10-CM

## 2024-01-31 DIAGNOSIS — L570 Actinic keratosis: Secondary | ICD-10-CM | POA: Diagnosis not present

## 2024-01-31 DIAGNOSIS — D2261 Melanocytic nevi of right upper limb, including shoulder: Secondary | ICD-10-CM | POA: Diagnosis not present

## 2024-01-31 DIAGNOSIS — D2262 Melanocytic nevi of left upper limb, including shoulder: Secondary | ICD-10-CM | POA: Diagnosis not present

## 2024-01-31 DIAGNOSIS — D225 Melanocytic nevi of trunk: Secondary | ICD-10-CM | POA: Diagnosis not present

## 2024-03-01 ENCOUNTER — Ambulatory Visit: Payer: Self-pay | Admitting: *Deleted

## 2024-03-01 NOTE — Telephone Encounter (Signed)
 Spoke to pt wife and informed her that he is NOT suppose to be taking the Metoprolol 

## 2024-03-01 NOTE — Telephone Encounter (Signed)
 FYI Only or Action Required?: Action required by provider: clinical question for provider.  Patient was last seen in primary care on 10/12/2023 by Dineen Rollene MATSU, FNP.  Called Nurse Triage reporting Medication Problem.  Symptoms began na.  Interventions attempted: Other: na.  Symptoms are: na.  Triage Disposition: Call PCP Now  Patient/caregiver understands and will follow disposition?: yes   Reason for Disposition  [1] Caller has URGENT medicine question about med that primary care doctor (or NP/PA) or specialist prescribed AND [2] triager unable to answer question  Answer Assessment - Initial Assessment Questions 1. NAME of MEDICINE: What medicine(s) are you calling about?     Metoprolol  2. QUESTION: What is your question? (e.g., double dose of medicine, side effect)     Wife needs to know if patient needs to RF metoprolol  3. PRESCRIBER: Who prescribed the medicine? Reason: if prescribed by specialist, call should be referred to that group.     [place] 4. SYMPTOMS: Do you have any symptoms? If Yes, ask: What symptoms are you having?  How bad are the symptoms (e.g., mild, moderate, severe)      None- ran out- he has been taking it and just ran out.  Wife is concerned because she did not know he had continued the medication. Patient is not having symptoms at this time and will await PCP call back for verification. Medication was discontinued at visit 10/12/23 with PCP.  Protocols used: Medication Question Call-A-AH   Copied from CRM #[id number]. Topic: Clinical - Medication Question >> Mar 01, 2024  4:15 PM Rea C wrote: Reason for CRM: Patient's spouse called in to inquire about whether patient should still be taking metropol or not. She stated he is out of the medication and wanting a refill. But, they aren't sure if he should still be taking it.   [phone](f)

## 2024-03-12 ENCOUNTER — Other Ambulatory Visit: Payer: Self-pay | Admitting: Family

## 2024-03-12 DIAGNOSIS — E1159 Type 2 diabetes mellitus with other circulatory complications: Secondary | ICD-10-CM

## 2024-03-12 NOTE — Telephone Encounter (Signed)
 Called pt his wife answered the phone she stated he is taking 25MG  but he breaks it it half, she also stated you were the one who gave him permission to cut it half.

## 2024-03-16 DIAGNOSIS — E1042 Type 1 diabetes mellitus with diabetic polyneuropathy: Secondary | ICD-10-CM | POA: Diagnosis not present

## 2024-03-22 DIAGNOSIS — I493 Ventricular premature depolarization: Secondary | ICD-10-CM | POA: Diagnosis not present

## 2024-03-22 DIAGNOSIS — I5189 Other ill-defined heart diseases: Secondary | ICD-10-CM | POA: Diagnosis not present

## 2024-03-22 DIAGNOSIS — I5020 Unspecified systolic (congestive) heart failure: Secondary | ICD-10-CM | POA: Diagnosis not present

## 2024-04-23 ENCOUNTER — Other Ambulatory Visit: Payer: Self-pay | Admitting: Family

## 2024-04-23 DIAGNOSIS — I25118 Atherosclerotic heart disease of native coronary artery with other forms of angina pectoris: Secondary | ICD-10-CM
